# Patient Record
Sex: Female | Born: 1940
Health system: Southern US, Community
[De-identification: ages and names within clinical notes are randomized; demographics above are authoritative.]

## PROBLEM LIST (undated history)

## (undated) DIAGNOSIS — T8859XA Other complications of anesthesia, initial encounter: Secondary | ICD-10-CM

## (undated) DIAGNOSIS — M797 Fibromyalgia: Secondary | ICD-10-CM

## (undated) DIAGNOSIS — M1990 Unspecified osteoarthritis, unspecified site: Secondary | ICD-10-CM

## (undated) DIAGNOSIS — E119 Type 2 diabetes mellitus without complications: Secondary | ICD-10-CM

## (undated) DIAGNOSIS — E039 Hypothyroidism, unspecified: Secondary | ICD-10-CM

## (undated) DIAGNOSIS — R112 Nausea with vomiting, unspecified: Secondary | ICD-10-CM

## (undated) DIAGNOSIS — Z9889 Other specified postprocedural states: Secondary | ICD-10-CM

## (undated) DIAGNOSIS — C801 Malignant (primary) neoplasm, unspecified: Secondary | ICD-10-CM

## (undated) DIAGNOSIS — E7800 Pure hypercholesterolemia, unspecified: Secondary | ICD-10-CM

## (undated) HISTORY — PX: BACK SURGERY: SHX140

## (undated) HISTORY — PX: CHOLECYSTECTOMY: SHX55

## (undated) HISTORY — PX: TUBAL LIGATION: SHX77

## (undated) HISTORY — PX: LAPAROSCOPIC UNILATERAL SALPINGO OOPHERECTOMY: SHX5935

---

## 1997-09-24 ENCOUNTER — Ambulatory Visit (HOSPITAL_COMMUNITY): Admission: RE | Admit: 1997-09-24 | Discharge: 1997-09-24 | Payer: Self-pay | Admitting: Obstetrics and Gynecology

## 1998-02-19 ENCOUNTER — Other Ambulatory Visit: Admission: RE | Admit: 1998-02-19 | Discharge: 1998-02-19 | Payer: Self-pay | Admitting: Obstetrics and Gynecology

## 1998-08-26 ENCOUNTER — Ambulatory Visit (HOSPITAL_COMMUNITY): Admission: RE | Admit: 1998-08-26 | Discharge: 1998-08-26 | Payer: Self-pay | Admitting: Gastroenterology

## 1999-04-14 ENCOUNTER — Other Ambulatory Visit: Admission: RE | Admit: 1999-04-14 | Discharge: 1999-04-14 | Payer: Self-pay | Admitting: Obstetrics and Gynecology

## 1999-11-10 ENCOUNTER — Encounter: Admission: RE | Admit: 1999-11-10 | Discharge: 1999-11-10 | Payer: Self-pay | Admitting: Family Medicine

## 1999-11-10 ENCOUNTER — Encounter: Payer: Self-pay | Admitting: Family Medicine

## 1999-11-25 ENCOUNTER — Encounter: Admission: RE | Admit: 1999-11-25 | Discharge: 1999-11-25 | Payer: Self-pay | Admitting: Family Medicine

## 1999-11-25 ENCOUNTER — Encounter: Payer: Self-pay | Admitting: Family Medicine

## 1999-12-04 ENCOUNTER — Encounter: Admission: RE | Admit: 1999-12-04 | Discharge: 1999-12-04 | Payer: Self-pay | Admitting: Family Medicine

## 1999-12-04 ENCOUNTER — Encounter: Payer: Self-pay | Admitting: Family Medicine

## 2000-04-13 ENCOUNTER — Other Ambulatory Visit: Admission: RE | Admit: 2000-04-13 | Discharge: 2000-04-13 | Payer: Self-pay | Admitting: Obstetrics and Gynecology

## 2000-04-28 ENCOUNTER — Encounter: Admission: RE | Admit: 2000-04-28 | Discharge: 2000-04-28 | Payer: Self-pay | Admitting: Family Medicine

## 2000-04-28 ENCOUNTER — Encounter: Payer: Self-pay | Admitting: Family Medicine

## 2000-06-08 ENCOUNTER — Encounter: Payer: Self-pay | Admitting: Orthopedic Surgery

## 2000-06-08 ENCOUNTER — Encounter: Admission: RE | Admit: 2000-06-08 | Discharge: 2000-06-08 | Payer: Self-pay | Admitting: Orthopedic Surgery

## 2001-07-22 ENCOUNTER — Encounter: Admission: RE | Admit: 2001-07-22 | Discharge: 2001-07-22 | Payer: Self-pay | Admitting: Otolaryngology

## 2001-07-22 ENCOUNTER — Encounter: Payer: Self-pay | Admitting: Otolaryngology

## 2002-02-23 ENCOUNTER — Encounter: Payer: Self-pay | Admitting: Obstetrics and Gynecology

## 2002-02-23 ENCOUNTER — Encounter: Admission: RE | Admit: 2002-02-23 | Discharge: 2002-02-23 | Payer: Self-pay | Admitting: Obstetrics and Gynecology

## 2002-03-21 ENCOUNTER — Other Ambulatory Visit: Admission: RE | Admit: 2002-03-21 | Discharge: 2002-03-21 | Payer: Self-pay | Admitting: Obstetrics and Gynecology

## 2002-04-03 ENCOUNTER — Encounter: Admission: RE | Admit: 2002-04-03 | Discharge: 2002-04-03 | Payer: Self-pay | Admitting: Obstetrics and Gynecology

## 2002-04-03 ENCOUNTER — Encounter: Payer: Self-pay | Admitting: Obstetrics and Gynecology

## 2003-07-11 ENCOUNTER — Other Ambulatory Visit: Admission: RE | Admit: 2003-07-11 | Discharge: 2003-07-11 | Payer: Self-pay | Admitting: Obstetrics and Gynecology

## 2003-07-19 ENCOUNTER — Encounter: Admission: RE | Admit: 2003-07-19 | Discharge: 2003-07-19 | Payer: Self-pay | Admitting: Obstetrics and Gynecology

## 2004-09-19 ENCOUNTER — Encounter (INDEPENDENT_AMBULATORY_CARE_PROVIDER_SITE_OTHER): Payer: Self-pay | Admitting: Specialist

## 2004-09-19 ENCOUNTER — Ambulatory Visit (HOSPITAL_COMMUNITY): Admission: RE | Admit: 2004-09-19 | Discharge: 2004-09-19 | Payer: Self-pay | Admitting: *Deleted

## 2005-03-30 ENCOUNTER — Encounter: Admission: RE | Admit: 2005-03-30 | Discharge: 2005-03-30 | Payer: Self-pay | Admitting: Family Medicine

## 2006-04-15 ENCOUNTER — Encounter: Admission: RE | Admit: 2006-04-15 | Discharge: 2006-04-15 | Payer: Self-pay | Admitting: Family Medicine

## 2006-08-06 ENCOUNTER — Encounter: Admission: RE | Admit: 2006-08-06 | Discharge: 2006-08-06 | Payer: Self-pay | Admitting: Family Medicine

## 2007-11-16 ENCOUNTER — Encounter: Admission: RE | Admit: 2007-11-16 | Discharge: 2007-11-16 | Payer: Self-pay | Admitting: Obstetrics and Gynecology

## 2008-08-08 ENCOUNTER — Encounter: Admission: RE | Admit: 2008-08-08 | Discharge: 2008-09-24 | Payer: Self-pay | Admitting: Orthopedic Surgery

## 2009-02-20 ENCOUNTER — Ambulatory Visit (HOSPITAL_BASED_OUTPATIENT_CLINIC_OR_DEPARTMENT_OTHER): Admission: RE | Admit: 2009-02-20 | Discharge: 2009-02-20 | Payer: Self-pay | Admitting: Orthopaedic Surgery

## 2009-02-20 ENCOUNTER — Ambulatory Visit: Payer: Self-pay | Admitting: Diagnostic Radiology

## 2009-06-08 ENCOUNTER — Encounter: Admission: RE | Admit: 2009-06-08 | Discharge: 2009-06-08 | Payer: Self-pay | Admitting: Orthopaedic Surgery

## 2009-08-20 ENCOUNTER — Encounter: Admission: RE | Admit: 2009-08-20 | Discharge: 2009-11-18 | Payer: Self-pay | Admitting: Orthopaedic Surgery

## 2010-01-22 ENCOUNTER — Encounter: Admission: RE | Admit: 2010-01-22 | Discharge: 2010-01-22 | Payer: Self-pay | Admitting: Obstetrics and Gynecology

## 2010-04-06 ENCOUNTER — Encounter: Payer: Self-pay | Admitting: Family Medicine

## 2010-08-01 NOTE — Op Note (Signed)
NAMEGENICE, Ashley NO.:  [id number]   MEDICAL RECORD NO.:  [id number]          PATIENT TYPE:  AMB   LOCATION:  DAY                          FACILITY:  [facility]   PHYSICIAN:  Vikki Ports, MDDATE OF BIRTH:  1940-11-18   DATE OF PROCEDURE:  09/19/2004  DATE OF DISCHARGE:                                 OPERATIVE REPORT   PREOPERATIVE DIAGNOSIS:  Symptomatic cholelithiasis with normal liver  function tests.   POSTOPERATIVE DIAGNOSIS:  Symptomatic cholelithiasis with normal liver  function tests.   OPERATION/PROCEDURE:  Laparoscopic cholecystectomy.   SURGEON:  Vikki Ports, M.D.   ASSISTANT:  Clovis Pu. Cornett, M.D.   ANESTHESIA:  General.   DESCRIPTION OF PROCEDURE:  The patient was taken to the operating room and  placed in the supine position. After adequate general anesthesia was induced  using the endotracheal tube, the abdomen was prepped and draped in the  normal sterile fashion.  Using a transverse infraumbilical incision, I  dissected down to the fascia.  The fascia was opened vertically.  An 0  Vicryl pursestring suture was placed around the fascial defect.  The Hasson  trocar was placed in the abdomen and the abdomen was insufflated with  continuous flow carbon dioxide.  Under direct visualization an 11 mm port  was placed in the subxiphoid region.  Two 5 mm ports were placed in the  right abdomen.  Gallbladder was identified and retracted cephalad.  Dissection at the neck of the gallbladder easily visualized the cystic duct  and its junction with the gallbladder and common duct.  It was triply  clipped and divided.  Cystic artery was dissected in a similar fashion  creating a good window posterior to it.  It was triply clipped and divided.  Gallbladder was taken off the gallbladder bed using Bovie electrocautery and  removed through the umbilical port. Adequate hemostasis was assured.  The  fascia was closed  with an 0 Vicryl  pursestring suture.  Skin incisions were  closed with subcuticular 4-0 Monocryl. Steri-Strips and sterile dressings  were applied.  The patient tolerated the procedure well and went to PACU in  good condition.       KRH/MEDQ  D:  09/19/2004  T:  09/19/2004  Job:  [id number]

## 2010-12-13 ENCOUNTER — Observation Stay (HOSPITAL_COMMUNITY)
Admission: EM | Admit: 2010-12-13 | Discharge: 2010-12-13 | Disposition: A | Discharge status: Home / Self Care | Payer: Medicare Other | Attending: Internal Medicine | Admitting: Internal Medicine

## 2010-12-13 ENCOUNTER — Emergency Department (HOSPITAL_COMMUNITY): Payer: Medicare Other

## 2010-12-13 DIAGNOSIS — Z79899 Other long term (current) drug therapy: Secondary | ICD-10-CM | POA: Insufficient documentation

## 2010-12-13 DIAGNOSIS — F411 Generalized anxiety disorder: Secondary | ICD-10-CM | POA: Insufficient documentation

## 2010-12-13 DIAGNOSIS — R079 Chest pain, unspecified: Secondary | ICD-10-CM | POA: Insufficient documentation

## 2010-12-13 DIAGNOSIS — R112 Nausea with vomiting, unspecified: Principal | ICD-10-CM | POA: Insufficient documentation

## 2010-12-13 DIAGNOSIS — E039 Hypothyroidism, unspecified: Secondary | ICD-10-CM | POA: Insufficient documentation

## 2010-12-13 DIAGNOSIS — E119 Type 2 diabetes mellitus without complications: Secondary | ICD-10-CM | POA: Insufficient documentation

## 2010-12-13 DIAGNOSIS — A088 Other specified intestinal infections: Secondary | ICD-10-CM | POA: Insufficient documentation

## 2010-12-13 DIAGNOSIS — R002 Palpitations: Secondary | ICD-10-CM | POA: Insufficient documentation

## 2010-12-13 DIAGNOSIS — E785 Hyperlipidemia, unspecified: Secondary | ICD-10-CM | POA: Insufficient documentation

## 2010-12-13 DIAGNOSIS — K219 Gastro-esophageal reflux disease without esophagitis: Secondary | ICD-10-CM | POA: Insufficient documentation

## 2010-12-13 LAB — GLUCOSE, CAPILLARY
Glucose-Capillary: 165 mg/dL — ABNORMAL HIGH (ref 70–99)
Glucose-Capillary: 127 mg/dL — ABNORMAL HIGH (ref 70–99)
Glucose-Capillary: 126 mg/dL — ABNORMAL HIGH (ref 70–99)

## 2010-12-13 LAB — HEPATIC FUNCTION PANEL
AST: 31 U/L (ref 0–37)
Albumin: 4 g/dL (ref 3.5–5.2)
Total Protein: 7.1 g/dL (ref 6.0–8.3)

## 2010-12-13 LAB — POCT I-STAT, CHEM 8
BUN: 16 mg/dL (ref 6–23)
HCT: 39 % (ref 36.0–46.0)
Sodium: 136 mEq/L (ref 135–145)
TCO2: 22 mmol/L (ref 0–100)

## 2010-12-13 LAB — DIFFERENTIAL
Basophils Absolute: 0 10*3/uL (ref 0.0–0.1)
Basophils Relative: 0 % (ref 0–1)
Eosinophils Absolute: 0 10*3/uL (ref 0.0–0.7)
Eosinophils Relative: 0 % (ref 0–5)
Lymphocytes Relative: 16 % (ref 12–46)

## 2010-12-13 LAB — URINE MICROSCOPIC-ADD ON

## 2010-12-13 LAB — URINALYSIS, ROUTINE W REFLEX MICROSCOPIC
Ketones, ur: >80 mg/dL — AB
Protein, ur: NEGATIVE mg/dL
Urobilinogen, UA: 0.2 mg/dL (ref 0.0–1.0)

## 2010-12-13 LAB — CK TOTAL AND CKMB (NOT AT ARMC)
CK, MB: 9.3 ng/mL (ref 0.3–4.0)
CK, MB: 8.1 ng/mL (ref 0.3–4.0)
Relative Index: 3 — ABNORMAL HIGH (ref 0.0–2.5)
Relative Index: 2.9 — ABNORMAL HIGH (ref 0.0–2.5)
Total CK: 336 U/L — ABNORMAL HIGH (ref 7–177)
Total CK: 263 U/L — ABNORMAL HIGH (ref 7–177)

## 2010-12-13 LAB — LIPASE, BLOOD: Lipase: 18 U/L (ref 11–59)

## 2010-12-13 LAB — CBC
HCT: 38.6 % (ref 36.0–46.0)
MCHC: 35 g/dL (ref 30.0–36.0)
Platelets: 212 10*3/uL (ref 150–400)
RDW: 12.5 % (ref 11.5–15.5)
WBC: 7.6 10*3/uL (ref 4.0–10.5)

## 2010-12-13 LAB — POCT I-STAT TROPONIN I: Troponin i, poc: 0 ng/mL (ref 0.00–0.08)

## 2010-12-13 LAB — TROPONIN I: Troponin I: <0.3 ng/mL (ref <0.30)

## 2010-12-14 LAB — URINE CULTURE: Culture  Setup Time: 201209291128

## 2010-12-21 NOTE — H&P (Signed)
NAMEMarland Kitchen  Ashley Perkins, [name]:  [id number]  MEDICAL RECORD NO.:  [id number]  LOCATION:  WLED                         FACILITY:  [facility]  PHYSICIAN:  Hollice Espy, M.D.DATE OF BIRTH:  01-Aug-1940  DATE OF ADMISSION:  12/13/2010 DATE OF DISCHARGE:                             HISTORY & PHYSICAL   ATTENDING PHYSICIAN:  Hollice Espy, M.D.  PCP:  Duncan Dull, M.D.  CHIEF COMPLAINT:  Nausea, vomiting.  HISTORY OF PRESENT ILLNESS:  The patient is a 70 year old white female with past medical history of hypothyroidism and diabetes mellitus type 2 who was in her usual state of health when in the evening of December 12, 2010, when she sat down for dinner and she had a couple of bites, immediately started not feeling well.  She said she felt sense of nausea, diaphoresis, abdominal discomfort and she had brief episode of chest tightness.  She felt like her heart was racing.  Her symptoms persisted.  She could not get the nausea sensation to cease.  She even tried to make herself threw up and even when after she did so, her symptoms persisted.  She came to the emergency room to be evaluated. Chest x-ray, EKG, lab work was all unremarkable, but because of her symptoms and other risk factors, it was felt best that she be come in for observation to rule out any type of cardiac issue.  When I saw the patient, she was feeling better.  She said her nausea had passed, she felt more tired than anything else.  She denied any headaches, vision changes, dysphagia.  No current chest pain, palpitations, shortness of breath, wheeze, cough, abdominal pain, hematuria, dysuria, constipation, diarrhea, focal sign, numbness, weakness, or pain on review of systems currently, otherwise negative.  PAST MEDICAL HISTORY:  Includes 1. Hypothyroidism. 2. Hypertension. 3. Hyperlipidemia.  MEDICATIONS:  She is on: 1. Synthroid 50. 2. Metformin 1000 in the morning, 500 in the  evening. 3. Glipizide 5 mg p.o. daily. 4. She is on Crestor. 5. She may be on one other medication she cannot remember.  ALLERGIES:  She has no known drug allergies.  SOCIAL HISTORY:  She denies any tobacco, rarely drinks, no drug use.  FAMILY HISTORY:  Noted for dementia.  PHYSICAL EXAM:  VITAL SIGNS:  The patient's vitals when she came in to the emergency room; temperature 97.7, blood pressure initially 163/92, then down to 127/72, respirations 16, pulse 74, O2 sat 100% on room air. GENERAL:  She is alert and oriented x3, in no apparent distress. HEENT:  Normocephalic, atraumatic.  Mucous membranes are slightly dry. She has no carotid bruits. HEART:  Regular rate and rhythm.  S1 and S2. LUNGS:  Clear to auscultation bilaterally. ABDOMEN:  Soft, slightly distended, nontender.  Hypoactive bowel sounds. EXTREMITIES:  Show no clubbing or cyanosis, trace pitting edema.  LAB WORK:  CPK 336 and MB is 9.3, troponin-I 0.0.  Sodium 136, potassium 3.2, chloride 101, bicarb 22, BUN 16, creatinine 0.9, glucose 177. White count 7.6, 79% neutrophils, H and H 13.5 and 39, MCV 91, platelet count 212.  Transaminases are normal.  Lipase is normal. Urinalysis notes greater than 80 of ketones,  small leukocytes.  There are only 3-6 white cells and rare bacteria.  EKG shows essentially normal sinus rhythm with borderline prolonged QT.  ASSESSMENT AND PLAN: 1. Nausea, vomiting, some chest tightness.  It is possible this could     have been a passing stomach gastroenteritis.  However given her     age, diabetes, and the fact that she is woman, concerning to rule     out any type of acute coronary syndrome.  We will plan to bringing     to the hospital for observation, check 3 sets total of cardiac     markers for a set has already been negative, keep her on Telemetry.     If  she is feeling better and she has no abnormal lab work, we will     plan to discharge this evening after her third set of  enzymes. 2. Diabetes mellitus.  Continue metformin.  We will hold on glipizide     at this time and add a sliding scale. 3. Hypothyroidism.  Continue Synthroid.  All diagnoses are present on admission.     Hollice Espy, M.D.     SKK/MEDQ  D:  12/13/2010  T:  12/13/2010  Job:  [id number]  cc:   Duncan Dull, M.D. Fax: [phone]  Electronically Signed by Virginia Rochester M.D. on 12/21/2010 05:33:26 PM

## 2010-12-21 NOTE — Discharge Summary (Signed)
  Ashley Perkins, [name]:  [id number]  MEDICAL RECORD NO.:  [id number]  LOCATION:  1407                         FACILITY:  [facility]  PHYSICIAN:  Hollice Espy, M.D.DATE OF BIRTH:  12/05/1940  DATE OF ADMISSION:  12/13/2010 DATE OF DISCHARGE:  12/13/2010                              DISCHARGE SUMMARY   ATTENDING PHYSICIAN:  Hollice Espy, M.D.  PRIMARY CARE PHYSICIAN:  Duncan Dull, M.D.  DISCHARGE DIAGNOSES: 1. Viral gastroenteritis. 2. Gastroesophageal reflux disease. 3. History diabetes. 4. History of hyperlipidemia. 5. History of hypothyroidism. 6. History of anxiety.  DISCHARGE MEDICATIONS:  The patient will continue on all of her previous medications. 1. Amitriptyline 50 p.o. q.h.s. 2. Cranberry tab, 1 tab p.o. daily. 3. Flexeril 10 p.o. q.h.s. 4. Glipizide 10 mg 6 hours daily. 5. Synthroid 50 mcg p.o. daily. 6. Lipitor 20 mg p.o. daily. 7. Metformin 1000 in the morning, 500 in the evening. 8. Multivitamin p.o. daily. 9. Xanax 0.5 p.o. q.h.s.  NEW MEDICINES: 1. Prilosec over-the-counter 1 p.o. daily. 2. Maalox 30 cc every 6 hours as needed.  HOSPITAL COURSE:  The patient is a 70 year old white female with history of hypothyroidism and diabetes, who was in her usual state of health when she was found on the evening of September 28 of having problems with severe nausea, vomiting, and forceful retching.  She may have some chest tightness, but now she feels like it was more likely burning from all the stomach acid.  She came into the emergency room to be evaluated. Chest x-ray was unremarkable.  Due to her history of diabetes, her age, and her concerns for atypical chest pain presentation in the woman, triad hospitalist were called for admission.  The patient was evaluated. An initial set of enzymes and EKG was unremarkable.  Rest of her labs were normal.  She was observed during the daytime of September 29.  A second set of cardiac  markers was unremarkable.  Plans for third set, which is due tonight at 6 p.m., September 29, this set is unremarkable and the patient will be discharged to home.  She will be discharged with an over-the-counter PPI recommendation as well as p.r.n. Maalox.  She will continue the rest of the medications.  She will follow up with her PCP, Dr. Shaune Pollack in the next 2-3 weeks as needed.  She was discharged in a diabetic diet. She was slowly increased on activity and her disposition is improved. She is being discharged to home.     Hollice Espy, M.D.     SKK/MEDQ  D:  12/13/2010  T:  12/13/2010  Job:  [id number]  cc:   Duncan Dull, M.D. Fax: [phone]  Electronically Signed by Virginia Rochester M.D. on 12/21/2010 05:33:24 PM

## 2011-01-01 ENCOUNTER — Other Ambulatory Visit: Payer: Self-pay | Admitting: Gastroenterology

## 2011-01-01 DIAGNOSIS — R112 Nausea with vomiting, unspecified: Secondary | ICD-10-CM

## 2011-01-05 ENCOUNTER — Ambulatory Visit
Admission: RE | Admit: 2011-01-05 | Discharge: 2011-01-05 | Disposition: A | Discharge status: Home / Self Care | Payer: Medicare Other | Source: Ambulatory Visit | Attending: Gastroenterology | Admitting: Gastroenterology

## 2011-01-05 DIAGNOSIS — R112 Nausea with vomiting, unspecified: Secondary | ICD-10-CM

## 2011-10-06 ENCOUNTER — Other Ambulatory Visit: Payer: Self-pay | Admitting: Gastroenterology

## 2012-07-06 ENCOUNTER — Ambulatory Visit: Payer: Medicare Other | Attending: Orthopaedic Surgery | Admitting: Rehabilitation

## 2012-07-06 DIAGNOSIS — IMO0001 Reserved for inherently not codable concepts without codable children: Secondary | ICD-10-CM | POA: Insufficient documentation

## 2012-07-06 DIAGNOSIS — M25579 Pain in unspecified ankle and joints of unspecified foot: Secondary | ICD-10-CM | POA: Insufficient documentation

## 2012-07-08 ENCOUNTER — Encounter (HOSPITAL_COMMUNITY): Payer: Self-pay | Admitting: *Deleted

## 2012-07-08 ENCOUNTER — Emergency Department (HOSPITAL_COMMUNITY)
Admission: EM | Admit: 2012-07-08 | Discharge: 2012-07-08 | Disposition: A | Discharge status: Home / Self Care | Payer: Medicare Other | Attending: Emergency Medicine | Admitting: Emergency Medicine

## 2012-07-08 ENCOUNTER — Emergency Department (HOSPITAL_COMMUNITY): Payer: Medicare Other

## 2012-07-08 DIAGNOSIS — E119 Type 2 diabetes mellitus without complications: Secondary | ICD-10-CM | POA: Insufficient documentation

## 2012-07-08 DIAGNOSIS — Z8639 Personal history of other endocrine, nutritional and metabolic disease: Secondary | ICD-10-CM | POA: Insufficient documentation

## 2012-07-08 DIAGNOSIS — Z8739 Personal history of other diseases of the musculoskeletal system and connective tissue: Secondary | ICD-10-CM | POA: Insufficient documentation

## 2012-07-08 DIAGNOSIS — E860 Dehydration: Secondary | ICD-10-CM

## 2012-07-08 DIAGNOSIS — Z862 Personal history of diseases of the blood and blood-forming organs and certain disorders involving the immune mechanism: Secondary | ICD-10-CM | POA: Insufficient documentation

## 2012-07-08 DIAGNOSIS — R112 Nausea with vomiting, unspecified: Secondary | ICD-10-CM | POA: Insufficient documentation

## 2012-07-08 HISTORY — DX: Type 2 diabetes mellitus without complications: E11.9

## 2012-07-08 HISTORY — DX: Fibromyalgia: M79.7

## 2012-07-08 HISTORY — DX: Pure hypercholesterolemia, unspecified: E78.00

## 2012-07-08 LAB — CK TOTAL AND CKMB (NOT AT ARMC): Total CK: 137 U/L (ref 7–177)

## 2012-07-08 LAB — CBC WITH DIFFERENTIAL/PLATELET
Basophils Absolute: 0 10*3/uL (ref 0.0–0.1)
Eosinophils Absolute: 0.1 10*3/uL (ref 0.0–0.7)
Eosinophils Relative: 1 % (ref 0–5)
HCT: 38.6 % (ref 36.0–46.0)
Lymphocytes Relative: 20 % (ref 12–46)
Lymphs Abs: 1.9 10*3/uL (ref 0.7–4.0)
MCH: 31.3 pg (ref 26.0–34.0)
MCV: 90.2 fL (ref 78.0–100.0)
Monocytes Absolute: 0.4 10*3/uL (ref 0.1–1.0)
RDW: 12.9 % (ref 11.5–15.5)
WBC: 9.5 10*3/uL (ref 4.0–10.5)

## 2012-07-08 LAB — POCT I-STAT TROPONIN I
Troponin i, poc: 0.01 ng/mL (ref 0.00–0.08)
Troponin i, poc: 0 ng/mL (ref 0.00–0.08)

## 2012-07-08 LAB — COMPREHENSIVE METABOLIC PANEL
ALT: 19 U/L (ref 0–35)
Alkaline Phosphatase: 92 U/L (ref 39–117)
CO2: 28 mEq/L (ref 19–32)
Calcium: 9.5 mg/dL (ref 8.4–10.5)
Creatinine, Ser: 0.87 mg/dL (ref 0.50–1.10)
Glucose, Bld: 231 mg/dL — ABNORMAL HIGH (ref 70–99)
Total Bilirubin: 0.5 mg/dL (ref 0.3–1.2)

## 2012-07-08 MED ORDER — ONDANSETRON HCL 4 MG PO TABS: 4.0000 mg | ORAL_TABLET | Freq: Four times a day (QID) | ORAL | Status: DC

## 2012-07-08 MED ORDER — ONDANSETRON HCL 4 MG/2ML IJ SOLN
4.0000 mg | Freq: Once | INTRAMUSCULAR | Status: AC
  Administered 2012-07-08: 4 mg via INTRAVENOUS
  Filled 2012-07-08: qty 2

## 2012-07-08 MED ORDER — ONDANSETRON 8 MG PO TBDP
8.0000 mg | ORAL_TABLET | Freq: Once | ORAL | Status: AC
  Administered 2012-07-08: 8 mg via ORAL
  Filled 2012-07-08: qty 1

## 2012-07-08 MED ORDER — SODIUM CHLORIDE 0.9 % IV BOLUS (SEPSIS)
1000.0000 mL | Freq: Once | INTRAVENOUS | Status: AC
  Administered 2012-07-08: 1000 mL via INTRAVENOUS

## 2012-07-08 NOTE — ED Notes (Signed)
Pt given PO fluids and tolerated well. MD informed.

## 2012-07-08 NOTE — ED Provider Notes (Signed)
History     CSN: [id number]  Arrival date & time 07/08/12  0120   First MD Initiated Contact with Patient 07/08/12 [phone]      Chief Complaint  Patient presents with  . Emesis    (Consider location/radiation/quality/duration/timing/severity/associated sxs/prior treatment) HPI Hx per PT - N/V onset tonight, had been working in the yard for 8 hours today and had not eaten anything, when she went to have dinner developed upset stomach with N/V multiple epidsodes. No ABD pain, no CP or SOB. She had similar symptoms a few years ago under similar circumstances and ended up being discharge from the ED. No known h/o CAD. Symptoms MOD in severity  Past Medical History  Diagnosis Date  . Diabetes mellitus without complication   . Fibromyalgia   . Hypercholesteremia     Past Surgical History  Procedure Laterality Date  . Cholecystectomy    . Back surgery    . Knee arthroscopy    . Laparoscopic unilateral salpingo oopherectomy    . Tubal ligation      No family history on file.  History  Substance Use Topics  . Smoking status: Not on file  . Smokeless tobacco: Not on file  . Alcohol Use: No    OB History   Grav Para Term Preterm Abortions TAB SAB Ect Mult Living                  Review of Systems  Constitutional: Negative for fever and chills.  HENT: Negative for neck pain and neck stiffness.   Eyes: Negative for pain.  Respiratory: Negative for shortness of breath.   Cardiovascular: Negative for chest pain.  Gastrointestinal: Positive for nausea and vomiting. Negative for abdominal pain.  Genitourinary: Negative for dysuria.  Musculoskeletal: Negative for back pain.  Skin: Negative for rash.  Neurological: Negative for headaches.  All other systems reviewed and are negative.    Allergies  Review of patient's allergies indicates no known allergies.  Home Medications  No current outpatient prescriptions on file.  BP 147/74  Pulse 81  Temp(Src) 97.5 F (36.4  C)  Resp 20  SpO2 100%  Physical Exam  Constitutional: She is oriented to person, place, and time. She appears well-developed and well-nourished.  HENT:  Head: Normocephalic and atraumatic.  Mouth/Throat: No oropharyngeal exudate.  Dry mm  Eyes: EOM are normal. Pupils are equal, round, and reactive to light. No scleral icterus.  Neck: Neck supple.  Cardiovascular: Normal rate, regular rhythm and intact distal pulses.   Pulmonary/Chest: Effort normal and breath sounds normal. No respiratory distress.  Abdominal: Soft. Bowel sounds are normal. She exhibits no distension. There is no tenderness. There is no rebound and no guarding.  Musculoskeletal: Normal range of motion. She exhibits no edema.  Neurological: She is alert and oriented to person, place, and time.  Skin: Skin is warm and dry.    ED Course  Procedures (including critical care time)  Results for orders placed during the hospital encounter of 07/08/12  CBC WITH DIFFERENTIAL      Result Value Range   WBC 9.5  4.0 - 10.5 K/uL   RBC 4.28  3.87 - 5.11 MIL/uL   Hemoglobin 13.4  12.0 - 15.0 g/dL   HCT 62.1  30.8 - 65.7 %   MCV 90.2  78.0 - 100.0 fL   MCH 31.3  26.0 - 34.0 pg   MCHC 34.7  30.0 - 36.0 g/dL   RDW 84.6  96.2 - 95.2 %  Platelets 212  150 - 400 K/uL   Neutrophils Relative 75  43 - 77 %   Neutro Abs 7.2  1.7 - 7.7 K/uL   Lymphocytes Relative 20  12 - 46 %   Lymphs Abs 1.9  0.7 - 4.0 K/uL   Monocytes Relative 4  3 - 12 %   Monocytes Absolute 0.4  0.1 - 1.0 K/uL   Eosinophils Relative 1  0 - 5 %   Eosinophils Absolute 0.1  0.0 - 0.7 K/uL   Basophils Relative 0  0 - 1 %   Basophils Absolute 0.0  0.0 - 0.1 K/uL  COMPREHENSIVE METABOLIC PANEL      Result Value Range   Sodium 135  135 - 145 mEq/L   Potassium 3.8  3.5 - 5.1 mEq/L   Chloride 96  96 - 112 mEq/L   CO2 28  19 - 32 mEq/L   Glucose, Bld 231 (*) 70 - 99 mg/dL   BUN 17  6 - 23 mg/dL   Creatinine, Ser 1.61  0.50 - 1.10 mg/dL   Calcium 9.5  8.4 -  09.6 mg/dL   Total Protein 6.9  6.0 - 8.3 g/dL   Albumin 3.9  3.5 - 5.2 g/dL   AST 26  0 - 37 U/L   ALT 19  0 - 35 U/L   Alkaline Phosphatase 92  39 - 117 U/L   Total Bilirubin 0.5  0.3 - 1.2 mg/dL   GFR calc non Af Amer 65 (*) >90 mL/min   GFR calc Af Amer 75 (*) >90 mL/min  CK TOTAL AND CKMB      Result Value Range   Total CK 137  7 - 177 U/L   CK, MB 4.6 (*) 0.3 - 4.0 ng/mL   Relative Index 3.4 (*) 0.0 - 2.5  POCT I-STAT TROPONIN I      Result Value Range   Troponin i, poc 0.01  0.00 - 0.08 ng/mL   Comment 3           POCT I-STAT TROPONIN I      Result Value Range   Troponin i, poc 0.00  0.00 - 0.08 ng/mL   Comment 3            Dg Chest 2 View  07/08/2012  *RADIOLOGY REPORT*  Clinical Data: Follow up on portable radiograph  CHEST - 2 VIEW  Comparison: 07/08/2012  Findings: Upper lobe nodularity not seen in the apical lordotic projection, therefore corresponded to superimposed artifact.  No confluent airspace opacity.  No pleural effusion or pneumothorax. Heart size within upper normal limits.  Mild aortic tortuosity.  No acute osseous finding.  IMPRESSION: No radiographic evidence of acute cardiopulmonary process.  Nodule questioned on portable radiograph corresponds to artifact.   Original Report Authenticated By: Jearld Lesch, M.D.    Dg Chest Portable 1 View  07/08/2012  *RADIOLOGY REPORT*  Clinical Data: Emesis  PORTABLE CHEST - 1 VIEW  Comparison: 12/13/2010  Findings: Lungs predominately clear. 7 mm nodular density projecting over the right lung apex.  Aortic tortuosity.  Heart size upper normal.  No pleural effusion or pneumothorax.  No acute osseous finding.  IMPRESSION: No radiographic evidence of acute cardiopulmonary process.  7 mm nodular density projecting over the right lung apex may be artifact from superimposed structures or a lung nodule.  Consider follow-up PA and lateral radiographs to include an apical lordotic view.   Original Report Authenticated By: Jearld Lesch, M.D.  Date: 07/08/2012  Rate: 74  Rhythm: normal sinus rhythm  QRS Axis: normal  Intervals: normal  ST/T Wave abnormalities: nonspecific ST changes  Conduction Disutrbances:none  Narrative Interpretation: NSR QTc 488  Old EKG Reviewed: unchanged  IVFs, zofran  6:25 AM tolerating PO fluids, no emesis in ED, feels much better and wants to go home. Plan RX Zofran, PCP follow up, strict return precautions provided and verbalized as understood   MDM  N/V with dehydration - improved with IVfs and zofran  Evaluated with ECG, CXR, serial troponins  Old records, VS and nursing notes reviewed        Sunnie Nielsen, MD 07/08/12 [phone]

## 2012-07-08 NOTE — ED Notes (Signed)
Pt states worked all day in yard; felt like over did herself; c/o nausea/emesis; previous history of same two years ago and states cardiac enzymes were elevated at that time; requesting enzymes to be checked due to similar symptoms; took old rx of phenergan four hours pta with no relief from nausea

## 2012-07-11 ENCOUNTER — Ambulatory Visit: Payer: Medicare Other | Admitting: Rehabilitation

## 2012-07-13 ENCOUNTER — Ambulatory Visit: Payer: Medicare Other | Admitting: Rehabilitation

## 2012-07-18 ENCOUNTER — Ambulatory Visit: Payer: Medicare Other | Attending: Orthopaedic Surgery | Admitting: Rehabilitation

## 2012-07-18 DIAGNOSIS — IMO0001 Reserved for inherently not codable concepts without codable children: Secondary | ICD-10-CM | POA: Insufficient documentation

## 2012-07-18 DIAGNOSIS — M25579 Pain in unspecified ankle and joints of unspecified foot: Secondary | ICD-10-CM | POA: Insufficient documentation

## 2012-07-20 ENCOUNTER — Ambulatory Visit: Payer: Medicare Other | Admitting: Rehabilitation

## 2012-07-25 ENCOUNTER — Ambulatory Visit: Payer: Medicare Other | Admitting: Rehabilitation

## 2012-07-27 ENCOUNTER — Ambulatory Visit: Payer: Medicare Other | Admitting: Rehabilitation

## 2012-08-01 ENCOUNTER — Ambulatory Visit: Payer: Medicare Other | Admitting: Rehabilitation

## 2012-08-03 ENCOUNTER — Ambulatory Visit: Payer: Medicare Other | Admitting: Rehabilitation

## 2012-12-07 ENCOUNTER — Encounter: Payer: Self-pay | Admitting: *Deleted

## 2012-12-07 ENCOUNTER — Encounter: Payer: Medicare Other | Attending: Family Medicine | Admitting: *Deleted

## 2012-12-07 VITALS — Ht 68.0 in | Wt 190.1 lb

## 2012-12-07 DIAGNOSIS — Z713 Dietary counseling and surveillance: Secondary | ICD-10-CM | POA: Insufficient documentation

## 2012-12-07 DIAGNOSIS — E119 Type 2 diabetes mellitus without complications: Secondary | ICD-10-CM
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
## 2012-12-21 ENCOUNTER — Other Ambulatory Visit: Payer: Self-pay | Admitting: Family Medicine

## 2012-12-21 DIAGNOSIS — E2839 Other primary ovarian failure: Secondary | ICD-10-CM

## 2012-12-21 DIAGNOSIS — Z1231 Encounter for screening mammogram for malignant neoplasm of breast: Secondary | ICD-10-CM

## 2013-01-18 ENCOUNTER — Encounter: Payer: Self-pay | Admitting: *Deleted

## 2013-01-18 ENCOUNTER — Encounter: Payer: Medicare Other | Attending: Family Medicine | Admitting: *Deleted

## 2013-01-18 VITALS — Ht 68.0 in | Wt 191.6 lb

## 2013-01-18 DIAGNOSIS — Z713 Dietary counseling and surveillance: Secondary | ICD-10-CM | POA: Insufficient documentation

## 2013-01-18 DIAGNOSIS — E119 Type 2 diabetes mellitus without complications: Secondary | ICD-10-CM

## 2013-01-18 NOTE — Progress Notes (Signed)
DIABETES follow up: Patient presents for follow up related to her diabetes. Upon walking into my office she started talking non stop about psycho-social issues related to herself and her family. I attempt to redirect to diabetes management which lasts for only a short time. Conversation reverts back to psycho-social issues. " I know what's wrong with me"  "I just don't know what to do about it" Glucose log: Patient realizes that she has been using two different glucometers and readings on log are inconsistent. I have provided her with a personal specific log.  PLAN 1. Test and log glucose 2. Meet with Kennith Center NP @ [facility] and discuss Depression and modification of medications 3. Seek out psychologist to address depression and other psychosocial concerns 4.Go to bed at 11:00 and attempt to go to sleep 5. Get up in the morning and enjoy your private time. 6. Do what is good for you.. not necessarily what someone else wants you to do 7. Use only one glucometer for consistency.  8. Return for follow up in January

## 2013-01-23 ENCOUNTER — Ambulatory Visit
Admission: RE | Admit: 2013-01-23 | Discharge: 2013-01-23 | Disposition: A | Discharge status: Home / Self Care | Payer: Medicare Other | Source: Ambulatory Visit | Attending: Family Medicine | Admitting: Family Medicine

## 2013-01-23 DIAGNOSIS — E2839 Other primary ovarian failure: Secondary | ICD-10-CM

## 2013-01-23 DIAGNOSIS — Z1231 Encounter for screening mammogram for malignant neoplasm of breast: Secondary | ICD-10-CM

## 2013-01-24 ENCOUNTER — Other Ambulatory Visit: Payer: Self-pay | Admitting: Family Medicine

## 2013-01-24 DIAGNOSIS — R928 Other abnormal and inconclusive findings on diagnostic imaging of breast: Secondary | ICD-10-CM

## 2013-02-13 ENCOUNTER — Ambulatory Visit
Admission: RE | Admit: 2013-02-13 | Discharge: 2013-02-13 | Disposition: A | Discharge status: Home / Self Care | Payer: Medicare Other | Source: Ambulatory Visit | Attending: Family Medicine | Admitting: Family Medicine

## 2013-02-13 DIAGNOSIS — R928 Other abnormal and inconclusive findings on diagnostic imaging of breast: Secondary | ICD-10-CM

## 2013-03-29 ENCOUNTER — Ambulatory Visit: Payer: Medicare Other | Admitting: *Deleted

## 2014-05-06 ENCOUNTER — Inpatient Hospital Stay (HOSPITAL_BASED_OUTPATIENT_CLINIC_OR_DEPARTMENT_OTHER)
Admission: EM | Admit: 2014-05-06 | Discharge: 2014-05-09 | DRG: 440 | Disposition: A | Discharge status: Home / Self Care | Payer: Commercial Managed Care - HMO | Attending: Internal Medicine | Admitting: Internal Medicine

## 2014-05-06 ENCOUNTER — Encounter (HOSPITAL_BASED_OUTPATIENT_CLINIC_OR_DEPARTMENT_OTHER): Payer: Self-pay | Admitting: *Deleted

## 2014-05-06 DIAGNOSIS — E039 Hypothyroidism, unspecified: Secondary | ICD-10-CM | POA: Diagnosis present

## 2014-05-06 DIAGNOSIS — Z8711 Personal history of peptic ulcer disease: Secondary | ICD-10-CM

## 2014-05-06 DIAGNOSIS — R1013 Epigastric pain: Secondary | ICD-10-CM | POA: Diagnosis not present

## 2014-05-06 DIAGNOSIS — Z791 Long term (current) use of non-steroidal anti-inflammatories (NSAID): Secondary | ICD-10-CM

## 2014-05-06 DIAGNOSIS — F32A Depression, unspecified: Secondary | ICD-10-CM | POA: Insufficient documentation

## 2014-05-06 DIAGNOSIS — R7401 Elevation of levels of liver transaminase levels: Secondary | ICD-10-CM | POA: Diagnosis present

## 2014-05-06 DIAGNOSIS — K859 Acute pancreatitis without necrosis or infection, unspecified: Secondary | ICD-10-CM | POA: Diagnosis present

## 2014-05-06 DIAGNOSIS — R1084 Generalized abdominal pain: Secondary | ICD-10-CM | POA: Insufficient documentation

## 2014-05-06 DIAGNOSIS — G8929 Other chronic pain: Secondary | ICD-10-CM | POA: Diagnosis present

## 2014-05-06 DIAGNOSIS — F329 Major depressive disorder, single episode, unspecified: Secondary | ICD-10-CM | POA: Insufficient documentation

## 2014-05-06 DIAGNOSIS — R7989 Other specified abnormal findings of blood chemistry: Secondary | ICD-10-CM | POA: Insufficient documentation

## 2014-05-06 DIAGNOSIS — R740 Nonspecific elevation of levels of transaminase and lactic acid dehydrogenase [LDH]: Secondary | ICD-10-CM

## 2014-05-06 DIAGNOSIS — E119 Type 2 diabetes mellitus without complications: Secondary | ICD-10-CM | POA: Diagnosis present

## 2014-05-06 DIAGNOSIS — R945 Abnormal results of liver function studies: Secondary | ICD-10-CM

## 2014-05-06 DIAGNOSIS — M797 Fibromyalgia: Secondary | ICD-10-CM | POA: Diagnosis present

## 2014-05-06 DIAGNOSIS — E785 Hyperlipidemia, unspecified: Secondary | ICD-10-CM | POA: Diagnosis present

## 2014-05-06 DIAGNOSIS — E876 Hypokalemia: Secondary | ICD-10-CM | POA: Diagnosis not present

## 2014-05-06 LAB — CBC WITH DIFFERENTIAL/PLATELET
BASOS ABS: 0 10*3/uL (ref 0.0–0.1)
Basophils Relative: 0 % (ref 0–1)
Eosinophils Absolute: 0.2 10*3/uL (ref 0.0–0.7)
Eosinophils Relative: 2 % (ref 0–5)
HCT: 41 % (ref 36.0–46.0)
Hemoglobin: 13.8 g/dL (ref 12.0–15.0)
LYMPHS ABS: 2.5 10*3/uL (ref 0.7–4.0)
LYMPHS PCT: 24 % (ref 12–46)
MCH: 31.3 pg (ref 26.0–34.0)
MCHC: 33.7 g/dL (ref 30.0–36.0)
MCV: 93 fL (ref 78.0–100.0)
MONO ABS: 0.7 10*3/uL (ref 0.1–1.0)
MONOS PCT: 7 % (ref 3–12)
NEUTROS ABS: 6.9 10*3/uL (ref 1.7–7.7)
Neutrophils Relative %: 67 % (ref 43–77)
PLATELETS: 244 10*3/uL (ref 150–400)
RBC: 4.41 MIL/uL (ref 3.87–5.11)
RDW: 12.9 % (ref 11.5–15.5)
WBC: 10.4 10*3/uL (ref 4.0–10.5)

## 2014-05-06 LAB — TROPONIN I: Troponin I: <0.03 ng/mL (ref <0.031)

## 2014-05-06 MED ORDER — FENTANYL CITRATE 0.05 MG/ML IJ SOLN
50.0000 ug | Freq: Once | INTRAMUSCULAR | Status: AC
  Administered 2014-05-06: 50 ug via INTRAVENOUS
  Filled 2014-05-06: qty 2

## 2014-05-06 MED ORDER — PANTOPRAZOLE SODIUM 40 MG PO TBEC
40.0000 mg | DELAYED_RELEASE_TABLET | Freq: Once | ORAL | Status: AC
  Administered 2014-05-07: 40 mg via ORAL
  Filled 2014-05-06: qty 1

## 2014-05-06 MED ORDER — GI COCKTAIL ~~LOC~~
30.0000 mL | Freq: Once | ORAL | Status: AC
  Administered 2014-05-07: 30 mL via ORAL
  Filled 2014-05-06: qty 30

## 2014-05-06 MED ORDER — ONDANSETRON HCL 4 MG/2ML IJ SOLN
4.0000 mg | Freq: Once | INTRAMUSCULAR | Status: AC
  Administered 2014-05-06: 4 mg via INTRAVENOUS
  Filled 2014-05-06: qty 2

## 2014-05-06 NOTE — ED Notes (Signed)
Per pt and spouse - pt has been experiencing severe upper sharp abd pain that began approx 19min pta - pt denies any n/v/d. Pt in acute distress d/t pain - diaphoretic.

## 2014-05-06 NOTE — ED Provider Notes (Signed)
TIME SEEN: 11:40 PM  CHIEF COMPLAINT: Epigastric abdominal pain, nausea  HPI: Ashley Perkins is a 74 y.o. female with history of diabetes, hyperlipidemia, fibromyalgia who presents emergency department with complaints of epigastric abdominal pain that started this evening while at home at rest.  Pain radiates into her lower chest. She has had nausea but no vomiting or diarrhea. No fevers or chills. No shortness of breath. Has never had similar pain in the past. Is status post cholecystectomy and right sided salpingo-oophorectomy, tubal ligation. States she does take NSAIDs regularly for chronic pain and has had a peptic ulcer in the past but has been many years ago and she is unsure if this feels similar. Denies bloody stool or melena. Denies history of pancreatitis. Does not drink alcohol.  ROS: See HPI Constitutional: no fever  Eyes: no drainage  ENT: no runny nose   Cardiovascular:   chest pain  Resp: no SOB  GI: no vomiting GU: no dysuria Integumentary: no rash  Allergy: no hives  Musculoskeletal: no leg swelling  Neurological: no slurred speech ROS otherwise negative  PAST MEDICAL HISTORY/PAST SURGICAL HISTORY:  Past Medical History  Diagnosis Date  . Diabetes mellitus without complication   . Fibromyalgia   . Hypercholesteremia     MEDICATIONS:  Prior to Admission medications   Medication Sig Start Date End Date Taking? Authorizing Provider  amitriptyline (ELAVIL) 50 MG tablet Take 50 mg by mouth at bedtime.    Historical Provider, MD  B Complex Vitamins (VITAMIN B COMPLEX PO) Take 1 tablet by mouth daily.    Historical Provider, MD  Cyanocobalamin (VITAMIN B 12 PO) Take 1 tablet by mouth daily.    Historical Provider, MD  glipiZIDE (GLUCOTROL XL) 10 MG 24 hr tablet Take 10 mg by mouth daily.    Historical Provider, MD  levothyroxine (SYNTHROID, LEVOTHROID) 75 MCG tablet Take 75 mcg by mouth daily before breakfast.    Historical Provider, MD  metFORMIN (GLUCOPHAGE) 500 MG tablet Take  1,000 mg by mouth 2 (two) times daily with a meal.    Historical Provider, MD  Multiple Vitamin (MULTIVITAMIN WITH MINERALS) TABS Take 1 tablet by mouth daily.    Historical Provider, MD  ondansetron (ZOFRAN) 4 MG tablet Take 1 tablet (4 mg total) by mouth every 6 (six) hours. 07/08/12   Teressa Lower, MD  traMADol (ULTRAM) 50 MG tablet Take 50 mg by mouth every 6 (six) hours as needed for pain.    Historical Provider, MD    ALLERGIES:  No Known Allergies  SOCIAL HISTORY:  History  Substance Use Topics  . Smoking status: Never Smoker   . Smokeless tobacco: Not on file  . Alcohol Use: No    FAMILY HISTORY: History reviewed. No pertinent family history.  EXAM: BP 183/88 mmHg  Pulse 84  Temp(Src) 97.8 F (36.6 C) (Oral)  Resp 22  Ht 5\' 8"  (1.727 m)  Wt 189 lb (85.73 kg)  BMI 28.74 kg/m2  SpO2 100% CONSTITUTIONAL: Alert and oriented and responds appropriately to questions. Well-appearing; well-nourished, nontoxic but does appear uncomfortable HEAD: Normocephalic EYES: Conjunctivae clear, PERRL ENT: normal nose; no rhinorrhea; moist mucous membranes; pharynx without lesions noted NECK: Supple, no meningismus, no LAD  CARD: RRR; S1 and S2 appreciated; no murmurs, no clicks, no rubs, no gallops RESP: Normal chest excursion without splinting or tachypnea; breath sounds clear and equal bilaterally; no wheezes, no rhonchi, no rales ABD/GI: Normal bowel sounds; non-distended; soft, tender palpation throughout the epigastric region without guarding or rebound,  no peritoneal signs BACK:  The back appears normal and is non-tender to palpation, there is no CVA tenderness EXT: Normal ROM in all joints; non-tender to palpation; no edema; normal capillary refill; no cyanosis    SKIN: Normal color for age and race; warm NEURO: Moves all extremities equally PSYCH: The patient's mood and manner are appropriate. Grooming and personal hygiene are appropriate.  MEDICAL DECISION MAKING: Patient here  with epigastric abdominal pain. EKG shows no ischemic changes and is unchanged compared to prior EKGs. Will obtain abdominal labs, troponin, acute abdominal series. Patient feeling better after IV fentanyl and Zofran. We'll keep nothing by mouth and give IV fluids.  ED PROGRESS: Patient's labs show lipase of 1055. She does have mild elevation of her AST and ALT. Unable to perform ultrasound at this time at night. We'll obtain CT scan to evaluate for complications of pancreatitis, gallstone pancreatitis. No leukocytosis. No fever. Troponin negative. Abdominal x-ray shows normal gas pattern and no free air under the diaphragm.    Patient's CT scan is unremarkable other than moderate colonic stool. She is post cholecystectomy without biliary dilatation. There are no acute findings of acute pancreatitis but this may be because symptoms only started several hours ago. Patient has received morphine which has improved her pain but her pain quickly returns. We'll give third dose of narcotic medicine but this time I feel she will need admission for IV hydration, bowel rest and pain control. Patient agrees with this plan. She would like to be admitted to [facility] PCP is with [facility] physicians.  2:50 AM  D/w Dr. Alcario Drought hospitalists service who agrees on admission to a medical bed, inpatient at [facility].   EKG Interpretation  Date/Time:  Sunday May 06 2014 23:18:14 EST Ventricular Rate:  72 PR Interval:  174 QRS Duration: 86 QT Interval:  412 QTC Calculation: 451 R Axis:   5 Text Interpretation:  Normal sinus rhythm Possible Left atrial enlargement Septal infarct , age undetermined Abnormal ECG No significant change since last tracing Confirmed by Setsuko Robins,  DO, Severus Brodzinski [phone]) on 05/06/2014 11:37:25 PM        [place], DO 05/07/14 9622

## 2014-05-07 ENCOUNTER — Inpatient Hospital Stay (HOSPITAL_COMMUNITY): Payer: Commercial Managed Care - HMO

## 2014-05-07 ENCOUNTER — Emergency Department (HOSPITAL_BASED_OUTPATIENT_CLINIC_OR_DEPARTMENT_OTHER): Payer: Commercial Managed Care - HMO

## 2014-05-07 ENCOUNTER — Encounter (HOSPITAL_COMMUNITY): Payer: Self-pay | Admitting: *Deleted

## 2014-05-07 DIAGNOSIS — E785 Hyperlipidemia, unspecified: Secondary | ICD-10-CM | POA: Diagnosis present

## 2014-05-07 DIAGNOSIS — K859 Acute pancreatitis without necrosis or infection, unspecified: Secondary | ICD-10-CM | POA: Diagnosis present

## 2014-05-07 DIAGNOSIS — R1013 Epigastric pain: Secondary | ICD-10-CM | POA: Diagnosis present

## 2014-05-07 DIAGNOSIS — R740 Nonspecific elevation of levels of transaminase and lactic acid dehydrogenase [LDH]: Secondary | ICD-10-CM

## 2014-05-07 DIAGNOSIS — G8929 Other chronic pain: Secondary | ICD-10-CM | POA: Diagnosis present

## 2014-05-07 DIAGNOSIS — Z791 Long term (current) use of non-steroidal anti-inflammatories (NSAID): Secondary | ICD-10-CM | POA: Diagnosis not present

## 2014-05-07 DIAGNOSIS — R7401 Elevation of levels of liver transaminase levels: Secondary | ICD-10-CM | POA: Diagnosis present

## 2014-05-07 DIAGNOSIS — K850 Idiopathic acute pancreatitis: Secondary | ICD-10-CM

## 2014-05-07 DIAGNOSIS — E119 Type 2 diabetes mellitus without complications: Secondary | ICD-10-CM | POA: Diagnosis present

## 2014-05-07 DIAGNOSIS — E876 Hypokalemia: Secondary | ICD-10-CM | POA: Diagnosis not present

## 2014-05-07 DIAGNOSIS — Z8711 Personal history of peptic ulcer disease: Secondary | ICD-10-CM | POA: Diagnosis not present

## 2014-05-07 DIAGNOSIS — E039 Hypothyroidism, unspecified: Secondary | ICD-10-CM | POA: Diagnosis present

## 2014-05-07 DIAGNOSIS — M797 Fibromyalgia: Secondary | ICD-10-CM | POA: Diagnosis present

## 2014-05-07 LAB — COMPREHENSIVE METABOLIC PANEL
ALT: 46 U/L — ABNORMAL HIGH (ref 0–35)
AST: 129 U/L — ABNORMAL HIGH (ref 0–37)
Albumin: 4.1 g/dL (ref 3.5–5.2)
Alkaline Phosphatase: 113 U/L (ref 39–117)
Anion gap: 6 (ref 5–15)
BUN: 15 mg/dL (ref 6–23)
CO2: 28 mmol/L (ref 19–32)
Calcium: 8.7 mg/dL (ref 8.4–10.5)
Chloride: 100 mmol/L (ref 96–112)
Creatinine, Ser: 0.85 mg/dL (ref 0.50–1.10)
GFR calc Af Amer: 76 mL/min — ABNORMAL LOW (ref >90)
GFR calc non Af Amer: 66 mL/min — ABNORMAL LOW (ref >90)
Glucose, Bld: 157 mg/dL — ABNORMAL HIGH (ref 70–99)
Potassium: 3.3 mmol/L — ABNORMAL LOW (ref 3.5–5.1)
Sodium: 134 mmol/L — ABNORMAL LOW (ref 135–145)
Total Bilirubin: 0.5 mg/dL (ref 0.3–1.2)
Total Protein: 7.3 g/dL (ref 6.0–8.3)

## 2014-05-07 LAB — GLUCOSE, CAPILLARY
GLUCOSE-CAPILLARY: 179 mg/dL — AB (ref 70–99)
GLUCOSE-CAPILLARY: 163 mg/dL — AB (ref 70–99)
Glucose-Capillary: 118 mg/dL — ABNORMAL HIGH (ref 70–99)
Glucose-Capillary: 117 mg/dL — ABNORMAL HIGH (ref 70–99)
Glucose-Capillary: 119 mg/dL — ABNORMAL HIGH (ref 70–99)

## 2014-05-07 LAB — LIPASE, BLOOD
Lipase: 1055 U/L — ABNORMAL HIGH (ref 11–59)
Lipase: 792 U/L — ABNORMAL HIGH (ref 11–59)

## 2014-05-07 MED ORDER — AMITRIPTYLINE HCL 50 MG PO TABS
50.0000 mg | ORAL_TABLET | Freq: Every day | ORAL | Status: DC
  Administered 2014-05-07: 50 mg via ORAL
  Filled 2014-05-07: qty 1

## 2014-05-07 MED ORDER — INSULIN ASPART 100 UNIT/ML ~~LOC~~ SOLN
0.0000 [IU] | SUBCUTANEOUS | Status: DC
  Administered 2014-05-07: 2 [IU] via SUBCUTANEOUS

## 2014-05-07 MED ORDER — POTASSIUM CHLORIDE 10 MEQ/100ML IV SOLN
10.0000 meq | INTRAVENOUS | Status: AC
  Administered 2014-05-07 (×3): 10 meq via INTRAVENOUS
  Filled 2014-05-07 (×3): qty 100

## 2014-05-07 MED ORDER — MORPHINE SULFATE 4 MG/ML IJ SOLN
4.0000 mg | Freq: Once | INTRAMUSCULAR | Status: AC
  Administered 2014-05-07: 4 mg via INTRAVENOUS
  Filled 2014-05-07: qty 1

## 2014-05-07 MED ORDER — HEPARIN SODIUM (PORCINE) 5000 UNIT/ML IJ SOLN
5000.0000 [IU] | Freq: Three times a day (TID) | INTRAMUSCULAR | Status: DC
  Administered 2014-05-07 – 2014-05-09 (×7): 5000 [IU] via SUBCUTANEOUS
  Filled 2014-05-07 (×7): qty 1

## 2014-05-07 MED ORDER — SODIUM CHLORIDE 0.9 % IV BOLUS (SEPSIS)
1000.0000 mL | Freq: Once | INTRAVENOUS | Status: AC
  Administered 2014-05-07: 1000 mL via INTRAVENOUS

## 2014-05-07 MED ORDER — LEVOTHYROXINE SODIUM 25 MCG PO TABS
75.0000 ug | ORAL_TABLET | Freq: Every day | ORAL | Status: DC
  Administered 2014-05-07 – 2014-05-09 (×3): 75 ug via ORAL
  Filled 2014-05-07 (×5): qty 1

## 2014-05-07 MED ORDER — IOHEXOL 300 MG/ML  SOLN
100.0000 mL | Freq: Once | INTRAMUSCULAR | Status: AC | PRN
  Administered 2014-05-07: 100 mL via INTRAVENOUS

## 2014-05-07 MED ORDER — ACETAMINOPHEN 325 MG PO TABS
650.0000 mg | ORAL_TABLET | Freq: Four times a day (QID) | ORAL | Status: DC | PRN
  Administered 2014-05-07: 650 mg via ORAL
  Filled 2014-05-07: qty 2

## 2014-05-07 MED ORDER — MORPHINE SULFATE 2 MG/ML IJ SOLN: 2.0000 mg | INTRAMUSCULAR | Status: DC | PRN

## 2014-05-07 MED ORDER — POLYETHYLENE GLYCOL 3350 17 G PO PACK
17.0000 g | PACK | Freq: Every day | ORAL | Status: DC
Start: 2014-05-07 — End: 2014-05-09
  Administered 2014-05-07 – 2014-05-09 (×3): 17 g via ORAL
  Filled 2014-05-07 (×4): qty 1

## 2014-05-07 MED ORDER — ONDANSETRON HCL 4 MG/2ML IJ SOLN
4.0000 mg | Freq: Four times a day (QID) | INTRAMUSCULAR | Status: DC | PRN
  Administered 2014-05-07: 4 mg via INTRAVENOUS
  Filled 2014-05-07: qty 2

## 2014-05-07 MED ORDER — SODIUM CHLORIDE 0.9 % IV SOLN
INTRAVENOUS | Status: AC
  Administered 2014-05-07 (×2): via INTRAVENOUS

## 2014-05-07 MED ORDER — IOHEXOL 300 MG/ML  SOLN
25.0000 mL | Freq: Once | INTRAMUSCULAR | Status: AC | PRN
  Administered 2014-05-07: 25 mL via ORAL

## 2014-05-07 MED ORDER — INSULIN ASPART 100 UNIT/ML ~~LOC~~ SOLN: 0.0000 [IU] | Freq: Every day | SUBCUTANEOUS | Status: DC

## 2014-05-07 MED ORDER — ONDANSETRON HCL 4 MG PO TABS: 4.0000 mg | ORAL_TABLET | Freq: Four times a day (QID) | ORAL | Status: DC

## 2014-05-07 MED ORDER — WHITE PETROLATUM GEL
Status: AC
  Administered 2014-05-07: 0.2
  Filled 2014-05-07: qty 1

## 2014-05-07 MED ORDER — INSULIN ASPART 100 UNIT/ML ~~LOC~~ SOLN
0.0000 [IU] | Freq: Three times a day (TID) | SUBCUTANEOUS | Status: DC
  Administered 2014-05-07: 2 [IU] via SUBCUTANEOUS
  Administered 2014-05-08: 1 [IU] via SUBCUTANEOUS
  Administered 2014-05-08 (×2): 2 [IU] via SUBCUTANEOUS
  Administered 2014-05-09: 3 [IU] via SUBCUTANEOUS
  Administered 2014-05-09: 1 [IU] via SUBCUTANEOUS
  Administered 2014-05-09: 2 [IU] via SUBCUTANEOUS

## 2014-05-07 NOTE — Progress Notes (Signed)
Patient admitted after midnight, please see H&p.  Pain better, no nausea  1. Acute pancreatitis - 1. Bowel rest, npo except meds- until after U/S 2. IVF 3. Pain control with morphine 4. Abdominal US 2. Transaminitis - mild at this point 1. Repeat CMP tomorrow 3. DM2 1. Hold home meds 2. Low dose SSI while NPO, CBG checks Q4H  4.  Moderate volume constipation -had BM yest before ER -miralax when taking clears   Ashley Perkins

## 2014-05-07 NOTE — ED Notes (Signed)
Pt to CT

## 2014-05-07 NOTE — ED Notes (Addendum)
Dr. Leonides Schanz at [facility], plan for admission and transfer discussed, pt alert, NAD, calm, interactive, reports pain has returned. IVF bolus continues. Husband at [facility]. VSS.

## 2014-05-07 NOTE — ED Notes (Signed)
Pt up to b/r with steady gait, NAD, calm, "feel better".

## 2014-05-07 NOTE — ED Notes (Signed)
Out with [facility]

## 2014-05-07 NOTE — H&P (Signed)
[facility] History and Physical  Ashley Perkins JJK:[id number] DOB: Jan 18, 1941 DOA: 05/06/2014  Referring physician: EDP PCP: Pcp Not In System   Chief Complaint: Abdominal pain   HPI: Ashley Perkins is a 74 y.o. female who presents to the ED at [facility] with sudden onset acute abdominal pain.  She was watching TV, her cat jumped on her stomach, she sat forwards and had severe abdominal pain.  Pain is located in her epigastric area, was severe, radiating to lower chest.  Nausea, but no vomiting nor diarrhea.  No fever nor chills.  No SOB.  Never had a similar event in past.  Presents to ED for symptoms.  Review of Systems: Systems reviewed.  As above, otherwise negative  Past Medical History  Diagnosis Date  . Diabetes mellitus without complication   . Fibromyalgia   . Hypercholesteremia    Past Surgical History  Procedure Laterality Date  . Cholecystectomy    . Back surgery    . Knee arthroscopy    . Laparoscopic unilateral salpingo oopherectomy    . Tubal ligation     Social History:  reports that she has never smoked. She does not have any smokeless tobacco history on file. She reports that she does not drink alcohol. Her drug history is not on file.  No Known Allergies  History reviewed. No pertinent family history.   Prior to Admission medications   Medication Sig Start Date End Date Taking? Authorizing Provider  amitriptyline (ELAVIL) 50 MG tablet Take 50 mg by mouth at bedtime.    Historical Provider, MD  B Complex Vitamins (VITAMIN B COMPLEX PO) Take 1 tablet by mouth daily.    Historical Provider, MD  Cyanocobalamin (VITAMIN B 12 PO) Take 1 tablet by mouth daily.    Historical Provider, MD  glipiZIDE (GLUCOTROL XL) 10 MG 24 hr tablet Take 10 mg by mouth daily.    Historical Provider, MD  levothyroxine (SYNTHROID, LEVOTHROID) 75 MCG tablet Take 75 mcg by mouth daily before breakfast.    Historical Provider, MD  metFORMIN (GLUCOPHAGE) 500 MG tablet Take 1,000 mg  by mouth 2 (two) times daily with a meal.    Historical Provider, MD  Multiple Vitamin (MULTIVITAMIN WITH MINERALS) TABS Take 1 tablet by mouth daily.    Historical Provider, MD  ondansetron (ZOFRAN) 4 MG tablet Take 1 tablet (4 mg total) by mouth every 6 (six) hours. 07/08/12   Teressa Lower, MD  traMADol (ULTRAM) 50 MG tablet Take 50 mg by mouth every 6 (six) hours as needed for pain.    Historical Provider, MD   Physical Exam: Filed Vitals:   05/07/14 0410  BP: 140/77  Pulse: 83  Temp: 98.2 F (36.8 C)  Resp: 12    BP 140/77 mmHg  Pulse 83  Temp(Src) 98.2 F (36.8 C) (Oral)  Resp 12  Ht 5' 7.99" (1.727 m)  Wt 86.546 kg (190 lb 12.8 oz)  BMI 29.02 kg/m2  SpO2 94%  General Appearance:    Alert, oriented, no distress, appears stated age  Head:    Normocephalic, atraumatic  Eyes:    PERRL, EOMI, sclera non-icteric        Nose:   Nares without drainage or epistaxis. Mucosa, turbinates normal  Throat:   Moist mucous membranes. Oropharynx without erythema or exudate.  Neck:   Supple. No carotid bruits.  No thyromegaly.  No lymphadenopathy.   Back:     No CVA tenderness, no spinal tenderness  Lungs:  Clear to auscultation bilaterally, without wheezes, rhonchi or rales  Chest wall:    No tenderness to palpitation  Heart:    Regular rate and rhythm without murmurs, gallops, rubs  Abdomen:     Soft, TTP in epigastric area, nondistended, normal bowel sounds, no organomegaly  Genitalia:    deferred  Rectal:    deferred  Extremities:   No clubbing, cyanosis or edema.  Pulses:   2+ and symmetric all extremities  Skin:   Skin color, texture, turgor normal, no rashes or lesions  Lymph nodes:   Cervical, supraclavicular, and axillary nodes normal  Neurologic:   CNII-XII intact. Normal strength, sensation and reflexes      throughout    Labs on Admission:  Basic Metabolic Panel:  Recent Labs Lab 05/06/14 2325  NA 134*  K 3.3*  CL 100  CO2 28  GLUCOSE 157*  BUN 15   CREATININE 0.85  CALCIUM 8.7   Liver Function Tests:  Recent Labs Lab 05/06/14 2325  AST 129*  ALT 46*  ALKPHOS 113  BILITOT 0.5  PROT 7.3  ALBUMIN 4.1    Recent Labs Lab 05/06/14 2325  LIPASE 1055*   No results for input(s): AMMONIA in the last 168 hours. CBC:  Recent Labs Lab 05/06/14 2325  WBC 10.4  NEUTROABS 6.9  HGB 13.8  HCT 41.0  MCV 93.0  PLT 244   Cardiac Enzymes:  Recent Labs Lab 05/06/14 2325  TROPONINI <0.03    BNP (last 3 results) No results for input(s): PROBNP in the last 8760 hours. CBG: No results for input(s): GLUCAP in the last 168 hours.  Radiological Exams on Admission: Ct Abdomen Pelvis W Contrast  05/07/2014   CLINICAL DATA:  Severe upper abdominal pain. Elevated lipase, without prior history of pancreatitis.  EXAM: CT ABDOMEN AND PELVIS WITH CONTRAST  TECHNIQUE: Multidetector CT imaging of the abdomen and pelvis was performed using the standard protocol following bolus administration of intravenous contrast.  CONTRAST:  24mL OMNIPAQUE IOHEXOL 300 MG/ML SOLN, 117mL OMNIPAQUE IOHEXOL 300 MG/ML SOLN  COMPARISON:  Radiographs earlier this day.  FINDINGS: The included lung bases are clear.  The pancreas is normal without peripancreatic inflammatory change or pancreatic ductal dilatation. No focal pancreatic lesion.  Clips in the gallbladder fossa from cholecystectomy. No CT findings of biliary dilatation. No focal hepatic lesion. The spleen and adrenal glands are normal. Kidneys demonstrate symmetric enhancement and excretion. No hydronephrosis or localizing abnormality.  Stomach is distended with ingested contrast. There is no small bowel dilatation. Moderate volume of stool is seen throughout the entire colon. The cecum is high-riding. The appendix is normal. No free air, free fluid, or intra-abdominal fluid collection.  The abdominal aorta is normal in caliber, tortuous in its course. There is no retroperitoneal adenopathy.  Within the pelvis  the bladder is distended. The uterus and adnexa are normal for age. There is no pelvic free fluid.  Mild degenerative change in the lumbar spine. Geographic 11 mm sclerotic density in the right iliac bone, no aggressive characteristics.  IMPRESSION: 1. No CT findings of acute pancreatitis or focal pancreatic abnormality. Clinical findings of pancreatitis can precede CT abnormalities. 2. Moderate volume of colonic stool which can be seen in the setting of constipation. 3. Post cholecystectomy without biliary dilatation.   Electronically Signed   By: Jeb Levering M.D.   On: 05/07/2014 02:14   Dg Abd Acute W/chest  05/07/2014   CLINICAL DATA:  Severe sharp upper abdominal pain beginning 45 min  prior to admission. Diaphoretic.  EXAM: ACUTE ABDOMEN SERIES (ABDOMEN 2 VIEW & CHEST 1 VIEW)  COMPARISON:  Chest 07/08/2012.  Abdomen 12/13/2010  FINDINGS: Normal heart size and pulmonary vascularity. No focal airspace disease or consolidation in the lungs. No blunting of costophrenic angles. No pneumothorax. Mediastinal contours appear intact.  Stool-filled colon without distention. There is a single gas-filled bowel loop in the right abdomen which appears represent colon. This does not appear abnormally distended. No small bowel distention. No free intra-abdominal air. No abnormal air-fluid levels. Surgical clips in the right upper quadrant. No radiopaque stones. Visualized bones appear intact.  IMPRESSION: No evidence of active pulmonary disease. Nonobstructive bowel gas pattern.   Electronically Signed   By: Lucienne Capers M.D.   On: 05/07/2014 00:41    EKG: Independently reviewed.  Assessment/Plan Principal Problem:   Acute pancreatitis Active Problems:   Transaminitis   DM2 (diabetes mellitus, type 2)   1. Acute pancreatitis - 1. Bowel rest, npo except meds 2. IVF 3. Pain control with morphine 4. Abdominal US 2. Transaminitis - mild at this point 1. Repeat CMP tomorrow 3. DM2 1. Hold home  meds 2. Low dose SSI while NPO, CBG checks Q4H    Code Status: Full  Family Communication: No family in room Disposition Plan: Admit to inpatient   Time spent: 36 min  GARDNER, JARED M. [facility] Pager [phone]  If 7AM-7PM, please contact the day team taking care of the patient [email or web address] Password [facility] 05/07/2014, 5:51 AM

## 2014-05-08 LAB — LIPID PANEL
Cholesterol: 202 mg/dL — ABNORMAL HIGH (ref 0–200)
HDL: 51 mg/dL (ref >39)
LDL CALC: 120 mg/dL — AB (ref 0–99)
Total CHOL/HDL Ratio: 4 RATIO
Triglycerides: 156 mg/dL — ABNORMAL HIGH (ref <150)
VLDL: 31 mg/dL (ref 0–40)

## 2014-05-08 LAB — COMPREHENSIVE METABOLIC PANEL
ALBUMIN: 3.7 g/dL (ref 3.5–5.2)
ALT: 156 U/L — ABNORMAL HIGH (ref 0–35)
ANION GAP: 3 — AB (ref 5–15)
AST: 139 U/L — ABNORMAL HIGH (ref 0–37)
Alkaline Phosphatase: 173 U/L — ABNORMAL HIGH (ref 39–117)
BUN: <5 mg/dL — ABNORMAL LOW (ref 6–23)
CALCIUM: 9.3 mg/dL (ref 8.4–10.5)
CO2: 31 mmol/L (ref 19–32)
Chloride: 105 mmol/L (ref 96–112)
Creatinine, Ser: 0.8 mg/dL (ref 0.50–1.10)
GFR calc Af Amer: 82 mL/min — ABNORMAL LOW (ref >90)
GFR calc non Af Amer: 71 mL/min — ABNORMAL LOW (ref >90)
GLUCOSE: 157 mg/dL — AB (ref 70–99)
Potassium: 4.3 mmol/L (ref 3.5–5.1)
SODIUM: 139 mmol/L (ref 135–145)
TOTAL PROTEIN: 6.2 g/dL (ref 6.0–8.3)
Total Bilirubin: 0.6 mg/dL (ref 0.3–1.2)

## 2014-05-08 LAB — GLUCOSE, CAPILLARY
GLUCOSE-CAPILLARY: 196 mg/dL — AB (ref 70–99)
Glucose-Capillary: 160 mg/dL — ABNORMAL HIGH (ref 70–99)
Glucose-Capillary: 132 mg/dL — ABNORMAL HIGH (ref 70–99)
Glucose-Capillary: 176 mg/dL — ABNORMAL HIGH (ref 70–99)

## 2014-05-08 LAB — CBC
HEMATOCRIT: 39.7 % (ref 36.0–46.0)
Hemoglobin: 13.2 g/dL (ref 12.0–15.0)
MCH: 30.7 pg (ref 26.0–34.0)
MCHC: 33.2 g/dL (ref 30.0–36.0)
MCV: 92.3 fL (ref 78.0–100.0)
Platelets: 228 10*3/uL (ref 150–400)
RBC: 4.3 MIL/uL (ref 3.87–5.11)
RDW: 13.6 % (ref 11.5–15.5)
WBC: 8.7 10*3/uL (ref 4.0–10.5)

## 2014-05-08 LAB — LIPASE, BLOOD: Lipase: 46 U/L (ref 11–59)

## 2014-05-08 MED ORDER — LEVOTHYROXINE SODIUM 88 MCG PO TABS: 88.0000 ug | ORAL_TABLET | Freq: Every day | ORAL | Status: DC

## 2014-05-08 MED ORDER — TRAMADOL HCL 50 MG PO TABS
50.0000 mg | ORAL_TABLET | Freq: Every day | ORAL | Status: DC
  Administered 2014-05-08: 50 mg via ORAL
  Filled 2014-05-08: qty 1

## 2014-05-08 MED ORDER — AMITRIPTYLINE HCL 50 MG PO TABS: 50.0000 mg | ORAL_TABLET | Freq: Every day | ORAL | Status: DC

## 2014-05-08 NOTE — Care Management (Signed)
05-08-14 Important message from Medicare given to patient . Niyanna Asch RN BSN         

## 2014-05-08 NOTE — Progress Notes (Signed)
[facility] PROGRESS NOTE  Ashley Perkins VZD:[id number] DOB: 04/24/1940 DOA: 05/06/2014 PCP: Pcp Not In System  Brief Narrative 74 year old female with history of diabetes mellitus, hyperlipidemia and fibromyalgia admitted with acute abdominal pain with markedly elevated lipase greater than thousand and transaminitis.  Assessment/Plan: Acute pancreatitis with transaminitis No clear etiology. Ultrasound CT abdomen unremarkable. ? Possible choledocolithiasis. Lipase has normalized since admission. Supportive care with IV hydration, pain medications and antiemetics. No further abdominal pain nausea or vomiting. -Check hepatitis panel -Noted for mildly worsened LFTs this a.m. Recheck in the morning. If LFTs persistently elevated will need GI evaluation. -Advance diet to regular today.  Type 2 diabetes mellitus Monitor on sliding scale insulin. Home oral hypoglycemics.  Hypothyroidism Resume home dose Synthroid  fibromyalgia Resume bedtime tramadol. Will hold amitriptyline given transaminitis.  Hypokalemia Replenished  DVT prophylaxis Diet: Diabetic  Code Status: Full code Family Communication: Daughter at bedside Disposition Plan: Home once LFTs improve   Consultants:  none  Procedures:  none  Antibiotics:  none  HPI/Subjective: denies further abdominal pain, nausea or vomiting.  Objective: Filed Vitals:   05/08/14 1430  BP: 135/76  Pulse: 92  Temp: 98.4 F (36.9 C)  Resp: 16    Intake/Output Summary (Last 24 hours) at 05/08/14 1905 Last data filed at 05/08/14 1330  Gross per 24 hour  Intake   1020 ml  Output      0 ml  Net   1020 ml   Filed Weights   05/06/14 2304 05/07/14 0410  Weight: 85.73 kg (189 lb) 86.546 kg (190 lb 12.8 oz)    Exam:   General:  Elderly female in no acute distress  HEENT: No pallor, moist oral mucosa, no icterus  Chest: Clear to auscultation bilaterally  CVS: Normal S1 and S2 no murmurs  GI: Soft,  nondistended, nontender, no organomegaly, sounds present  Musculoskeletal:, No edema    Data Reviewed: Basic Metabolic Panel:  Recent Labs Lab 05/06/14 2325 05/08/14 0519  NA 134* 139  K 3.3* 4.3  CL 100 105  CO2 28 31  GLUCOSE 157* 157*  BUN 15 <5*  CREATININE 0.85 0.80  CALCIUM 8.7 9.3   Liver Function Tests:  Recent Labs Lab 05/06/14 2325 05/08/14 0519  AST 129* 139*  ALT 46* 156*  ALKPHOS 113 173*  BILITOT 0.5 0.6  PROT 7.3 6.2  ALBUMIN 4.1 3.7    Recent Labs Lab 05/06/14 2325 05/07/14 0930 05/08/14 0519  LIPASE 1055* 792* 46   No results for input(s): AMMONIA in the last 168 hours. CBC:  Recent Labs Lab 05/06/14 2325 05/08/14 0519  WBC 10.4 8.7  NEUTROABS 6.9  --   HGB 13.8 13.2  HCT 41.0 39.7  MCV 93.0 92.3  PLT 244 228   Cardiac Enzymes:  Recent Labs Lab 05/06/14 2325  TROPONINI <0.03   BNP (last 3 results) No results for input(s): BNP in the last 8760 hours.  ProBNP (last 3 results) No results for input(s): PROBNP in the last 8760 hours.  CBG:  Recent Labs Lab 05/07/14 1558 05/07/14 2142 05/08/14 0747 05/08/14 1123 05/08/14 1659  GLUCAP 163* 119* 160* 132* 196*    No results found for this or any previous visit (from the past 240 hour(s)).   Studies: US Abdomen Complete  05/07/2014   CLINICAL DATA:  Acute pancreatitis.  EXAM: ULTRASOUND ABDOMEN COMPLETE  COMPARISON:  05/07/2014  FINDINGS: Gallbladder: Prior cholecystectomy.  Common bile duct: Diameter: Normal caliber for post cholecystectomy state, 6-7 mm.  Liver:  Focal area of increased echotexture adjacent to the falciform ligament, most compatible with focal fatty infiltration. No biliary ductal dilatation or other focal abnormality.  IVC: No abnormality visualized.  Pancreas: Visualized portion unremarkable.  Spleen: Size and appearance within normal limits.  Right Kidney: Length: 10.2 cm. Echogenicity within normal limits. No mass or hydronephrosis visualized.  Left  Kidney: Length: 10.8 cm. Echogenicity within normal limits. No mass or hydronephrosis visualized.  Abdominal aorta: No aneurysm visualized.  Other findings: None.  IMPRESSION: No acute findings. Area of focal fatty infiltration suspected in the liver adjacent to the falciform ligament. Prior cholecystectomy.   Electronically Signed   By: Rolm Baptise M.D.   On: 05/07/2014 11:30   Ct Abdomen Pelvis W Contrast  05/07/2014   CLINICAL DATA:  Severe upper abdominal pain. Elevated lipase, without prior history of pancreatitis.  EXAM: CT ABDOMEN AND PELVIS WITH CONTRAST  TECHNIQUE: Multidetector CT imaging of the abdomen and pelvis was performed using the standard protocol following bolus administration of intravenous contrast.  CONTRAST:  70mL OMNIPAQUE IOHEXOL 300 MG/ML SOLN, 172mL OMNIPAQUE IOHEXOL 300 MG/ML SOLN  COMPARISON:  Radiographs earlier this day.  FINDINGS: The included lung bases are clear.  The pancreas is normal without peripancreatic inflammatory change or pancreatic ductal dilatation. No focal pancreatic lesion.  Clips in the gallbladder fossa from cholecystectomy. No CT findings of biliary dilatation. No focal hepatic lesion. The spleen and adrenal glands are normal. Kidneys demonstrate symmetric enhancement and excretion. No hydronephrosis or localizing abnormality.  Stomach is distended with ingested contrast. There is no small bowel dilatation. Moderate volume of stool is seen throughout the entire colon. The cecum is high-riding. The appendix is normal. No free air, free fluid, or intra-abdominal fluid collection.  The abdominal aorta is normal in caliber, tortuous in its course. There is no retroperitoneal adenopathy.  Within the pelvis the bladder is distended. The uterus and adnexa are normal for age. There is no pelvic free fluid.  Mild degenerative change in the lumbar spine. Geographic 11 mm sclerotic density in the right iliac bone, no aggressive characteristics.  IMPRESSION: 1. No CT  findings of acute pancreatitis or focal pancreatic abnormality. Clinical findings of pancreatitis can precede CT abnormalities. 2. Moderate volume of colonic stool which can be seen in the setting of constipation. 3. Post cholecystectomy without biliary dilatation.   Electronically Signed   By: Jeb Levering M.D.   On: 05/07/2014 02:14   Dg Abd Acute W/chest  05/07/2014   CLINICAL DATA:  Severe sharp upper abdominal pain beginning 45 min prior to admission. Diaphoretic.  EXAM: ACUTE ABDOMEN SERIES (ABDOMEN 2 VIEW & CHEST 1 VIEW)  COMPARISON:  Chest 07/08/2012.  Abdomen 12/13/2010  FINDINGS: Normal heart size and pulmonary vascularity. No focal airspace disease or consolidation in the lungs. No blunting of costophrenic angles. No pneumothorax. Mediastinal contours appear intact.  Stool-filled colon without distention. There is a single gas-filled bowel loop in the right abdomen which appears represent colon. This does not appear abnormally distended. No small bowel distention. No free intra-abdominal air. No abnormal air-fluid levels. Surgical clips in the right upper quadrant. No radiopaque stones. Visualized bones appear intact.  IMPRESSION: No evidence of active pulmonary disease. Nonobstructive bowel gas pattern.   Electronically Signed   By: Lucienne Capers M.D.   On: 05/07/2014 00:41    Scheduled Meds: . heparin  5,000 Units Subcutaneous 3 times per day  . insulin aspart  0-5 Units Subcutaneous QHS  . insulin aspart  0-9  Units Subcutaneous TID WC  . levothyroxine  75 mcg Oral QAC breakfast  . polyethylene glycol  17 g Oral Daily  . traMADol  50 mg Oral QHS   Continuous Infusions:      Time spent: [place], [place]  [facility] Pager [phone]. If 7PM-7AM, please contact night-coverage at [email or web address], password [facility] 05/08/2014, 7:05 PM  LOS: 1 day

## 2014-05-09 DIAGNOSIS — F329 Major depressive disorder, single episode, unspecified: Secondary | ICD-10-CM

## 2014-05-09 DIAGNOSIS — K858 Other acute pancreatitis: Secondary | ICD-10-CM

## 2014-05-09 DIAGNOSIS — K859 Acute pancreatitis, unspecified: Principal | ICD-10-CM

## 2014-05-09 DIAGNOSIS — R945 Abnormal results of liver function studies: Secondary | ICD-10-CM

## 2014-05-09 DIAGNOSIS — F32A Depression, unspecified: Secondary | ICD-10-CM | POA: Insufficient documentation

## 2014-05-09 DIAGNOSIS — E876 Hypokalemia: Secondary | ICD-10-CM

## 2014-05-09 DIAGNOSIS — E038 Other specified hypothyroidism: Secondary | ICD-10-CM

## 2014-05-09 DIAGNOSIS — R7989 Other specified abnormal findings of blood chemistry: Secondary | ICD-10-CM

## 2014-05-09 DIAGNOSIS — R1084 Generalized abdominal pain: Secondary | ICD-10-CM | POA: Insufficient documentation

## 2014-05-09 LAB — HEPATITIS PANEL, ACUTE
HCV AB: NEGATIVE
HEP A IGM: NONREACTIVE
HEP B C IGM: NONREACTIVE
Hepatitis B Surface Ag: NEGATIVE

## 2014-05-09 LAB — COMPREHENSIVE METABOLIC PANEL
ALT: 88 U/L — AB (ref 0–35)
AST: 42 U/L — ABNORMAL HIGH (ref 0–37)
Albumin: 3.3 g/dL — ABNORMAL LOW (ref 3.5–5.2)
Alkaline Phosphatase: 150 U/L — ABNORMAL HIGH (ref 39–117)
Anion gap: 5 (ref 5–15)
BUN: 9 mg/dL (ref 6–23)
CALCIUM: 9.1 mg/dL (ref 8.4–10.5)
CHLORIDE: 102 mmol/L (ref 96–112)
CO2: 31 mmol/L (ref 19–32)
CREATININE: 0.97 mg/dL (ref 0.50–1.10)
GFR, EST AFRICAN AMERICAN: 65 mL/min — AB (ref >90)
GFR, EST NON AFRICAN AMERICAN: 56 mL/min — AB (ref >90)
GLUCOSE: 179 mg/dL — AB (ref 70–99)
Potassium: 3.9 mmol/L (ref 3.5–5.1)
Sodium: 138 mmol/L (ref 135–145)
Total Bilirubin: 0.9 mg/dL (ref 0.3–1.2)
Total Protein: 6 g/dL (ref 6.0–8.3)

## 2014-05-09 LAB — LIPASE, BLOOD: Lipase: 34 U/L (ref 11–59)

## 2014-05-09 LAB — GLUCOSE, CAPILLARY
GLUCOSE-CAPILLARY: 141 mg/dL — AB (ref 70–99)
Glucose-Capillary: 179 mg/dL — ABNORMAL HIGH (ref 70–99)
Glucose-Capillary: 201 mg/dL — ABNORMAL HIGH (ref 70–99)

## 2014-05-09 MED ORDER — LORAZEPAM 1 MG PO TABS: 1.0000 mg | ORAL_TABLET | Freq: Every day | ORAL | Status: DC

## 2014-05-09 MED ORDER — GLIPIZIDE ER 10 MG PO TB24
10.0000 mg | ORAL_TABLET | Freq: Every day | ORAL | Status: DC
  Filled 2014-05-09 (×2): qty 1

## 2014-05-09 MED ORDER — AMITRIPTYLINE HCL 25 MG PO TABS
25.0000 mg | ORAL_TABLET | Freq: Every day | ORAL | Status: DC
Start: 2014-05-09 — End: 2017-10-18

## 2014-05-09 MED ORDER — ADULT MULTIVITAMIN W/MINERALS CH
1.0000 | ORAL_TABLET | Freq: Every day | ORAL | Status: DC
  Administered 2014-05-09: 1 via ORAL
  Filled 2014-05-09: qty 1

## 2014-05-09 MED ORDER — METFORMIN HCL 500 MG PO TABS: 1000.0000 mg | ORAL_TABLET | Freq: Two times a day (BID) | ORAL | Status: DC

## 2014-05-09 NOTE — Progress Notes (Addendum)
Patient ID: Ashley Perkins, female   DOB: 1940/06/10, 74 y.o.   MRN: [id number] [facility] PROGRESS NOTE  MALAN WERK WUJ:[id number] DOB: [birth year] DOA: 05/06/2014 PCP: Pcp Not In System  Brief narrative:    74 year old female with history of diabetes mellitus, hyperlipidemia and fibromyalgia admitted with acute abdominal pain with markedly elevated lipase in 1000 range and elevated transaminases.  Assessment/Plan:    Principal problem: Abdominal pain / Acute pancreatitis with transaminitis - No acute findings on abdominal ultrasound. No evidence of acute pancreatitis on CT abdomen. - Lipase was in 1000 range on the admission. Patient was treated conservatively with IV fluids, analgesia and antiemetics as needed. - Lipase level subsequently normalized. Liver function enzymes remain abnormal. - Trend LFTs. May need a GI consultation if LFTs are still elevated. Hepatic Function 05/08/2014 05/06/2014  Total Protein 6.2 7.3  Albumin 3.7 4.1  AST 139(H) 129(H)  ALT 156(H) 46(H)  Alk Phosphatase 173(H) 113  Total Bilirubin 0.6 0.5    Type 2 diabetes mellitus without complications - Continue sliding scale for now. - resume glipizide and metformin. - CBGs 176 - 196  Hypothyroidism - Continue Synthroid 75 g daily  Fibromyalgia - Pain controlled. Continue tramadol at bedtime. Amitriptyline on hold because of elevated transaminases.  Hypokalemia - Secondary to GI losses. Supplemented.  DVT Prophylaxis  - Heparin subcutaneous ordered   Code Status: Full.  Family Communication:  plan of care discussed with the patient Disposition Plan: Awaiting blood work, CMP and lipase. If elevated will need a GI consultation and if normal can likely be discharged today.  IV access:  Peripheral IV  Procedures and diagnostic studies:    US Abdomen Complete 05/07/2014    No acute findings. Area of focal fatty infiltration suspected in the liver adjacent to the falciform ligament. Prior  cholecystectomy.     Ct Abdomen Pelvis W Contrast 05/07/2014  1. No CT findings of acute pancreatitis or focal pancreatic abnormality. Clinical findings of pancreatitis can precede CT abnormalities. 2. Moderate volume of colonic stool which can be seen in the setting of constipation. 3. Post cholecystectomy without biliary dilatation.    Dg Abd Acute W/chest 05/07/2014   No evidence of active pulmonary disease. Nonobstructive bowel gas pattern.      Medical Consultants:  None   Other Consultants:  None   IAnti-Infectives:   None    Leisa Lenz, MD  [facility] Pager [phone]  If 7PM-7AM, please contact night-coverage [email or web address] Password [facility] 05/09/2014, 10:54 AM   LOS: 2 days    HPI/Subjective: No acute overnight events.  Objective: Filed Vitals:   05/08/14 0525 05/08/14 1430 05/08/14 2200 05/09/14 0519  BP: 142/88 135/76 138/72 141/76  Pulse: 86 92 88 91  Temp: 97.9 F (36.6 C) 98.4 F (36.9 C) 98.3 F (36.8 C) 97.9 F (36.6 C)  TempSrc: Oral Oral Oral Oral  Resp: '14 16 16 16  ' Height:      Weight:      SpO2: 97% 99% 98% 98%    Intake/Output Summary (Last 24 hours) at 05/09/14 1054 Last data filed at 05/09/14 0853  Gross per 24 hour  Intake    700 ml  Output      0 ml  Net    700 ml    Exam:   General:  Pt is alert, follows commands appropriately, not in acute distress  Cardiovascular: Regular rate and rhythm, S1/S2, no murmurs  Respiratory: Clear to auscultation bilaterally, no wheezing, no crackles, no rhonchi  Abdomen: Soft, non tender, non distended, bowel sounds present  Extremities: No edema, pulses DP and PT palpable bilaterally  Neuro: Grossly nonfocal  Data Reviewed: Basic Metabolic Panel:  Recent Labs Lab 05/06/14 2325 05/08/14 0519  NA 134* 139  K 3.3* 4.3  CL 100 105  CO2 28 31  GLUCOSE 157* 157*  BUN 15 <5*  CREATININE 0.85 0.80  CALCIUM 8.7 9.3   Liver Function Tests:  Recent Labs Lab 05/06/14 2325  05/08/14 0519  AST 129* 139*  ALT 46* 156*  ALKPHOS 113 173*  BILITOT 0.5 0.6  PROT 7.3 6.2  ALBUMIN 4.1 3.7    Recent Labs Lab 05/06/14 2325 05/07/14 0930 05/08/14 0519  LIPASE 1055* 792* 46   No results for input(s): AMMONIA in the last 168 hours. CBC:  Recent Labs Lab 05/06/14 2325 05/08/14 0519  WBC 10.4 8.7  NEUTROABS 6.9  --   HGB 13.8 13.2  HCT 41.0 39.7  MCV 93.0 92.3  PLT 244 228   Cardiac Enzymes:  Recent Labs Lab 05/06/14 2325  TROPONINI <0.03   BNP: Invalid input(s): POCBNP CBG:  Recent Labs Lab 05/08/14 0747 05/08/14 1123 05/08/14 1659 05/08/14 2150 05/09/14 0734  GLUCAP 160* 132* 196* 176* 179*    No results found for this or any previous visit (from the past 240 hour(s)).   Scheduled Meds: . heparin  5,000 Units Subcutaneous 3 times per day  . insulin aspart  0-5 Units Subcutaneous QHS  . insulin aspart  0-9 Units Subcutaneous TID WC  . levothyroxine  75 mcg Oral QAC breakfast  . polyethylene glycol  17 g Oral Daily  . traMADol  50 mg Oral QHS   Continuous Infusions:

## 2014-05-09 NOTE — Discharge Summary (Signed)
Physician Discharge Summary  Ashley Perkins CWC:[id number] DOB: Jan 11, 1941 DOA: 05/06/2014  PCP: Pcp Not In System  Admit date: 05/06/2014 Discharge date: 05/09/2014  Recommendations for Outpatient Follow-up:  1. Follow up with GI in next 1-2 weeks after discharge to recheck liver enzymes. 2. Lipase is WNL prior to discharge. 3. AST is 42, ALT 88, ALP 150 which is improved in past 24 hours. 4. Would recommend lower dose Elavil since Elavil can contribute to elevated liver enzymes at least until liver enzymes fully normalize.  Discharge Diagnoses:  Principal Problem:   Acute pancreatitis Active Problems:   Transaminitis   DM2 (diabetes mellitus, type 2)    Discharge Condition: stable   Diet recommendation: as tolerated   History of present illness:  74 year old female with history of diabetes mellitus, hyperlipidemia and fibromyalgia admitted with acute abdominal pain with markedly elevated lipase in 1000 range and elevated transaminases.  Assessment/Plan:    Principal problem: Abdominal pain / Acute pancreatitis with transaminitis - No acute findings on abdominal ultrasound. No evidence of acute pancreatitis on CT abdomen. - Lipase was in 1000 range on the admission. Patient was treated conservatively with IV fluids, analgesia and antiemetics as needed. - Lipase level subsequently normalized. Liver function enzymes remain abnormal. I communicated this with the pt and she should follow up with GI, pt said she will sch an appt. Please note that the LFT's are better slightly on discharge comapred with admission values. Hepatic Function Latest Ref Rng 05/09/2014 05/08/2014 05/06/2014  Total Protein 6.0 - 8.3 g/dL 6.0 6.2 7.3  Albumin 3.5 - 5.2 g/dL 3.3(L) 3.7 4.1  AST 0 - 37 U/L 42(H) 139(H) 129(H)  ALT 0 - 35 U/L 88(H) 156(H) 46(H)  Alk Phosphatase 39 - 117 U/L 150(H) 173(H) 113  Total Bilirubin 0.3 - 1.2 mg/dL 0.9 0.6 0.5  Bilirubin, Direct 0.0 - 0.3 mg/dL - - -     Active  Problems: Type 2 diabetes mellitus without complications - resume glipizide and metformin.   Hypothyroidism - Continue Synthroid 75 g daily  Fibromyalgia - Pain controlled. Continue tramadol at bedtime. Amitriptyline to be resumed at half the dose on discharge  Hypokalemia - Secondary to GI losses. Supplemented.  DVT Prophylaxis  - Heparin subcutaneous ordered while pt in hospital    Code Status: Full.  Family Communication: plan of care discussed with the patient and her husband at the bedside    IV access:  Peripheral IV  Procedures and diagnostic studies:   US Abdomen Complete 05/07/2014 No acute findings. Area of focal fatty infiltration suspected in the liver adjacent to the falciform ligament. Prior cholecystectomy.   Ct Abdomen Pelvis W Contrast 05/07/2014 1. No CT findings of acute pancreatitis or focal pancreatic abnormality. Clinical findings of pancreatitis can precede CT abnormalities. 2. Moderate volume of colonic stool which can be seen in the setting of constipation. 3. Post cholecystectomy without biliary dilatation.   Dg Abd Acute W/chest 05/07/2014 No evidence of active pulmonary disease. Nonobstructive bowel gas pattern.    Medical Consultants:  None   Other Consultants:  None   IAnti-Infectives:   None    Signed:  Leisa Lenz, MD  [facility] 05/09/2014, 4:02 PM  Pager #: [phone]   Discharge Exam: Filed Vitals:   05/09/14 1351  BP: 135/78  Pulse: 85  Temp: 98.3 F (36.8 C)  Resp: 16   Filed Vitals:   05/08/14 1430 05/08/14 2200 05/09/14 0519 05/09/14 1351  BP: 135/76 138/72 141/76 135/78  Pulse: 92  88 91 85  Temp: 98.4 F (36.9 C) 98.3 F (36.8 C) 97.9 F (36.6 C) 98.3 F (36.8 C)  TempSrc: Oral Oral Oral Oral  Resp: _0 Height:      Weight:      SpO2: 99% 98% 98% 93%    General: Pt is alert, follows commands appropriately, not in acute distress Cardiovascular: Regular  rate and rhythm, S1/S2 +, no murmurs Respiratory: Clear to auscultation bilaterally, no wheezing, no crackles, no rhonchi Abdominal: Soft, non tender, non distended, bowel sounds +, no guarding Extremities: no edema, no cyanosis, pulses palpable bilaterally DP and PT Neuro: Grossly nonfocal  Discharge Instructions  Discharge Instructions    Call MD for:  difficulty breathing, headache or visual disturbances    Complete by:  As directed      Call MD for:  persistant dizziness or light-headedness    Complete by:  As directed      Call MD for:  persistant nausea and vomiting    Complete by:  As directed      Call MD for:  severe uncontrolled pain    Complete by:  As directed      Diet - low sodium heart healthy    Complete by:  As directed      Discharge instructions    Complete by:  As directed   1. Follow up with GI in next 1-2 weeks after discharge to recheck liver enzymes. 2. Lipase is WNL prior to discharge. 3. AST is 42, ALT 88, ALP 150 which is improved in past 24 hours. 4. Would recommend lower dose Elavil since Elavil can contribute to elevated liver enzymes at least until liver enzymes fully normalize.     Increase activity slowly    Complete by:  As directed             Medication List    TAKE these medications        amitriptyline 25 MG tablet  Commonly known as:  ELAVIL  Take 1 tablet (25 mg total) by mouth at bedtime.     dicyclomine 20 MG tablet  Commonly known as:  BENTYL  Take 20 mg by mouth 3 (three) times daily as needed for spasms.     glipiZIDE 10 MG 24 hr tablet  Commonly known as:  GLUCOTROL XL  Take 10 mg by mouth daily.     ibuprofen 200 MG tablet  Commonly known as:  ADVIL,MOTRIN  Take 200 mg by mouth every 6 (six) hours as needed for headache, mild pain or moderate pain.     levothyroxine 88 MCG tablet  Commonly known as:  SYNTHROID, LEVOTHROID  Take 88 mcg by mouth daily before breakfast.     metFORMIN 500 MG tablet  Commonly known as:   GLUCOPHAGE  Take 1,000 mg by mouth 2 (two) times daily with a meal.     multivitamin with minerals Tabs tablet  Take 1 tablet by mouth daily.     ondansetron 4 MG tablet  Commonly known as:  ZOFRAN  Take 1 tablet (4 mg total) by mouth every 6 (six) hours.     traMADol 50 MG tablet  Commonly known as:  ULTRAM  Take 50 mg by mouth at bedtime.     VITAMIN B 12 PO  Take 1 tablet by mouth daily.     VITAMIN B COMPLEX PO  Take 1 tablet by mouth daily.          The results  of significant diagnostics from this hospitalization (including imaging, microbiology, ancillary and laboratory) are listed below for reference.    Significant Diagnostic Studies: US Abdomen Complete  05/07/2014   CLINICAL DATA:  Acute pancreatitis.  EXAM: ULTRASOUND ABDOMEN COMPLETE  COMPARISON:  05/07/2014  FINDINGS: Gallbladder: Prior cholecystectomy.  Common bile duct: Diameter: Normal caliber for post cholecystectomy state, 6-7 mm.  Liver: Focal area of increased echotexture adjacent to the falciform ligament, most compatible with focal fatty infiltration. No biliary ductal dilatation or other focal abnormality.  IVC: No abnormality visualized.  Pancreas: Visualized portion unremarkable.  Spleen: Size and appearance within normal limits.  Right Kidney: Length: 10.2 cm. Echogenicity within normal limits. No mass or hydronephrosis visualized.  Left Kidney: Length: 10.8 cm. Echogenicity within normal limits. No mass or hydronephrosis visualized.  Abdominal aorta: No aneurysm visualized.  Other findings: None.  IMPRESSION: No acute findings. Area of focal fatty infiltration suspected in the liver adjacent to the falciform ligament. Prior cholecystectomy.   Electronically Signed   By: Rolm Baptise M.D.   On: 05/07/2014 11:30   Ct Abdomen Pelvis W Contrast  05/07/2014   CLINICAL DATA:  Severe upper abdominal pain. Elevated lipase, without prior history of pancreatitis.  EXAM: CT ABDOMEN AND PELVIS WITH CONTRAST  TECHNIQUE:  Multidetector CT imaging of the abdomen and pelvis was performed using the standard protocol following bolus administration of intravenous contrast.  CONTRAST:  64m OMNIPAQUE IOHEXOL 300 MG/ML SOLN, 1041mOMNIPAQUE IOHEXOL 300 MG/ML SOLN  COMPARISON:  Radiographs earlier this day.  FINDINGS: The included lung bases are clear.  The pancreas is normal without peripancreatic inflammatory change or pancreatic ductal dilatation. No focal pancreatic lesion.  Clips in the gallbladder fossa from cholecystectomy. No CT findings of biliary dilatation. No focal hepatic lesion. The spleen and adrenal glands are normal. Kidneys demonstrate symmetric enhancement and excretion. No hydronephrosis or localizing abnormality.  Stomach is distended with ingested contrast. There is no small bowel dilatation. Moderate volume of stool is seen throughout the entire colon. The cecum is high-riding. The appendix is normal. No free air, free fluid, or intra-abdominal fluid collection.  The abdominal aorta is normal in caliber, tortuous in its course. There is no retroperitoneal adenopathy.  Within the pelvis the bladder is distended. The uterus and adnexa are normal for age. There is no pelvic free fluid.  Mild degenerative change in the lumbar spine. Geographic 11 mm sclerotic density in the right iliac bone, no aggressive characteristics.  IMPRESSION: 1. No CT findings of acute pancreatitis or focal pancreatic abnormality. Clinical findings of pancreatitis can precede CT abnormalities. 2. Moderate volume of colonic stool which can be seen in the setting of constipation. 3. Post cholecystectomy without biliary dilatation.   Electronically Signed   By: MeJeb Levering.D.   On: 05/07/2014 02:14   Dg Abd Acute W/chest  05/07/2014   CLINICAL DATA:  Severe sharp upper abdominal pain beginning 45 min prior to admission. Diaphoretic.  EXAM: ACUTE ABDOMEN SERIES (ABDOMEN 2 VIEW & CHEST 1 VIEW)  COMPARISON:  Chest 07/08/2012.  Abdomen  12/13/2010  FINDINGS: Normal heart size and pulmonary vascularity. No focal airspace disease or consolidation in the lungs. No blunting of costophrenic angles. No pneumothorax. Mediastinal contours appear intact.  Stool-filled colon without distention. There is a single gas-filled bowel loop in the right abdomen which appears represent colon. This does not appear abnormally distended. No small bowel distention. No free intra-abdominal air. No abnormal air-fluid levels. Surgical clips in the right upper quadrant. No  radiopaque stones. Visualized bones appear intact.  IMPRESSION: No evidence of active pulmonary disease. Nonobstructive bowel gas pattern.   Electronically Signed   By: Lucienne Capers M.D.   On: 05/07/2014 00:41    Microbiology: No results found for this or any previous visit (from the past 240 hour(s)).   Labs: Basic Metabolic Panel:  Recent Labs Lab 05/06/14 2325 05/08/14 0519 05/09/14 1045  NA 134* 139 138  K 3.3* 4.3 3.9  CL 100 105 102  CO2 _0 GLUCOSE 157* 157* 179*  BUN 15 <5* 9  CREATININE 0.85 0.80 0.97  CALCIUM 8.7 9.3 9.1   Liver Function Tests:  Recent Labs Lab 05/06/14 2325 05/08/14 0519 05/09/14 1045  AST 129* 139* 42*  ALT 46* 156* 88*  ALKPHOS 113 173* 150*  BILITOT 0.5 0.6 0.9  PROT 7.3 6.2 6.0  ALBUMIN 4.1 3.7 3.3*    Recent Labs Lab 05/06/14 2325 05/07/14 0930 05/08/14 0519 05/09/14 1045  LIPASE 1055* 792* 46 34   No results for input(s): AMMONIA in the last 168 hours. CBC:  Recent Labs Lab 05/06/14 2325 05/08/14 0519  WBC 10.4 8.7  NEUTROABS 6.9  --   HGB 13.8 13.2  HCT 41.0 39.7  MCV 93.0 92.3  PLT 244 228   Cardiac Enzymes:  Recent Labs Lab 05/06/14 2325  TROPONINI <0.03   BNP: BNP (last 3 results) No results for input(s): BNP in the last 8760 hours.  ProBNP (last 3 results) No results for input(s): PROBNP in the last 8760 hours.  CBG:  Recent Labs Lab 05/08/14 1123 05/08/14 1659 05/08/14 2150  05/09/14 0734 05/09/14 1208  GLUCAP 132* 196* 176* 179* 141*    Time coordinating discharge: Over 30 minutes

## 2014-05-09 NOTE — Discharge Instructions (Addendum)
Start metformin 05/10/2014.  Acute Pancreatitis Acute pancreatitis is a disease in which the pancreas becomes suddenly inflamed. The pancreas is a large gland located behind your stomach. The pancreas produces enzymes that help digest food. The pancreas also releases the hormones glucagon and insulin that help regulate blood sugar. Damage to the pancreas occurs when the digestive enzymes from the pancreas are activated and begin attacking the pancreas before being released into the intestine. Most acute attacks last a couple of days and can cause serious complications. Some people become dehydrated and develop low blood pressure. In severe cases, bleeding into the pancreas can lead to shock and can be life-threatening. The lungs, heart, and kidneys may fail. CAUSES  Pancreatitis can happen to anyone. In some cases, the cause is unknown. Most cases are caused by:  Alcohol abuse.  Gallstones. Other less common causes are:  Certain medicines.  Exposure to certain chemicals.  Infection.  Damage caused by an accident (trauma).  Abdominal surgery. SYMPTOMS   Pain in the upper abdomen that may radiate to the back.  Tenderness and swelling of the abdomen.  Nausea and vomiting. DIAGNOSIS  Your caregiver will perform a physical exam. Blood and stool tests may be done to confirm the diagnosis. Imaging tests may also be done, such as X-rays, CT scans, or an ultrasound of the abdomen. TREATMENT  Treatment usually requires a stay in the hospital. Treatment may include:  Pain medicine.  Fluid replacement through an intravenous line (IV).  Placing a tube in the stomach to remove stomach contents and control vomiting.  Not eating for 3 or 4 days. This gives your pancreas a rest, because enzymes are not being produced that can cause further damage.  Antibiotic medicines if your condition is caused by an infection.  Surgery of the pancreas or gallbladder. HOME CARE INSTRUCTIONS   Follow the  diet advised by your caregiver. This may involve avoiding alcohol and decreasing the amount of fat in your diet.  Eat smaller, more frequent meals. This reduces the amount of digestive juices the pancreas produces.  Drink enough fluids to keep your urine clear or pale yellow.  Only take over-the-counter or prescription medicines as directed by your caregiver.  Avoid drinking alcohol if it caused your condition.  Do not smoke.  Get plenty of rest.  Check your blood sugar at home as directed by your caregiver.  Keep all follow-up appointments as directed by your caregiver. SEEK MEDICAL CARE IF:   You do not recover as quickly as expected.  You develop new or worsening symptoms.  You have persistent pain, weakness, or nausea.  You recover and then have another episode of pain. SEEK IMMEDIATE MEDICAL CARE IF:   You are unable to eat or keep fluids down.  Your pain becomes severe.  You have a fever or persistent symptoms for more than 2 to 3 days.  You have a fever and your symptoms suddenly get worse.  Your skin or the white part of your eyes turn yellow (jaundice).  You develop vomiting.  You feel dizzy, or you faint.  Your blood sugar is high (over 300 mg/dL). MAKE SURE YOU:   Understand these instructions.  Will watch your condition.  Will get help right away if you are not doing well or get worse. Document Released: 03/02/2005 Document Revised: 09/01/2011 Document Reviewed: 06/11/2011 [facility] Patient Information 2015 [place], [place]. This information is not intended to replace advice given to you by your health care provider. Make sure you discuss  any questions you have with your health care provider. ° °

## 2014-05-09 NOTE — Progress Notes (Signed)
Discussed discharge summary with patient. Reviewed all medications with patient. Patient received Rx. Patient ready for discharge. 

## 2014-10-02 ENCOUNTER — Encounter: Payer: Commercial Managed Care - HMO | Attending: Family Medicine | Admitting: *Deleted

## 2014-10-02 ENCOUNTER — Encounter: Payer: Self-pay | Admitting: *Deleted

## 2014-10-02 VITALS — Ht 68.0 in | Wt 180.7 lb

## 2014-10-02 DIAGNOSIS — Z6827 Body mass index (BMI) 27.0-27.9, adult: Secondary | ICD-10-CM | POA: Diagnosis not present

## 2014-10-02 DIAGNOSIS — E119 Type 2 diabetes mellitus without complications: Secondary | ICD-10-CM | POA: Diagnosis present

## 2014-10-02 DIAGNOSIS — Z713 Dietary counseling and surveillance: Secondary | ICD-10-CM | POA: Diagnosis not present

## 2014-10-02 NOTE — Patient Instructions (Addendum)
Rather than staying up to 3AM ...Marland KitchenMarland KitchenGo to bed after news and go to sleep  Awaken in the morning,Get dressed for the day  get up and eat breakfast     Add almonds to cereal, milk & fruit  Do your devotions and facebook etc.  YOU MUST EAT LUNCH ( [place] on whole wheat bread, 2% cheese) milk  Always combine protein with your carbohydrates  Dinner: meat, vegetable and starch (milk)  Night Snack: Measure out your popcorn to equal 15grams of carbohydrates                      [employer] [place] [employer]

## 2014-10-03 NOTE — Progress Notes (Signed)
Diabetes Perkins-Management Education  Visit Type:  Follow-up  Appt. Start Time: 1530 Appt. End Time: 1700  10/03/2014  Ms. Ashley Perkins, identified by name and date of birth, is a 74 y.o. female with a diagnosis of Diabetes:  .  Other people present during visit:  Patient . I have met with Ashley Perkins on two other occassions in 2014. She returns today for a refresher on dietary recommendations. She presently goes to bed 11:30pm and reads untill about 3AM and gets up around noon. She eats breakfast, skips lunch and eats a late dinner with continual snacking until it is time for bed. She notes that she forgets to eat lunch and notes other memory challenges. She has a hospitalization 05/06/14 for acute pancreatitis.  ASSESSMENT  Height '5\' 8"'  (1.727 m), weight 180 lb 11.2 oz (81.965 kg). Body mass index is 27.48 kg/(m^2).  Subsequent Visit Information:  Since your last visit have you experienced any weight changes?: Loss Weight Loss (lbs): 10 Since your last visit, are you checking your blood glucose at least once a day?: Yes  Psychosocial:   Patient Belief/Attitude about Diabetes: Motivated to manage diabetes Perkins-care barriers: None Perkins-management support: Doctor's office, Family, CDE visits Other persons present: Patient Patient Concerns: Nutrition/Meal planning, Healthy Lifestyle, Glycemic Control Special Needs: None Preferred Learning Style: No preference indicated Learning Readiness: Change in progress  Complications:   Last HgB A1C per patient/outside source: 7.2 mg/dL How often do you check your blood sugar?: 1-2 times/day Fasting Blood glucose range (mg/dL): 130-179, 180-200 (136-182)  Diet Intake:  Breakfast: cheerios, blueberries, 2% milk (Protein) Lunch: none Dinner: pork chops, potatoe, vegetable, cold coffee fruit salad, cottage cheese Snack (evening): popcorn Beverage(s): cold coffee  Exercise:  Exercise: Moderate (swimming / aerobic walking) (water aerobics 2X  weeks @ 1mnutes) Moderate Exercise amount of time (min / week): 150  Individualized Plan for Diabetes Perkins-Management Training:   Learning Objective:  Patient will have a greater understanding of diabetes Perkins-management. Patient education plan per assessed needs and concerns is to attend individual sessions for    Education Topics Reviewed with Patient Today:   Role of diet in the treatment of diabetes and the relationship between the three main macronutrients and blood glucose level Purpose and frequency of SMBG. Taught treatment of hypoglycemia - the 15 rule. Worked with patient to identify barriers to care and solutions Lifestyle issues that need to be addressed for better diabetes care  PATIENTS GOALS/Plan (Developed by the patient):  Nutrition: General guidelines for healthy choices and portions discussed Physical Activity: Exercise 3-5 times per week, 30 minutes per day Monitoring : test my blood glucose as discussed (note x per day with comment)  Patient Perkins Evaluation of Goals - Patient rates Perkins as meeting previously set goals:   Nutrition: 50 - 75 % Physical Activity: >75% Medications: >75% Monitoring: >75% Reducing Risk: 50 - 75 % Health Coping: 50 - 75 %  Plan  Patient Instructions  Rather than staying up to 3AM ....Marland KitchenMarland Kitcheno to bed after news and go to sleep  Awaken in the morning,Get dressed for the day  get up and eat breakfast     Add almonds to cereal, milk & fruit  Do your devotions and facebook etc.  YOU MUST EAT LUNCH ( t[place]on whole wheat bread, 2% cheese) milk  Always combine protein with your carbohydrates  Dinner: meat, vegetable and starch (milk)  Night Snack: Measure out your popcorn to equal 15grams of carbohydrates  [name]                        Expected Outcomes:  Demonstrated interest in learning. Expect positive outcomes  Education material provided: Food label handouts, Meal plan card  and My Plate  If problems or questions, patient to contact team via:  Phone  Future DSME appointment: - PRN

## 2014-10-03 NOTE — Progress Notes (Signed)
Diabetes Self-Management Education  Visit Type:  Follow-up  Appt. Start Time: 1530 Appt. End Time: 1700  10/03/2014  Ms. Ashley Perkins, identified by name and date of birth, is a 74 y.o. female with a diagnosis of Diabetes:  .  Other people present during visit:  Patient . I have met with Ashley Perkins on two occassions in 2014. She returns today for a refresher on dietary choices. Her usual routine is to go to bed around 11:00pm and read until 3AM. She awakens around 11AM and eats breakfast. She does not eat lunch, has dinner 7:30pm and snacks until it is time to go to bed. She was hospitalized 05/06/14 with acute pancreatitis.  ASSESSMENT  Height '5\' 8"'  (1.727 m), weight 180 lb 11.2 oz (81.965 kg). Body mass index is 27.48 kg/(m^2).   Subsequent Visit Information:  Since your last visit have you experienced any weight changes?: Loss Weight Loss (lbs): 10 Since your last visit, are you checking your blood glucose at least once a day?: Yes  Psychosocial:   Patient Belief/Attitude about Diabetes: Motivated to manage diabetes Self-care barriers: None Self-management support: Doctor's office, Family, CDE visits Other persons present: Patient Patient Concerns: Nutrition/Meal planning, Healthy Lifestyle, Glycemic Control Special Needs: None Preferred Learning Style: No preference indicated Learning Readiness: Change in progress  Complications:   Last HgB A1C per patient/outside source: 7.2 mg/dL How often do you check your blood sugar?: 1-2 times/day Fasting Blood glucose range (mg/dL): 130-179, 180-200 (136-182)  Diet Intake:  Breakfast: cheerios, blueberries, 2% milk (Protein) Lunch: none Dinner: pork chops, potatoe, vegetable, cold coffee fruit salad, cottage cheese Snack (evening): popcorn Beverage(s): cold coffee  Exercise:  Exercise: Moderate (swimming / aerobic walking) (water aerobics 2X weeks @ 19mnutes) Moderate Exercise amount of time (min / week):  150  Individualized Plan for Diabetes Self-Management Training:   Learning Objective:  Patient will have a greater understanding of diabetes self-management. Patient education plan per assessed needs and concerns is to attend individual sessions     Education Topics Reviewed with Patient Today:   Role of diet in the treatment of diabetes and the relationship between the three main macronutrients and blood glucose level Purpose and frequency of SMBG. Taught treatment of hypoglycemia - the 15 rule. Worked with patient to identify barriers to care and solutions Lifestyle issues that need to be addressed for better diabetes care  PATIENTS GOALS/Plan (Developed by the patient):  Nutrition: General guidelines for healthy choices and portions discussed Physical Activity: Exercise 3-5 times per week, 30 minutes per day Monitoring : test my blood glucose as discussed (note x per day with comment)  Patient Self Evaluation of Goals - Patient rates self as meeting previously set goals:   Nutrition: 50 - 75 % Physical Activity: >75% Medications: >75% Monitoring: >75% Reducing Risk: 50 - 75 % Health Coping: 50 - 75 %  Plan:   Patient Instructions  Rather than staying up to 3AM ....Marland KitchenMarland Kitcheno to bed after news and go to sleep  Awaken in the morning,Get dressed for the day  get up and eat breakfast     Add almonds to cereal, milk & fruit  Do your devotions and facebook etc.  YOU MUST EAT LUNCH ( t[place] whole wheat bread, 2% cheese) milk  Always combine protein with your carbohydrates  Dinner: meat, vegetable and starch (milk)  Night Snack: Measure out your popcorn to equal 15grams of carbohydrates  [name]                        Expected Outcomes:  Demonstrated interest in learning. Expect positive outcomes  Education material provided: Food label handouts, Meal plan card and My Plate  If problems or questions, patient to contact team  via:  Phone  Future DSME appointment: - PRN

## 2014-11-02 DIAGNOSIS — H18832 Recurrent erosion of cornea, left eye: Secondary | ICD-10-CM | POA: Insufficient documentation

## 2014-11-02 DIAGNOSIS — H18519 Endothelial corneal dystrophy, unspecified eye: Secondary | ICD-10-CM | POA: Insufficient documentation

## 2015-02-27 ENCOUNTER — Other Ambulatory Visit: Payer: Self-pay | Admitting: Orthopaedic Surgery

## 2015-02-27 DIAGNOSIS — M25562 Pain in left knee: Secondary | ICD-10-CM

## 2015-02-27 DIAGNOSIS — M25462 Effusion, left knee: Secondary | ICD-10-CM

## 2015-03-05 ENCOUNTER — Ambulatory Visit
Admission: RE | Admit: 2015-03-05 | Discharge: 2015-03-05 | Disposition: A | Discharge status: Home / Self Care | Payer: Commercial Managed Care - HMO | Source: Ambulatory Visit | Attending: Orthopaedic Surgery | Admitting: Orthopaedic Surgery

## 2015-03-05 DIAGNOSIS — M25462 Effusion, left knee: Secondary | ICD-10-CM

## 2015-03-05 DIAGNOSIS — M25562 Pain in left knee: Secondary | ICD-10-CM

## 2015-07-04 ENCOUNTER — Other Ambulatory Visit: Payer: Self-pay | Admitting: Family Medicine

## 2015-07-12 ENCOUNTER — Other Ambulatory Visit: Payer: Self-pay | Admitting: Orthopaedic Surgery

## 2015-07-12 DIAGNOSIS — M533 Sacrococcygeal disorders, not elsewhere classified: Principal | ICD-10-CM

## 2015-07-12 DIAGNOSIS — G8929 Other chronic pain: Secondary | ICD-10-CM

## 2015-07-22 ENCOUNTER — Other Ambulatory Visit: Payer: Self-pay | Admitting: Orthopaedic Surgery

## 2015-07-22 DIAGNOSIS — M533 Sacrococcygeal disorders, not elsewhere classified: Principal | ICD-10-CM

## 2015-07-22 DIAGNOSIS — G8929 Other chronic pain: Secondary | ICD-10-CM

## 2015-07-23 ENCOUNTER — Inpatient Hospital Stay: Admission: RE | Admit: 2015-07-23 | Payer: Self-pay | Source: Ambulatory Visit

## 2015-07-25 ENCOUNTER — Ambulatory Visit
Admission: RE | Admit: 2015-07-25 | Discharge: 2015-07-25 | Disposition: A | Discharge status: Home / Self Care | Payer: Commercial Managed Care - HMO | Source: Ambulatory Visit | Attending: Orthopaedic Surgery | Admitting: Orthopaedic Surgery

## 2015-07-25 DIAGNOSIS — G8929 Other chronic pain: Secondary | ICD-10-CM

## 2015-07-25 DIAGNOSIS — M533 Sacrococcygeal disorders, not elsewhere classified: Principal | ICD-10-CM

## 2015-08-08 DIAGNOSIS — H524 Presbyopia: Secondary | ICD-10-CM | POA: Insufficient documentation

## 2015-08-08 DIAGNOSIS — H5213 Myopia, bilateral: Secondary | ICD-10-CM | POA: Insufficient documentation

## 2015-08-08 DIAGNOSIS — H26491 Other secondary cataract, right eye: Secondary | ICD-10-CM | POA: Insufficient documentation

## 2015-12-03 ENCOUNTER — Other Ambulatory Visit: Payer: Self-pay | Admitting: Family Medicine

## 2015-12-03 DIAGNOSIS — Z1231 Encounter for screening mammogram for malignant neoplasm of breast: Secondary | ICD-10-CM

## 2015-12-10 ENCOUNTER — Ambulatory Visit
Admission: RE | Admit: 2015-12-10 | Discharge: 2015-12-10 | Disposition: A | Discharge status: Home / Self Care | Payer: Commercial Managed Care - HMO | Source: Ambulatory Visit | Attending: Family Medicine | Admitting: Family Medicine

## 2015-12-10 ENCOUNTER — Other Ambulatory Visit: Payer: Self-pay | Admitting: Family Medicine

## 2015-12-10 DIAGNOSIS — N644 Mastodynia: Secondary | ICD-10-CM

## 2015-12-10 DIAGNOSIS — Z1231 Encounter for screening mammogram for malignant neoplasm of breast: Secondary | ICD-10-CM

## 2015-12-13 ENCOUNTER — Ambulatory Visit
Admission: RE | Admit: 2015-12-13 | Discharge: 2015-12-13 | Disposition: A | Discharge status: Home / Self Care | Payer: Commercial Managed Care - HMO | Source: Ambulatory Visit | Attending: Family Medicine | Admitting: Family Medicine

## 2015-12-13 DIAGNOSIS — N644 Mastodynia: Secondary | ICD-10-CM

## 2016-02-27 ENCOUNTER — Ambulatory Visit (INDEPENDENT_AMBULATORY_CARE_PROVIDER_SITE_OTHER): Payer: Self-pay | Admitting: Orthopaedic Surgery

## 2016-02-28 ENCOUNTER — Encounter (INDEPENDENT_AMBULATORY_CARE_PROVIDER_SITE_OTHER): Payer: Self-pay | Admitting: Orthopaedic Surgery

## 2016-02-28 ENCOUNTER — Ambulatory Visit (INDEPENDENT_AMBULATORY_CARE_PROVIDER_SITE_OTHER): Payer: Commercial Managed Care - HMO | Admitting: Orthopaedic Surgery

## 2016-02-28 VITALS — Resp 14 | Ht 68.0 in | Wt 180.0 lb

## 2016-02-28 DIAGNOSIS — M25562 Pain in left knee: Secondary | ICD-10-CM

## 2016-02-28 DIAGNOSIS — G8929 Other chronic pain: Secondary | ICD-10-CM

## 2016-02-28 MED ORDER — BUPIVACAINE HCL 0.5 % IJ SOLN
3.0000 mL | INTRAMUSCULAR | Status: AC | PRN
  Administered 2016-02-28: 3 mL via INTRA_ARTICULAR

## 2016-02-28 MED ORDER — METHYLPREDNISOLONE ACETATE 40 MG/ML IJ SUSP
80.0000 mg | INTRAMUSCULAR | Status: AC | PRN
  Administered 2016-02-28: 80 mg

## 2016-02-28 MED ORDER — LIDOCAINE HCL 1 % IJ SOLN
5.0000 mL | INTRAMUSCULAR | Status: AC | PRN
  Administered 2016-02-28: 5 mL

## 2016-02-28 NOTE — Progress Notes (Signed)
   Office Visit Note   Patient: Ashley Perkins           Date of Birth: 06-03-1940           MRN: [id number] Visit Date: 02/28/2016              Requested by: No referring provider defined for this encounter. PCP: Pcp Not In System   Assessment & Plan: Visit Diagnoses: Osteoarthritis left knee with increased valgus. Plan: Inject left knee and follow-up on a when necessary basis. Discussion regarding total knee replacement  Follow-Up Instructions: No Follow-up on file.   Orders:  No orders of the defined types were placed in this encounter.  No orders of the defined types were placed in this encounter.     Procedures: Large Joint Inj Date/Time: 02/28/2016 2:51 PM Performed by: Garald Balding Authorized by: Garald Balding   Consent Given by:  Patient Timeout: prior to procedure the correct patient, procedure, and site was verified   Indications:  Pain and joint swelling Location:  Knee Site:  L knee Prep: patient was prepped and draped in usual sterile fashion   Needle Size:  25 G Needle Length:  1.5 inches Approach:  Anteromedial Ultrasound Guidance: No   Fluoroscopic Guidance: No   Arthrogram: No   Medications:  5 mL lidocaine 1 %; 80 mg methylPREDNISolone acetate 40 MG/ML; 3 mL bupivacaine 0.5 % Aspiration Attempted: No   Patient tolerance:  Patient tolerated the procedure well with no immediate complications     Clinical Data: No additional findings.   Subjective: Chief Complaint  Patient presents with  . Left Knee - Pain    Pt complaining of Left knee pain, localized on outside of Left knee.  Intermittent pain, "catching"  Mrs. Mcconaughy has been evaluated multiple occasions in the past for the left knee osteoarthritis. She is fully where the diagnosis and treatment options. She wanted to have another cortisone injection today  Review of Systems   Objective: Vital Signs: There were no vitals taken for this visit.  Physical Exam  Ortho  Exam left knee exam reveals moderate effusion. Increased valgus with tenderness predominantly along the lateral compartment. There was some patella crepitation. Full extension and flexion over 105 without instability. No swelling distally. Neurovascular exam intact.  Specialty Comments:  No specialty comments available.  Imaging: No results found.   PMFS History: Patient Active Problem List   Diagnosis Date Noted  . Elevated liver function tests   . Generalized abdominal pain   . Depression   . Acute pancreatitis 05/07/2014  . Transaminitis 05/07/2014  . DM2 (diabetes mellitus, type 2) ([place]) 05/07/2014   Past Medical History:  Diagnosis Date  . Diabetes mellitus without complication   . Fibromyalgia   . Hypercholesteremia     No family history on file.  Past Surgical History:  Procedure Laterality Date  . BACK SURGERY    . CHOLECYSTECTOMY    . KNEE ARTHROSCOPY    . LAPAROSCOPIC UNILATERAL SALPINGO OOPHERECTOMY    . TUBAL LIGATION     Social History   Occupational History  . Not on file.   Social History Main Topics  . Smoking status: Never Smoker  . Smokeless tobacco: Not on file  . Alcohol use No  . Drug use: Unknown  . Sexual activity: Yes

## 2016-04-22 DIAGNOSIS — R05 Cough: Secondary | ICD-10-CM | POA: Diagnosis not present

## 2016-04-29 DIAGNOSIS — R69 Illness, unspecified: Secondary | ICD-10-CM | POA: Diagnosis not present

## 2016-04-29 DIAGNOSIS — R062 Wheezing: Secondary | ICD-10-CM | POA: Diagnosis not present

## 2016-04-29 DIAGNOSIS — R05 Cough: Secondary | ICD-10-CM | POA: Diagnosis not present

## 2016-04-29 DIAGNOSIS — J22 Unspecified acute lower respiratory infection: Secondary | ICD-10-CM | POA: Diagnosis not present

## 2016-04-29 DIAGNOSIS — R0989 Other specified symptoms and signs involving the circulatory and respiratory systems: Secondary | ICD-10-CM | POA: Diagnosis not present

## 2016-05-25 DIAGNOSIS — R69 Illness, unspecified: Secondary | ICD-10-CM | POA: Diagnosis not present

## 2016-05-25 DIAGNOSIS — Z7984 Long term (current) use of oral hypoglycemic drugs: Secondary | ICD-10-CM | POA: Diagnosis not present

## 2016-05-25 DIAGNOSIS — E039 Hypothyroidism, unspecified: Secondary | ICD-10-CM | POA: Diagnosis not present

## 2016-05-25 DIAGNOSIS — E210 Primary hyperparathyroidism: Secondary | ICD-10-CM | POA: Diagnosis not present

## 2016-05-25 DIAGNOSIS — E119 Type 2 diabetes mellitus without complications: Secondary | ICD-10-CM | POA: Diagnosis not present

## 2016-05-25 DIAGNOSIS — E215 Disorder of parathyroid gland, unspecified: Secondary | ICD-10-CM | POA: Diagnosis not present

## 2016-05-25 DIAGNOSIS — E7800 Pure hypercholesterolemia, unspecified: Secondary | ICD-10-CM | POA: Diagnosis not present

## 2016-06-26 DIAGNOSIS — Z79891 Long term (current) use of opiate analgesic: Secondary | ICD-10-CM | POA: Diagnosis not present

## 2016-06-26 DIAGNOSIS — Z0000 Encounter for general adult medical examination without abnormal findings: Secondary | ICD-10-CM | POA: Diagnosis not present

## 2016-06-26 DIAGNOSIS — R002 Palpitations: Secondary | ICD-10-CM | POA: Diagnosis not present

## 2016-06-26 DIAGNOSIS — E039 Hypothyroidism, unspecified: Secondary | ICD-10-CM | POA: Diagnosis not present

## 2016-06-26 DIAGNOSIS — Z973 Presence of spectacles and contact lenses: Secondary | ICD-10-CM | POA: Diagnosis not present

## 2016-06-26 DIAGNOSIS — Z87828 Personal history of other (healed) physical injury and trauma: Secondary | ICD-10-CM | POA: Diagnosis not present

## 2016-06-26 DIAGNOSIS — K649 Unspecified hemorrhoids: Secondary | ICD-10-CM | POA: Diagnosis not present

## 2016-06-26 DIAGNOSIS — M797 Fibromyalgia: Secondary | ICD-10-CM | POA: Diagnosis not present

## 2016-06-26 DIAGNOSIS — E119 Type 2 diabetes mellitus without complications: Secondary | ICD-10-CM | POA: Diagnosis not present

## 2016-06-26 DIAGNOSIS — Z79899 Other long term (current) drug therapy: Secondary | ICD-10-CM | POA: Diagnosis not present

## 2016-06-26 DIAGNOSIS — R69 Illness, unspecified: Secondary | ICD-10-CM | POA: Diagnosis not present

## 2016-06-26 DIAGNOSIS — H9319 Tinnitus, unspecified ear: Secondary | ICD-10-CM | POA: Diagnosis not present

## 2016-06-26 DIAGNOSIS — H9110 Presbycusis, unspecified ear: Secondary | ICD-10-CM | POA: Diagnosis not present

## 2016-06-26 DIAGNOSIS — G4700 Insomnia, unspecified: Secondary | ICD-10-CM | POA: Diagnosis not present

## 2016-07-01 DIAGNOSIS — H26493 Other secondary cataract, bilateral: Secondary | ICD-10-CM | POA: Diagnosis not present

## 2016-07-01 DIAGNOSIS — H1851 Endothelial corneal dystrophy: Secondary | ICD-10-CM | POA: Diagnosis not present

## 2016-07-01 DIAGNOSIS — E119 Type 2 diabetes mellitus without complications: Secondary | ICD-10-CM | POA: Diagnosis not present

## 2016-07-20 DIAGNOSIS — R69 Illness, unspecified: Secondary | ICD-10-CM | POA: Diagnosis not present

## 2016-08-18 ENCOUNTER — Ambulatory Visit (INDEPENDENT_AMBULATORY_CARE_PROVIDER_SITE_OTHER): Payer: Medicare HMO

## 2016-08-18 ENCOUNTER — Encounter (INDEPENDENT_AMBULATORY_CARE_PROVIDER_SITE_OTHER): Payer: Self-pay

## 2016-08-18 ENCOUNTER — Encounter (INDEPENDENT_AMBULATORY_CARE_PROVIDER_SITE_OTHER): Payer: Self-pay | Admitting: Orthopaedic Surgery

## 2016-08-18 ENCOUNTER — Ambulatory Visit (INDEPENDENT_AMBULATORY_CARE_PROVIDER_SITE_OTHER): Payer: Medicare HMO | Admitting: Orthopaedic Surgery

## 2016-08-18 VITALS — Resp 14 | Ht 68.0 in | Wt 180.0 lb

## 2016-08-18 DIAGNOSIS — M542 Cervicalgia: Secondary | ICD-10-CM

## 2016-08-18 MED ORDER — METHYLPREDNISOLONE 4 MG PO TBPK: ORAL_TABLET | ORAL | 0 refills | Status: DC

## 2016-08-18 NOTE — Progress Notes (Signed)
Office Visit Note   Patient: Ashley Perkins           Date of Birth: January 04, 1941           MRN: [id number] Visit Date: 08/18/2016              Requested by: No referring provider defined for this encounter. PCP: Ashley Ruff, MD   Assessment & Plan: Visit Diagnoses:  1. Neck pain without injury   2. Cervicalgia   Cervical spine osteoarthritis with acute onset of neck stiffness. No radiculopathy  Plan:. Medrol Dosepak. Cervical spine exercises. Return as needed  Follow-Up Instructions: Return if symptoms worsen or fail to improve.   Orders:  Orders Placed This Encounter  Procedures  . XR Cervical Spine 2 or 3 views   Meds ordered this encounter  Medications  . methylPREDNISolone (MEDROL DOSEPAK) 4 MG TBPK tablet    Sig: Follow package directions.    Dispense:  21 tablet    Refill:  0      Procedures: No procedures performed   Clinical Data: No additional findings.   Subjective: Chief Complaint  Patient presents with  . Neck - Pain    Ashley Perkins woke up Sunday morning with Right sided neck pain behind her ear. No headaches, or injury  Ashley Perkins, a by her husband relates that she is involved in water aerobic exercises twice a week. These include neck exercises she's done very well up until recently I this past weekend when she simply woke up with acute onset of neck pain and stiffness. she denies any shoulder pain or referred discomfort to  either upper extremity.  HPI  Review of Systems   Objective: Vital Signs: Resp 14   Ht 5\' 8"  (1.727 m)   Wt 180 lb (81.6 kg)   BMI 27.37 kg/m   Physical Exam  Ortho Exam limitation of motion of cervical spine. Only about 30 of motion in all planes with predominantly right-sided neck pain. No masses. Diffuse tenderness along the right side of her neck. Painless range of motion of both upper extremities. Neurovascular exam intact.  Specialty Comments:  No specialty comments available.  Imaging: Xr Cervical  Spine 2 Or 3 Views  Result Date: 08/18/2016 Films of the cervical spine were obtained in 2 projections i.e. lateral and AP. Her are significant degenerative changes at C6-7 and to a lesser extent C5-6. Loss of normal cervical lordosis. No listhesis    PMFS History: Patient Active Problem List   Diagnosis Date Noted  . Elevated liver function tests   . Generalized abdominal pain   . Depression   . Acute pancreatitis 05/07/2014  . Transaminitis 05/07/2014  . DM2 (diabetes mellitus, type 2) ([place]) 05/07/2014   Past Medical History:  Diagnosis Date  . Diabetes mellitus without complication ([place])   . Fibromyalgia   . Hypercholesteremia     No family history on file.  Past Surgical History:  Procedure Laterality Date  . BACK SURGERY    . CHOLECYSTECTOMY    . KNEE ARTHROSCOPY    . LAPAROSCOPIC UNILATERAL SALPINGO OOPHERECTOMY    . TUBAL LIGATION     Social History   Occupational History  . Not on file.   Social History Main Topics  . Smoking status: Never Smoker  . Smokeless tobacco: Never Used  . Alcohol use No  . Drug use: No  . Sexual activity: Yes     Garald Balding, MD   Note - This record has  been created using [employer].  Chart creation errors have been sought, but may not always  have been located. Such creation errors do not reflect on  the standard of medical care.

## 2016-08-19 DIAGNOSIS — R69 Illness, unspecified: Secondary | ICD-10-CM | POA: Diagnosis not present

## 2016-09-09 DIAGNOSIS — R69 Illness, unspecified: Secondary | ICD-10-CM | POA: Diagnosis not present

## 2016-09-09 DIAGNOSIS — Z7984 Long term (current) use of oral hypoglycemic drugs: Secondary | ICD-10-CM | POA: Diagnosis not present

## 2016-09-09 DIAGNOSIS — E119 Type 2 diabetes mellitus without complications: Secondary | ICD-10-CM | POA: Diagnosis not present

## 2016-10-02 DIAGNOSIS — H26491 Other secondary cataract, right eye: Secondary | ICD-10-CM | POA: Diagnosis not present

## 2016-10-16 DIAGNOSIS — E119 Type 2 diabetes mellitus without complications: Secondary | ICD-10-CM | POA: Diagnosis not present

## 2016-10-16 DIAGNOSIS — E7800 Pure hypercholesterolemia, unspecified: Secondary | ICD-10-CM | POA: Diagnosis not present

## 2016-10-16 DIAGNOSIS — M797 Fibromyalgia: Secondary | ICD-10-CM | POA: Diagnosis not present

## 2016-10-16 DIAGNOSIS — G479 Sleep disorder, unspecified: Secondary | ICD-10-CM | POA: Diagnosis not present

## 2016-10-16 DIAGNOSIS — E039 Hypothyroidism, unspecified: Secondary | ICD-10-CM | POA: Diagnosis not present

## 2016-10-16 DIAGNOSIS — Z7984 Long term (current) use of oral hypoglycemic drugs: Secondary | ICD-10-CM | POA: Diagnosis not present

## 2016-11-13 DIAGNOSIS — R002 Palpitations: Secondary | ICD-10-CM | POA: Diagnosis not present

## 2016-11-13 DIAGNOSIS — R69 Illness, unspecified: Secondary | ICD-10-CM | POA: Diagnosis not present

## 2016-11-14 DIAGNOSIS — R69 Illness, unspecified: Secondary | ICD-10-CM | POA: Diagnosis not present

## 2016-11-17 DIAGNOSIS — L298 Other pruritus: Secondary | ICD-10-CM | POA: Diagnosis not present

## 2016-11-17 DIAGNOSIS — R208 Other disturbances of skin sensation: Secondary | ICD-10-CM | POA: Diagnosis not present

## 2016-11-26 ENCOUNTER — Encounter (INDEPENDENT_AMBULATORY_CARE_PROVIDER_SITE_OTHER): Payer: Self-pay | Admitting: Orthopedic Surgery

## 2016-11-26 ENCOUNTER — Ambulatory Visit (INDEPENDENT_AMBULATORY_CARE_PROVIDER_SITE_OTHER): Payer: Medicare HMO | Admitting: Orthopedic Surgery

## 2016-11-26 ENCOUNTER — Ambulatory Visit (INDEPENDENT_AMBULATORY_CARE_PROVIDER_SITE_OTHER): Payer: Medicare HMO

## 2016-11-26 VITALS — BP 140/70 | HR 91 | Resp 14 | Ht 67.0 in | Wt 180.0 lb

## 2016-11-26 DIAGNOSIS — M25561 Pain in right knee: Secondary | ICD-10-CM

## 2016-11-26 DIAGNOSIS — M7061 Trochanteric bursitis, right hip: Secondary | ICD-10-CM | POA: Diagnosis not present

## 2016-11-26 DIAGNOSIS — M1711 Unilateral primary osteoarthritis, right knee: Secondary | ICD-10-CM

## 2016-11-26 MED ORDER — DICLOFENAC SODIUM 1 % TD GEL: 2.0000 g | Freq: Four times a day (QID) | TRANSDERMAL | 2 refills | Status: DC

## 2016-11-26 NOTE — Progress Notes (Signed)
Office Visit Note   Patient: Ashley Perkins           Date of Birth: Apr 06, 1940           MRN: [id number] Visit Date: 11/26/2016              Requested by: Ashley Ruff, MD [place], [place] [id number] PCP: Ashley Ruff, MD   Assessment & Plan: Visit Diagnoses:  1. Unilateral primary osteoarthritis, right knee   2. Right knee pain, unspecified chronicity   3. Trochanteric bursitis, right hip     Plan:  #1: Since she is less symptomatic at this time plan would be to proceed with conservative measures that of Voltaren gel. Prescription has been written #2: She may return if her symptoms do not continue to improve. May need to consider corticosteroid injection at that time. At this time she is not that symptomatic.  Face-to-face time spent with patient was greater than 30 minutes.  Greater than 50% of the time was spent in counseling and coordination of care.  Follow-Up Instructions: Return if symptoms worsen or fail to improve.   Orders:  Orders Placed This Encounter  Procedures  . XR KNEE 3 VIEW RIGHT   No orders of the defined types were placed in this encounter.     Procedures: No procedures performed   Clinical Data: No additional findings.   Subjective: Chief Complaint  Patient presents with  . Right Ankle - Pain    HPI  Ashley Perkins is a pleasant 76 year old white female who is seen today for evaluation of her right knee. She states approximately 2 weeks ago she was in bed and she was turning over and she had twisted her right knee at that time. She had pain and discomfort and neck she had to manipulate it to get it to straighten out. Since that time she had a progressive pain discomfort but now over the last several days since had marked resolution of her pain discomfort. She denies any neurovascular compromise. Denies any other recent history of injury or trauma.  She's also complaining of some pain that started somewhere at the mid  hemipelvis down past the trochanteric region on the right. This is bothersome but not debilitating. She denies any discomfort down to the knee or past the knee.  Review of Systems  Constitutional: Negative for chills, fatigue and fever.  HENT: Positive for tinnitus.   Eyes: Negative for itching.  Respiratory: Positive for shortness of breath. Negative for chest tightness.   Cardiovascular: Positive for palpitations. Negative for chest pain and leg swelling.  Gastrointestinal: Negative for blood in stool, constipation and diarrhea.  Endocrine: Negative for polyuria.  Genitourinary: Negative for dysuria.  Musculoskeletal: Negative for back pain, joint swelling, neck pain and neck stiffness.  Allergic/Immunologic: Negative for immunocompromised state.  Neurological: Negative for dizziness and numbness.  Hematological: Does not bruise/bleed easily.  Psychiatric/Behavioral: Positive for sleep disturbance. The patient is not nervous/anxious.      Objective: Vital Signs: BP 140/70   Pulse 91   Resp 14   Ht 5\' 7"  (1.702 m)   Wt 180 lb (81.6 kg)   BMI 28.19 kg/m   Physical Exam  Constitutional: She is oriented to person, place, and time. She appears well-developed and well-nourished.  HENT:  Head: Normocephalic and atraumatic.  Eyes: Pupils are equal, round, and reactive to light. EOM are normal.  Pulmonary/Chest: Effort normal.  Neurological: She is alert and oriented to person, place, and time.  Skin: Skin is warm and dry.  Psychiatric: She has a normal mood and affect. Her behavior is normal. Judgment and thought content normal.    Ortho Exam Today she has range of motion full extension to about 105. Little bit of tenderness medially. She does have a little bit posterior tenderness in the medial aspect of the knee. Small Baker cyst is palpable. There is not particular painful today. Ligamentously stable. Some patellofemoral crepitus with range of motion. Good hip motion. She does  have little bit of tenderness over the right trochanteric region. She also has some more proximal in the midportion of the hemipelvis. No pain over the crest.  Specialty Comments:  No specialty comments available.  Imaging: Xr Knee 3 View Right  Result Date: 11/26/2016 Three-view x-ray of the right knee reveals joint space narrowing by fluff 50% on the medial compartment. There is some irregularity and some periarticular spurring of the medial compartment. Also has some distal femoral spurring laterally. Also has some mild patellofemoral spurring and degenerative changes noted.    PMFS History: Patient Active Problem List   Diagnosis Date Noted  . Elevated liver function tests   . Generalized abdominal pain   . Depression   . Acute pancreatitis 05/07/2014  . Transaminitis 05/07/2014  . DM2 (diabetes mellitus, type 2) ([place]) 05/07/2014   Past Medical History:  Diagnosis Date  . Diabetes mellitus without complication ([place])   . Fibromyalgia   . Hypercholesteremia     No family history on file.  Past Surgical History:  Procedure Laterality Date  . BACK SURGERY    . CHOLECYSTECTOMY    . KNEE ARTHROSCOPY    . LAPAROSCOPIC UNILATERAL SALPINGO OOPHERECTOMY    . TUBAL LIGATION     Social History   Occupational History  . Not on file.   Social History Main Topics  . Smoking status: Never Smoker  . Smokeless tobacco: Never Used  . Alcohol use No  . Drug use: No  . Sexual activity: Yes

## 2016-11-27 DIAGNOSIS — I83819 Varicose veins of unspecified lower extremities with pain: Secondary | ICD-10-CM | POA: Diagnosis not present

## 2016-11-27 DIAGNOSIS — R002 Palpitations: Secondary | ICD-10-CM | POA: Diagnosis not present

## 2016-11-27 DIAGNOSIS — I10 Essential (primary) hypertension: Secondary | ICD-10-CM | POA: Diagnosis not present

## 2016-12-01 ENCOUNTER — Other Ambulatory Visit (HOSPITAL_COMMUNITY): Payer: Self-pay | Admitting: Cardiology

## 2016-12-01 DIAGNOSIS — I83813 Varicose veins of bilateral lower extremities with pain: Secondary | ICD-10-CM

## 2016-12-01 DIAGNOSIS — I872 Venous insufficiency (chronic) (peripheral): Secondary | ICD-10-CM

## 2016-12-02 ENCOUNTER — Ambulatory Visit
Admission: RE | Admit: 2016-12-02 | Discharge: 2016-12-02 | Disposition: A | Discharge status: Home / Self Care | Payer: Medicare HMO | Source: Ambulatory Visit | Attending: Cardiology | Admitting: Cardiology

## 2016-12-02 DIAGNOSIS — I872 Venous insufficiency (chronic) (peripheral): Secondary | ICD-10-CM

## 2016-12-02 DIAGNOSIS — I83813 Varicose veins of bilateral lower extremities with pain: Secondary | ICD-10-CM

## 2016-12-02 DIAGNOSIS — R600 Localized edema: Secondary | ICD-10-CM | POA: Diagnosis not present

## 2016-12-02 NOTE — Consult Note (Signed)
Chief Complaint: Patient was seen in consultation today for No chief complaint on file.  at the request of Patwardhan,Manish J  Referring Physician(s): Patwardhan,Manish J  History of Present Illness: Ashley Perkins is a 76 y.o. female with a long history of lower sure me pain and swelling. She denies any ulceration. The pain is achy and occurs towards the end of the day. She has a long history of sitting in a chair for long periods of time. She is gravida 4 para 4. She did take hormone replacement therapy but currently is not on any hormone medication. She has tried graded compression stockings without significant relief. She has not had any other form of treatment. She also describes visible varicosities in her legs.  Past Medical History:  Diagnosis Date  . Diabetes mellitus without complication ([place])   . Fibromyalgia   . Hypercholesteremia     Past Surgical History:  Procedure Laterality Date  . BACK SURGERY    . CHOLECYSTECTOMY    . KNEE ARTHROSCOPY    . LAPAROSCOPIC UNILATERAL SALPINGO OOPHERECTOMY    . TUBAL LIGATION      Allergies: Patient has no known allergies.  Medications: Prior to Admission medications   Medication Sig Start Date End Date Taking? Authorizing Provider  amitriptyline (ELAVIL) 25 MG tablet Take 1 tablet (25 mg total) by mouth at bedtime. Patient taking differently: Take 50 mg by mouth at bedtime.  05/09/14   Robbie Lis, MD  B Complex Vitamins (VITAMIN B COMPLEX PO) Take 1 tablet by mouth daily.    [provider]  Cyanocobalamin (VITAMIN B 12 PO) Take 1 tablet by mouth daily.    [provider]  cyclobenzaprine (FLEXERIL) 5 MG tablet  06/09/16   [provider]  diclofenac sodium (VOLTAREN) 1 % GEL Apply 2-4 g topically 4 (four) times daily. 11/26/16   Cherylann Ratel, PA-C  dicyclomine (BENTYL) 20 MG tablet Take 20 mg by mouth 3 (three) times daily as needed for spasms.  04/10/14   [provider]    glimepiride (AMARYL) 2 MG tablet  11/05/16   [provider]  glipiZIDE (GLUCOTROL XL) 10 MG 24 hr tablet Take 10 mg by mouth daily.    [provider]  HYDROcodone-acetaminophen (NORCO/VICODIN) 5-325 MG per tablet Take 0.5 tablets by mouth at bedtime.    [provider]  ibuprofen (ADVIL,MOTRIN) 200 MG tablet Take 200 mg by mouth every 6 (six) hours as needed for headache, mild pain or moderate pain.     [provider]  levothyroxine (SYNTHROID, LEVOTHROID) 88 MCG tablet Take 88 mcg by mouth daily before breakfast. 02/13/14   [provider]  metFORMIN (GLUCOPHAGE) 500 MG tablet Take 1,000 mg by mouth 2 (two) times daily with a meal.    [provider]  methylPREDNISolone (MEDROL DOSEPAK) 4 MG TBPK tablet Follow package directions. Patient not taking: Reported on 11/26/2016 08/18/16   Garald Balding, MD  Multiple Vitamin (MULTIVITAMIN WITH MINERALS) TABS Take 1 tablet by mouth daily.    [provider]  ondansetron (ZOFRAN) 4 MG tablet Take 1 tablet (4 mg total) by mouth every 6 (six) hours. 07/08/12   Teressa Lower, MD  pioglitazone (ACTOS) 15 MG tablet Take 15 mg by mouth 2 (two) times daily with a meal.  11/05/16   [provider]  traMADol (ULTRAM) 50 MG tablet Take 50 mg by mouth at bedtime.     [provider]  No family history on file.  Social History   Social History  . Marital status: Married    Spouse name: N/A  . Number of children: N/A  . Years of education: N/A   Social History Main Topics  . Smoking status: Never Smoker  . Smokeless tobacco: Never Used  . Alcohol use No  . Drug use: No  . Sexual activity: Yes   Other Topics Concern  . Not on file   Social History Narrative  . No narrative on file     Review of Systems: A 12 point ROS discussed and pertinent positives are indicated in the HPI above.  All other systems are negative.  Review of Systems  Vital Signs: There were  no vitals taken for this visit.  Physical Exam  Constitutional: She is oriented to person, place, and time. She appears well-developed and well-nourished.  Musculoskeletal:  Examination of the right lower extremity demonstrates scattered spider veins and faint varicosities along the medial calf. There is no skin breakdown and no edema. Extremity is warm and pink. Pulses are intact distally.  Examination of the left lower chin demonstrates extensive varicosities along the medial thigh, medial calf, and posterior calf. There is no skin breakdown. Pulses are intact distally. 1+ edema at the ankle.  Neurological: She is alert and oriented to person, place, and time.     Imaging: US Venous Img Lower Bilateral  Result Date: 12/02/2016 CLINICAL DATA:  Lower extremity edema pain EXAM: BILATERAL LOWER EXTREMITY VENOUS DUPLEX ULTRASOUND TECHNIQUE: Gray-scale sonography with graded compression, as well as color Doppler and duplex ultrasound, were performed to evaluate the deep and superficial veins of both lower extremities. Spectral Doppler was utilized to evaluate flow at rest and with distal augmentation maneuvers. A complete superficial venous insufficiency exam was performed in the upright standing/reverse Trendelenburg position. I personally performed/supervised the technical portion of the exam. COMPARISON:  None. FINDINGS: RIGHT Deep Venous System: Evaluation of the deep venous system including the common femoral, femoral, profunda femoral, popliteal and calf veins (where visible) demonstrate no evidence of deep venous thrombosis. The vessels are compressible and demonstrate normal respiratory phasicity and response to augmentation. No evidence of deep venous reflux. RIGHT Superficial Venous System: SFJ: No reflux GSV Prox Thigh: No reflux GSV Mid Thigh: No re- flow GSV Lower Thigh: No reflux GSV Prox Calf: Positive reflux lasting 3.2 seconds. The vein is 3 mm in caliber. GSV Mid Calf: Positive for  reflux lasting 3.2 seconds. 3 mm in caliber. GSV Distal Calf: No reflux SPJ: No reflux SSV Prox: No reflux SSV Mid: No reflux SSV Distal: No reflux Other: There varicosities in the medial mid calf LEFT Deep Venous System: Evaluation of the deep venous system including the common femoral, femoral, profunda femoral, popliteal and calf veins (where visible) demonstrate no evidence of deep venous thrombosis. The vessels are compressible and demonstrate normal respiratory phasicity and response to augmentation. No evidence of deep venous reflux. LEFT Superficial Venous System: SFJ: The greater saphenous vein is duplicated. The more medial greater saphenous vein has a straight course. The lateral branch is extremely tortuous. GSV Prox Thigh: No reflux in the medial branch. There is reflux in the lateral branch lasting 4.3 seconds. 6 mm in caliber. GSV Mid Thigh: No reflux in the medial branch. There is reflux in the lateral branch GSV Lower Thigh: No reflux in the medial branch. There is reflux in the lateral branch. GSV Prox Calf: The 2 branches re- unite to a single branch.  There is reflux lasting 3.8 seconds. The vein is 4 mm in caliber. GSV Mid Calf: There is reflux lasting 2.4 seconds. GSV Distal Calf: Not visualized SPJ: No reflux SSV Prox: There is reflux lasting 3.1 seconds. The vein is 3 mm in caliber. SSV Mid: There is reflux lasting 2 seconds. SSV Distal: Not visualized. Other: The greater saphenous vein is duplicated in the thigh. The medial branch has a straight course without reflux. The more lateral branch is duplicated with a tortuous course. Branches unite at the knee and there is reflux in the greater saphenous vein within the calf. There are varicosities along the medial thigh, medial calf, and posterior calf. There is also reflux in the lesser saphenous vein as described above. IMPRESSION: No evidence of DVT. There is localized reflux within the right greater saphenous vein in the calf only associated  with varicosities. There is extensive reflux within both the greater and lesser saphenous veins of the left lower extremity. The greater saphenous vein is duplicated in the thigh as described. There are associated varicosities in the thigh and calf. Electronically Signed   By: Marybelle Killings M.D.   On: 12/02/2016 12:34   [place] [facility] Eval And Mgmt  Result Date: 12/02/2016 Please refer to "Notes" to see consult details.  Xr Knee 3 View Right  Result Date: 11/26/2016 Three-view x-ray of the right knee reveals joint space narrowing by fluff 50% on the medial compartment. There is some irregularity and some periarticular spurring of the medial compartment. Also has some distal femoral spurring laterally. Also has some mild patellofemoral spurring and degenerative changes noted.   Labs:  CBC: No results for input(s): WBC, HGB, HCT, PLT in the last 8760 hours.  COAGS: No results for input(s): INR, APTT in the last 8760 hours.  BMP: No results for input(s): NA, K, CL, CO2, GLUCOSE, BUN, CALCIUM, CREATININE, GFRNONAA, GFRAA in the last 8760 hours.  Invalid input(s): CMP  LIVER FUNCTION TESTS: No results for input(s): BILITOT, AST, ALT, ALKPHOS, PROT, ALBUMIN in the last 8760 hours.  TUMOR MARKERS: No results for input(s): AFPTM, CEA, CA199, CHROMGRNA in the last 8760 hours.  Assessment and Plan:  And left lower sure me, there is extensive reflux in the greater and lesser saphenous veins. These are associated with varicosities. In the right lower extremity, there is mild reflux in the mid calf, within the greater saphenous vein and associated varicosities. Laser ablation of the left greater and lesser saphenous veins is recommended. Sclerotherapy only in the right calf is recommended.  Thank you for this interesting consult.  I greatly enjoyed meeting Ashley Perkins and look forward to participating in their care.  A copy of this report was sent to the requesting provider on this  date.  Electronically Signed: Davide Risdon, ART A 12/02/2016, 12:37 PM   I spent a total of  40 Minutes   in face to face in clinical consultation, greater than 50% of which was counseling/coordinating care for lower extremity venous insufficiency.

## 2016-12-07 ENCOUNTER — Telehealth (INDEPENDENT_AMBULATORY_CARE_PROVIDER_SITE_OTHER): Payer: Self-pay | Admitting: Orthopaedic Surgery

## 2016-12-07 NOTE — Telephone Encounter (Signed)
Patient left a message requesting a rx for Lidocaine Patches 5% sent to [facility] on [address]. Patient states she signed a Narcotic agreement with her GP, but GP advised her to call here for patches for her side pain. Please call to advise patient.

## 2016-12-07 NOTE — Telephone Encounter (Signed)
ok 

## 2016-12-07 NOTE — Telephone Encounter (Signed)
Please advise 

## 2016-12-08 ENCOUNTER — Other Ambulatory Visit (INDEPENDENT_AMBULATORY_CARE_PROVIDER_SITE_OTHER): Payer: Self-pay

## 2016-12-08 MED ORDER — LIDOCAINE 5 % EX PTCH: 1.0000 | MEDICATED_PATCH | CUTANEOUS | 0 refills | Status: DC

## 2016-12-08 NOTE — Telephone Encounter (Signed)
done

## 2016-12-18 DIAGNOSIS — E039 Hypothyroidism, unspecified: Secondary | ICD-10-CM | POA: Diagnosis not present

## 2016-12-18 DIAGNOSIS — G479 Sleep disorder, unspecified: Secondary | ICD-10-CM | POA: Diagnosis not present

## 2016-12-18 DIAGNOSIS — Z7984 Long term (current) use of oral hypoglycemic drugs: Secondary | ICD-10-CM | POA: Diagnosis not present

## 2016-12-18 DIAGNOSIS — E7800 Pure hypercholesterolemia, unspecified: Secondary | ICD-10-CM | POA: Diagnosis not present

## 2016-12-18 DIAGNOSIS — M25561 Pain in right knee: Secondary | ICD-10-CM | POA: Diagnosis not present

## 2016-12-18 DIAGNOSIS — Z23 Encounter for immunization: Secondary | ICD-10-CM | POA: Diagnosis not present

## 2016-12-18 DIAGNOSIS — E119 Type 2 diabetes mellitus without complications: Secondary | ICD-10-CM | POA: Diagnosis not present

## 2016-12-24 ENCOUNTER — Telehealth (INDEPENDENT_AMBULATORY_CARE_PROVIDER_SITE_OTHER): Payer: Self-pay | Admitting: Orthopaedic Surgery

## 2016-12-24 NOTE — Telephone Encounter (Signed)
I called pt 2 x and called [employer] and I could not get an authorization

## 2016-12-24 NOTE — Telephone Encounter (Signed)
Patient states  a prescription for lidocaine pain patches was sent to [facility] and her insurance denied them because a prior authorization was not obtained. Patient provided this number to obtain a prior auth: [id number]

## 2016-12-26 DIAGNOSIS — R002 Palpitations: Secondary | ICD-10-CM | POA: Diagnosis not present

## 2017-01-21 DIAGNOSIS — I10 Essential (primary) hypertension: Secondary | ICD-10-CM | POA: Diagnosis not present

## 2017-01-21 DIAGNOSIS — R002 Palpitations: Secondary | ICD-10-CM | POA: Diagnosis not present

## 2017-01-21 DIAGNOSIS — I83819 Varicose veins of unspecified lower extremities with pain: Secondary | ICD-10-CM | POA: Diagnosis not present

## 2017-02-08 DIAGNOSIS — H26491 Other secondary cataract, right eye: Secondary | ICD-10-CM | POA: Diagnosis not present

## 2017-02-08 DIAGNOSIS — R69 Illness, unspecified: Secondary | ICD-10-CM | POA: Diagnosis not present

## 2017-02-26 DIAGNOSIS — M1711 Unilateral primary osteoarthritis, right knee: Secondary | ICD-10-CM | POA: Diagnosis not present

## 2017-03-05 DIAGNOSIS — M1711 Unilateral primary osteoarthritis, right knee: Secondary | ICD-10-CM | POA: Diagnosis not present

## 2017-03-11 DIAGNOSIS — M797 Fibromyalgia: Secondary | ICD-10-CM | POA: Diagnosis not present

## 2017-03-11 DIAGNOSIS — J029 Acute pharyngitis, unspecified: Secondary | ICD-10-CM | POA: Diagnosis not present

## 2017-03-11 DIAGNOSIS — J069 Acute upper respiratory infection, unspecified: Secondary | ICD-10-CM | POA: Diagnosis not present

## 2017-03-11 DIAGNOSIS — M1990 Unspecified osteoarthritis, unspecified site: Secondary | ICD-10-CM | POA: Diagnosis not present

## 2017-03-17 DIAGNOSIS — R69 Illness, unspecified: Secondary | ICD-10-CM | POA: Diagnosis not present

## 2017-03-26 DIAGNOSIS — M25561 Pain in right knee: Secondary | ICD-10-CM | POA: Diagnosis not present

## 2017-03-26 DIAGNOSIS — S83281D Other tear of lateral meniscus, current injury, right knee, subsequent encounter: Secondary | ICD-10-CM | POA: Diagnosis not present

## 2017-03-26 DIAGNOSIS — S83289A Other tear of lateral meniscus, current injury, unspecified knee, initial encounter: Secondary | ICD-10-CM | POA: Insufficient documentation

## 2017-03-29 NOTE — Progress Notes (Signed)
Office Visit Note  Patient: Ashley Perkins             Date of Birth: Dec 11, 1940           MRN: [id number]             PCP: Leighton Ruff, MD Referring: Leighton Ruff, MD Visit Date: 03/30/2017 Occupation: @GUAROCC @    Subjective:  Insomnia    History of Present Illness: Ashley Perkins is a 77 y.o. female with history of fibromyalgia, DDD, and osteoarthritis.  She states that she has been having increased insomnia.  She takes Amitriptyline at night and has tried Melatonin.  The melatonin caused confusion.  She states she used to take Flexeril which helped.  She states she is having increased pain in her bilateral CMC joints.  She has not tried CMC braces.  She uses voltaren gel occasionally, which provides benefit.  She needs a refill.  She states that her bilateral trochanteric bursitis has been flaring.  She states she got a new mattress topper and uses voltaren gel.  She attends water aerobics twice weekly, which has been helping with her joint pain.  She continues to have muscle spasms in her lower extremities.  She uses OTC hyland products which help temporarily.  She has tried magnesium malate in the past.  She states that she has occasional discomfort in her neck but performs neck exercises regularly.    Activities of Daily Living:  Patient reports morning stiffness for 30 minutes.   Patient Reports nocturnal pain.  Difficulty dressing/grooming: Denies Difficulty climbing stairs: Denies Difficulty getting out of chair: Denies Difficulty using hands for taps, buttons, cutlery, and/or writing: Reports   Review of Systems  Constitutional: Positive for fatigue. Negative for weakness.  HENT: Negative for mouth sores, mouth dryness and nose dryness.   Eyes: Negative for dryness.  Respiratory: Negative for cough and shortness of breath.   Cardiovascular: Negative for chest pain, palpitations, hypertension and swelling in legs/feet.  Gastrointestinal: Negative for blood in  stool, constipation and diarrhea.  Genitourinary: Negative for painful urination.  Musculoskeletal: Positive for arthralgias, joint pain, joint swelling, myalgias, muscle weakness, morning stiffness, muscle tenderness and myalgias.  Skin: Negative.  Negative for rash, hair loss and sensitivity to sunlight.  Neurological: Negative for dizziness, numbness and headaches.  Psychiatric/Behavioral: Positive for sleep disturbance. Negative for depressed mood.    PMFS History:  Patient Active Problem List   Diagnosis Date Noted  . Elevated liver function tests   . Generalized abdominal pain   . Depression   . Acute pancreatitis 05/07/2014  . Transaminitis 05/07/2014  . DM2 (diabetes mellitus, type 2) ([place]) 05/07/2014    Past Medical History:  Diagnosis Date  . Diabetes mellitus without complication ([place])   . Fibromyalgia   . Hypercholesteremia     History reviewed. No pertinent family history. Past Surgical History:  Procedure Laterality Date  . BACK SURGERY    . CHOLECYSTECTOMY    . KNEE ARTHROSCOPY    . LAPAROSCOPIC UNILATERAL SALPINGO OOPHERECTOMY    . TUBAL LIGATION     Social History   Social History Narrative  . Not on file     Objective: Vital Signs: BP 109/64 (BP Location: Left Arm, Patient Position: Sitting, Cuff Size: Normal)   Pulse 78   Ht 5\' 8"  (1.727 m)   Wt 188 lb (85.3 kg)   BMI 28.59 kg/m    Physical Exam  Constitutional: She is oriented to person, place, and time. She  appears well-developed and well-nourished.  HENT:  Head: Normocephalic and atraumatic.  Eyes: Conjunctivae and EOM are normal.  Neck: Normal range of motion.  Cardiovascular: Normal rate, regular rhythm, normal heart sounds and intact distal pulses.  Pulmonary/Chest: Effort normal and breath sounds normal.  Abdominal: Soft. Bowel sounds are normal.  Lymphadenopathy:    She has no cervical adenopathy.  Neurological: She is alert and oriented to person, place, and time.  Skin: Skin is  warm and dry. Capillary refill takes less than 2 seconds.  Psychiatric: She has a normal mood and affect. Her behavior is normal.  Nursing note and vitals reviewed.    Musculoskeletal Exam: C-spine, thoracic, and lumbar spine good ROM.  Shoulder joints, elbow joints, wrist joints, MCPs, PIPs, and DIPs good ROM with no synovitis.  Mild PIP and DIP synovial thickening.  Calvert joint prominence bilaterally consistent with osteoarthritis.  Hip joints, knee joints, ankle joints, MTPs, PIPs, and DIPs good ROM with no synovitis.  Bilateral tenderness of trochanteric bursa.  Mild SI joint tenderness.    CDAI Exam: No CDAI exam completed.    Investigation: No additional findings.   Imaging: No results found.  Speciality Comments: No specialty comments available.    Procedures:  No procedures performed Allergies: Other   Assessment / Plan:     Visit Diagnoses: Fibromyalgia: She has been having worsening insomnia.  She takes Amitriptyline at night.  She has tried melatonin, which caused confusion.  She was given a prescription for Zanaflex.  She has been having muscle spasms in her bilateral lower extremities.  She takes Hyland OTC products for leg cramps.  She can take the Zanaflex 4 mg at bedtime as needed.  She has tenderness of bilateral trochanteric bursa.  She was given a handout for trochanteric bursitis exercises.    Primary osteoarthritis of both hands: She has bilateral CMC joint pain.  She was given a prescription for [facility] braces.  She was also given a refill of Voltaren gel.  She was given a handout of hand exercises.  Joint protection and muscle strengthening were discussed.  Primary osteoarthritis of both knees: She has good ROM with no effusion or warmth of her bilateral knees.  She can used voltaren gel on her knee joints as needed.    DDD (degenerative disc disease), cervical: She has good ROM on exam.  She experiences occasional discomfort and trapezius muscle tension.  She  performs neck exercises on a regular basis.  DDD (degenerative disc disease), lumbar - s/p discectomy: Stable   Other medical conditions are listed as follows:   History of diabetes mellitus  History of hypercholesterolemia  History of hypothyroidism  History of pancreatitis  History of knee surgery - right, meniscal tear repair     Orders: No orders of the defined types were placed in this encounter.  Meds ordered this encounter  Medications  . diclofenac sodium (VOLTAREN) 1 % GEL    Sig: Apply 3 grams to three large joints up to 3 times daily    Dispense:  3 Tube    Refill:  3  . tiZANidine (ZANAFLEX) 4 MG tablet    Sig: Take 1 tablet (4 mg total) by mouth at bedtime as needed for muscle spasms.    Dispense:  30 tablet    Refill:  1    Face-to-face time spent with patient was 30 minutes. Greater than 50% of time was spent in counseling and coordination of care.  Follow-Up Instructions: Return in about 6 months (  around 09/27/2017) for Osteoarthritis, Fibromyalgia.   Bo Merino, MD  Note - This record has been created using Editor, commissioning.  Chart creation errors have been sought, but may not always  have been located. Such creation errors do not reflect on  the standard of medical care.

## 2017-03-30 ENCOUNTER — Encounter (INDEPENDENT_AMBULATORY_CARE_PROVIDER_SITE_OTHER): Payer: Self-pay

## 2017-03-30 ENCOUNTER — Ambulatory Visit: Payer: Medicare HMO | Admitting: Rheumatology

## 2017-03-30 ENCOUNTER — Encounter: Payer: Self-pay | Admitting: Rheumatology

## 2017-03-30 VITALS — BP 109/64 | HR 78 | Ht 68.0 in | Wt 188.0 lb

## 2017-03-30 DIAGNOSIS — M19042 Primary osteoarthritis, left hand: Secondary | ICD-10-CM | POA: Diagnosis not present

## 2017-03-30 DIAGNOSIS — M5136 Other intervertebral disc degeneration, lumbar region: Secondary | ICD-10-CM | POA: Diagnosis not present

## 2017-03-30 DIAGNOSIS — Z8639 Personal history of other endocrine, nutritional and metabolic disease: Secondary | ICD-10-CM | POA: Diagnosis not present

## 2017-03-30 DIAGNOSIS — M170 Bilateral primary osteoarthritis of knee: Secondary | ICD-10-CM | POA: Diagnosis not present

## 2017-03-30 DIAGNOSIS — M797 Fibromyalgia: Secondary | ICD-10-CM

## 2017-03-30 DIAGNOSIS — M19041 Primary osteoarthritis, right hand: Secondary | ICD-10-CM

## 2017-03-30 DIAGNOSIS — Z8719 Personal history of other diseases of the digestive system: Secondary | ICD-10-CM

## 2017-03-30 DIAGNOSIS — M5030 Other cervical disc degeneration, unspecified cervical region: Secondary | ICD-10-CM | POA: Diagnosis not present

## 2017-03-30 DIAGNOSIS — Z9889 Other specified postprocedural states: Secondary | ICD-10-CM

## 2017-03-30 MED ORDER — DICLOFENAC SODIUM 1 % TD GEL: TRANSDERMAL | 3 refills | Status: DC

## 2017-03-30 MED ORDER — TIZANIDINE HCL 4 MG PO TABS: 4.0000 mg | ORAL_TABLET | Freq: Every evening | ORAL | 1 refills | Status: DC | PRN

## 2017-03-30 NOTE — Patient Instructions (Addendum)
Hand Exercises Hand exercises can be helpful to almost anyone. These exercises can strengthen the hands, improve flexibility and movement, and increase blood flow to the hands. These results can make work and daily tasks easier. Hand exercises can be especially helpful for people who have joint pain from arthritis or have nerve damage from overuse (carpal tunnel syndrome). These exercises can also help people who have injured a hand. Most of these hand exercises are fairly gentle stretching routines. You can do them often throughout the day. Still, it is a good idea to ask your health care provider which exercises would be best for you. Warming your hands before exercise may help to reduce stiffness. You can do this with gentle massage or by placing your hands in warm water for 15 minutes. Also, make sure you pay attention to your level of hand pain as you begin an exercise routine. Exercises Knuckle Bend Repeat this exercise 5-10 times with each hand. 1. Stand or sit with your arm, hand, and all five fingers pointed straight up. Make sure your wrist is straight. 2. Gently and slowly bend your fingers down and inward until the tips of your fingers are touching the tops of your palm. 3. Hold this position for a few seconds. 4. Extend your fingers out to their original position, all pointing straight up again.  Finger Fan Repeat this exercise 5-10 times with each hand. 1. Hold your arm and hand out in front of you. Keep your wrist straight. 2. Squeeze your hand into a fist. 3. Hold this position for a few seconds. 4. Fan out, or spread apart, your hand and fingers as much as possible, stretching every joint fully.  Tabletop Repeat this exercise 5-10 times with each hand. 1. Stand or sit with your arm, hand, and all five fingers pointed straight up. Make sure your wrist is straight. 2. Gently and slowly bend your fingers at the knuckles where they meet the hand until your hand is making an  upside-down L shape. Your fingers should form a tabletop. 3. Hold this position for a few seconds. 4. Extend your fingers out to their original position, all pointing straight up again.  Making Os Repeat this exercise 5-10 times with each hand. 1. Stand or sit with your arm, hand, and all five fingers pointed straight up. Make sure your wrist is straight. 2. Make an O shape by touching your pointer finger to your thumb. Hold for a few seconds. Then open your hand wide. 3. Repeat this motion with each finger on your hand.  Table Spread Repeat this exercise 5-10 times with each hand. 1. Place your hand on a table with your palm facing down. Make sure your wrist is straight. 2. Spread your fingers out as much as possible. Hold this position for a few seconds. 3. Slide your fingers back together again. Hold for a few seconds.  Ball Grip  Repeat this exercise 10-15 times with each hand. 1. Hold a tennis ball or another soft ball in your hand. 2. While slowly increasing pressure, squeeze the ball as hard as possible. 3. Squeeze as hard as you can for 3-5 seconds. 4. Relax and repeat.  Wrist Curls Repeat this exercise 10-15 times with each hand. 1. Sit in a chair that has armrests. 2. Hold a light weight in your hand, such as a dumbbell that weighs 1-3 pounds (0.5-1.4 kg). Ask your health care provider what weight would be best for you. 3. Rest your hand just over   the end of the chair arm with your palm facing up. 4. Gently pivot your wrist up and down while holding the weight. Do not twist your wrist from side to side.  Contact a health care provider if:  Your hand pain or discomfort gets much worse when you do an exercise.  Your hand pain or discomfort does not improve within 2 hours after you exercise. If you have any of these problems, stop doing these exercises right away. Do not do them again unless your health care provider says that you can. Get help right away if:  You  develop sudden, severe hand pain. If this happens, stop doing these exercises right away. Do not do them again unless your health care provider says that you can. This information is not intended to replace advice given to you by your health care provider. Make sure you discuss any questions you have with your health care provider. Document Released: 02/11/2015 Document Revised: 08/08/2015 Document Reviewed: 09/10/2014 Elsevier Interactive Patient Education  2018 [place]. Trochanteric Bursitis Rehab Ask your health care provider which exercises are safe for you. Do exercises exactly as told by your health care provider and adjust them as directed. It is normal to feel mild stretching, pulling, tightness, or discomfort as you do these exercises, but you should stop right away if you feel sudden pain or your pain gets worse.Do not begin these exercises until told by your health care provider. Stretching exercises These exercises warm up your muscles and joints and improve the movement and flexibility of your hip. These exercises also help to relieve pain and stiffness. Exercise A: Iliotibial band stretch  1. Lie on your side with your left / right leg in the top position. 2. Bend your left / right knee and grab your ankle. 3. Slowly bring your knee back so your thigh is behind your body. 4. Slowly lower your knee toward the floor until you feel a gentle stretch on the outside of your left / right thigh. If you do not feel a stretch and your knee will not fall farther, place the heel of your other foot on top of your outer knee and pull your thigh down farther. 5. Hold this position for __________ seconds. 6. Slowly return to the starting position. Repeat __________ times. Complete this exercise __________ times a day. Strengthening exercises These exercises build strength and endurance in your hip and pelvis. Endurance is the ability to use your muscles for a long time, even after they get  tired. Exercise B: Bridge ( hip extensors) 1. Lie on your back on a firm surface with your knees bent and your feet flat on the floor. 2. Tighten your buttocks muscles and lift your buttocks off the floor until your trunk is level with your thighs. You should feel the muscles working in your buttocks and the back of your thighs. If this exercise is too easy, try doing it with your arms crossed over your chest. 3. Hold this position for __________ seconds. 4. Slowly return to the starting position. 5. Let your muscles relax completely between repetitions. Repeat __________ times. Complete this exercise __________ times a day. Exercise C: Squats ( knee extensors and  quadriceps) 1. Stand in front of a table, with your feet and knees pointing straight ahead. You may rest your hands on the table for balance but not for support. 2. Slowly bend your knees and lower your hips like you are going to sit in a chair. ? Keep  your weight over your heels, not over your toes. ? Keep your lower legs upright so they are parallel with the table legs. ? Do not let your hips go lower than your knees. ? Do not bend lower than told by your health care provider. ? If your hip pain increases, do not bend as low. 3. Hold this position for __________ seconds. 4. Slowly push with your legs to return to standing. Do not use your hands to pull yourself to standing. Repeat __________ times. Complete this exercise __________ times a day. Exercise D: Hip hike 1. Stand sideways on a bottom step. Stand on your left / right leg with your other foot unsupported next to the step. You can hold onto the railing or wall if needed for balance. 2. Keeping your knees straight and your torso square, lift your left / right hip up toward the ceiling. 3. Hold this position for __________ seconds. 4. Slowly let your left / right hip lower toward the floor, past the starting position. Your foot should get closer to the floor. Do not lean  or bend your knees. Repeat __________ times. Complete this exercise __________ times a day. Exercise E: Single leg stand 1. Stand near a counter or door frame that you can hold onto for balance as needed. It is helpful to stand in front of a mirror for this exercise so you can watch your hip. 2. Squeeze your left / right buttock muscles then lift up your other foot. Do not let your left / right hip push out to the side. 3. Hold this position for __________ seconds. Repeat __________ times. Complete this exercise __________ times a day. This information is not intended to replace advice given to you by your health care provider. Make sure you discuss any questions you have with your health care provider. Document Released: 04/09/2004 Document Revised: 11/07/2015 Document Reviewed: 02/15/2015 Elsevier Interactive Patient Education  [employer].

## 2017-03-31 ENCOUNTER — Telehealth: Payer: Self-pay | Admitting: Rheumatology

## 2017-03-31 NOTE — Telephone Encounter (Signed)
Patient left a voicemail stating that she was prescribed a muscle relaxer for her muscle spasms but it isn't working.  She needs another medicine so she will be able to sleep at night.  CB [phone]

## 2017-03-31 NOTE — Telephone Encounter (Signed)
Patient left a voicemail stating that she was prescribed a muscle relaxer for her muscle spasms but it isn't working.  She needs another medicine so she will be able to sleep at night. Patient was given a prescription for Zanaflex at her visit on 03/31/17

## 2017-04-01 ENCOUNTER — Other Ambulatory Visit: Payer: Self-pay | Admitting: Physician Assistant

## 2017-04-01 MED ORDER — CYCLOBENZAPRINE HCL 5 MG PO TABS: 5.0000 mg | ORAL_TABLET | Freq: Every evening | ORAL | 0 refills | Status: DC | PRN

## 2017-04-01 NOTE — Telephone Encounter (Signed)
She should discontinue use of Zanaflex.  We will send in a 30 day supply of Flexeril 5 mg po PRN.

## 2017-04-01 NOTE — Telephone Encounter (Signed)
Patient states when she took the Zanaflex she had pain in all of her joints and sh woke up with cramping in her legs which had her jump out of the bed 4-5 times. Patient states she had an odd sensation on her skin. Patient states she will use the Magnesium Malate but will not continue the Zanaflex.

## 2017-04-01 NOTE — Telephone Encounter (Signed)
I would recommend trying Zanaflex for a couple more weeks to see if she notices any benefit overtime.   She can also try magnesium malate 250 mg daily for muscle spasms.  Please inform her that the side effects include diarrhea and drowsiness.  She can take Magnesium malate at night.  If she can tolerate the 250 mg dose, she can increase to 500 mg at bedtime.

## 2017-04-02 NOTE — Telephone Encounter (Signed)
Patient advised to discontinue Zanaflex and a prescription for Flexeril 5 mg was sent to the pharmacy for hr to use as needed.

## 2017-04-07 DIAGNOSIS — M797 Fibromyalgia: Secondary | ICD-10-CM | POA: Diagnosis not present

## 2017-04-07 DIAGNOSIS — G479 Sleep disorder, unspecified: Secondary | ICD-10-CM | POA: Diagnosis not present

## 2017-04-07 DIAGNOSIS — S83209D Unspecified tear of unspecified meniscus, current injury, unspecified knee, subsequent encounter: Secondary | ICD-10-CM | POA: Diagnosis not present

## 2017-04-07 DIAGNOSIS — M1990 Unspecified osteoarthritis, unspecified site: Secondary | ICD-10-CM | POA: Diagnosis not present

## 2017-04-08 DIAGNOSIS — R69 Illness, unspecified: Secondary | ICD-10-CM | POA: Diagnosis not present

## 2017-04-14 DIAGNOSIS — M797 Fibromyalgia: Secondary | ICD-10-CM | POA: Diagnosis not present

## 2017-04-14 DIAGNOSIS — Z1331 Encounter for screening for depression: Secondary | ICD-10-CM | POA: Diagnosis not present

## 2017-04-14 DIAGNOSIS — E119 Type 2 diabetes mellitus without complications: Secondary | ICD-10-CM | POA: Diagnosis not present

## 2017-04-14 DIAGNOSIS — Z9149 Other personal history of psychological trauma, not elsewhere classified: Secondary | ICD-10-CM | POA: Insufficient documentation

## 2017-04-14 DIAGNOSIS — E039 Hypothyroidism, unspecified: Secondary | ICD-10-CM | POA: Insufficient documentation

## 2017-04-14 DIAGNOSIS — M2550 Pain in unspecified joint: Secondary | ICD-10-CM | POA: Diagnosis not present

## 2017-04-15 DIAGNOSIS — M2550 Pain in unspecified joint: Secondary | ICD-10-CM | POA: Diagnosis not present

## 2017-04-15 DIAGNOSIS — E119 Type 2 diabetes mellitus without complications: Secondary | ICD-10-CM | POA: Diagnosis not present

## 2017-04-15 DIAGNOSIS — E039 Hypothyroidism, unspecified: Secondary | ICD-10-CM | POA: Diagnosis not present

## 2017-04-23 DIAGNOSIS — M1712 Unilateral primary osteoarthritis, left knee: Secondary | ICD-10-CM | POA: Diagnosis not present

## 2017-04-23 DIAGNOSIS — M170 Bilateral primary osteoarthritis of knee: Secondary | ICD-10-CM | POA: Diagnosis not present

## 2017-04-29 DIAGNOSIS — E2839 Other primary ovarian failure: Secondary | ICD-10-CM | POA: Diagnosis not present

## 2017-04-29 DIAGNOSIS — R252 Cramp and spasm: Secondary | ICD-10-CM | POA: Diagnosis not present

## 2017-05-05 DIAGNOSIS — Z79891 Long term (current) use of opiate analgesic: Secondary | ICD-10-CM | POA: Insufficient documentation

## 2017-05-05 DIAGNOSIS — M170 Bilateral primary osteoarthritis of knee: Secondary | ICD-10-CM | POA: Diagnosis not present

## 2017-05-05 DIAGNOSIS — M797 Fibromyalgia: Secondary | ICD-10-CM | POA: Diagnosis not present

## 2017-05-05 DIAGNOSIS — G894 Chronic pain syndrome: Secondary | ICD-10-CM | POA: Diagnosis not present

## 2017-05-05 DIAGNOSIS — M47812 Spondylosis without myelopathy or radiculopathy, cervical region: Secondary | ICD-10-CM | POA: Diagnosis not present

## 2017-05-05 DIAGNOSIS — M47816 Spondylosis without myelopathy or radiculopathy, lumbar region: Secondary | ICD-10-CM | POA: Insufficient documentation

## 2017-05-06 DIAGNOSIS — R6889 Other general symptoms and signs: Secondary | ICD-10-CM | POA: Diagnosis not present

## 2017-05-06 DIAGNOSIS — J101 Influenza due to other identified influenza virus with other respiratory manifestations: Secondary | ICD-10-CM | POA: Diagnosis not present

## 2017-05-10 DIAGNOSIS — R413 Other amnesia: Secondary | ICD-10-CM | POA: Diagnosis not present

## 2017-05-10 DIAGNOSIS — Z0000 Encounter for general adult medical examination without abnormal findings: Secondary | ICD-10-CM | POA: Diagnosis not present

## 2017-05-13 DIAGNOSIS — J189 Pneumonia, unspecified organism: Secondary | ICD-10-CM | POA: Diagnosis not present

## 2017-05-15 DIAGNOSIS — R69 Illness, unspecified: Secondary | ICD-10-CM | POA: Diagnosis not present

## 2017-05-20 DIAGNOSIS — J1100 Influenza due to unidentified influenza virus with unspecified type of pneumonia: Secondary | ICD-10-CM | POA: Diagnosis not present

## 2017-05-28 DIAGNOSIS — M85851 Other specified disorders of bone density and structure, right thigh: Secondary | ICD-10-CM | POA: Diagnosis not present

## 2017-05-28 DIAGNOSIS — E2839 Other primary ovarian failure: Secondary | ICD-10-CM | POA: Diagnosis not present

## 2017-05-31 DIAGNOSIS — M8580 Other specified disorders of bone density and structure, unspecified site: Secondary | ICD-10-CM | POA: Insufficient documentation

## 2017-06-02 DIAGNOSIS — M170 Bilateral primary osteoarthritis of knee: Secondary | ICD-10-CM | POA: Diagnosis not present

## 2017-06-02 DIAGNOSIS — M797 Fibromyalgia: Secondary | ICD-10-CM | POA: Diagnosis not present

## 2017-06-02 DIAGNOSIS — M47816 Spondylosis without myelopathy or radiculopathy, lumbar region: Secondary | ICD-10-CM | POA: Diagnosis not present

## 2017-06-02 DIAGNOSIS — G894 Chronic pain syndrome: Secondary | ICD-10-CM | POA: Diagnosis not present

## 2017-06-07 HISTORY — PX: KNEE ARTHROSCOPY: SUR90

## 2017-06-08 DIAGNOSIS — G8918 Other acute postprocedural pain: Secondary | ICD-10-CM | POA: Diagnosis not present

## 2017-06-08 DIAGNOSIS — M23262 Derangement of other lateral meniscus due to old tear or injury, left knee: Secondary | ICD-10-CM | POA: Diagnosis not present

## 2017-06-08 DIAGNOSIS — M94262 Chondromalacia, left knee: Secondary | ICD-10-CM | POA: Diagnosis not present

## 2017-06-08 DIAGNOSIS — S83241A Other tear of medial meniscus, current injury, right knee, initial encounter: Secondary | ICD-10-CM | POA: Diagnosis not present

## 2017-06-16 DIAGNOSIS — G4700 Insomnia, unspecified: Secondary | ICD-10-CM | POA: Diagnosis not present

## 2017-06-24 DIAGNOSIS — R69 Illness, unspecified: Secondary | ICD-10-CM | POA: Diagnosis not present

## 2017-07-01 DIAGNOSIS — M797 Fibromyalgia: Secondary | ICD-10-CM | POA: Diagnosis not present

## 2017-07-01 DIAGNOSIS — M170 Bilateral primary osteoarthritis of knee: Secondary | ICD-10-CM | POA: Diagnosis not present

## 2017-07-01 DIAGNOSIS — M47816 Spondylosis without myelopathy or radiculopathy, lumbar region: Secondary | ICD-10-CM | POA: Diagnosis not present

## 2017-07-01 DIAGNOSIS — G894 Chronic pain syndrome: Secondary | ICD-10-CM | POA: Diagnosis not present

## 2017-07-05 DIAGNOSIS — E119 Type 2 diabetes mellitus without complications: Secondary | ICD-10-CM | POA: Diagnosis not present

## 2017-07-05 DIAGNOSIS — J029 Acute pharyngitis, unspecified: Secondary | ICD-10-CM | POA: Diagnosis not present

## 2017-07-05 DIAGNOSIS — E039 Hypothyroidism, unspecified: Secondary | ICD-10-CM | POA: Diagnosis not present

## 2017-07-14 DIAGNOSIS — Z79891 Long term (current) use of opiate analgesic: Secondary | ICD-10-CM | POA: Diagnosis not present

## 2017-07-14 DIAGNOSIS — Z833 Family history of diabetes mellitus: Secondary | ICD-10-CM | POA: Diagnosis not present

## 2017-07-14 DIAGNOSIS — Z7984 Long term (current) use of oral hypoglycemic drugs: Secondary | ICD-10-CM | POA: Diagnosis not present

## 2017-07-14 DIAGNOSIS — Z8349 Family history of other endocrine, nutritional and metabolic diseases: Secondary | ICD-10-CM | POA: Diagnosis not present

## 2017-07-14 DIAGNOSIS — Z809 Family history of malignant neoplasm, unspecified: Secondary | ICD-10-CM | POA: Diagnosis not present

## 2017-07-14 DIAGNOSIS — Z8249 Family history of ischemic heart disease and other diseases of the circulatory system: Secondary | ICD-10-CM | POA: Diagnosis not present

## 2017-07-14 DIAGNOSIS — R69 Illness, unspecified: Secondary | ICD-10-CM | POA: Diagnosis not present

## 2017-07-14 DIAGNOSIS — E119 Type 2 diabetes mellitus without complications: Secondary | ICD-10-CM | POA: Diagnosis not present

## 2017-07-14 DIAGNOSIS — M797 Fibromyalgia: Secondary | ICD-10-CM | POA: Diagnosis not present

## 2017-07-14 DIAGNOSIS — E039 Hypothyroidism, unspecified: Secondary | ICD-10-CM | POA: Diagnosis not present

## 2017-07-14 NOTE — Progress Notes (Signed)
Office Visit Note  Patient: Ashley Perkins             Date of Birth: [birth year]           MRN: [id number]             PCP: Gregor Hams, FNP Referring:  Visit Date: 07/15/2017 Occupation: @GUAROCC @    Subjective:  Increased generalized pain.   History of Present Illness: Ashley Perkins is a 77 y.o. female history of fibromyalgia osteoarthritis and DDD.  She states she had been doing quite well as regards to her osteoarthritis and fibromyalgia.  She has been going to water aerobics which has been very helpful.  She states around January 2019 she started noticing increased pain in her shoulders and hips.  She is having difficulty raising her arms and she is having difficulty getting up from the chair.  She states the pain around her hips is quite severe.  She denies any joint swelling.  Activities of Daily Living:  Patient reports morning stiffness for all day hours.   Patient Reports nocturnal pain.  Difficulty dressing/grooming: Denies Difficulty climbing stairs: Reports Difficulty getting out of chair: Reports Difficulty using hands for taps, buttons, cutlery, and/or writing: Denies   Review of Systems  Constitutional: Positive for fatigue. Negative for night sweats, weight gain and weight loss.  HENT: Positive for mouth sores and mouth dryness. Negative for trouble swallowing, trouble swallowing and nose dryness.   Eyes: Negative for pain, redness, visual disturbance and dryness.  Respiratory: Negative for cough, shortness of breath and difficulty breathing.   Cardiovascular: Negative for chest pain, palpitations, hypertension, irregular heartbeat and swelling in legs/feet.  Gastrointestinal: Negative for blood in stool, constipation and diarrhea.  Endocrine: Negative for increased urination.  Genitourinary: Negative for vaginal dryness.  Musculoskeletal: Positive for arthralgias, joint pain, myalgias, morning stiffness and myalgias. Negative for joint swelling, muscle  weakness and muscle tenderness.  Skin: Negative for color change, rash, hair loss, skin tightness, ulcers and sensitivity to sunlight.  Allergic/Immunologic: Negative for susceptible to infections.  Neurological: Negative for dizziness, memory loss, night sweats and weakness.  Hematological: Negative for swollen glands.  Psychiatric/Behavioral: Positive for sleep disturbance. Negative for depressed mood. The patient is not nervous/anxious.     PMFS History:  Patient Active Problem List   Diagnosis Date Noted  . Fibromyalgia 07/15/2017  . Primary osteoarthritis of both hands 07/15/2017  . Primary osteoarthritis of both knees 07/15/2017  . DDD (degenerative disc disease), cervical 07/15/2017  . DDD (degenerative disc disease), lumbar 07/15/2017  . History of knee surgery 07/15/2017  . Elevated liver function tests   . Generalized abdominal pain   . Depression   . Acute pancreatitis 05/07/2014  . Transaminitis 05/07/2014  . DM2 (diabetes mellitus, type 2) ([place]) 05/07/2014    Past Medical History:  Diagnosis Date  . Diabetes mellitus without complication ([place])   . Fibromyalgia   . Hypercholesteremia     Family History  Problem Relation Age of Onset  . Dementia Mother   . Diabetes Father   . Cancer Father   . Heart Problems Father   . Cancer Brother        prostate  . Heart Problems Brother    Past Surgical History:  Procedure Laterality Date  . BACK SURGERY    . CHOLECYSTECTOMY    . KNEE ARTHROSCOPY  06/07/2017  . LAPAROSCOPIC UNILATERAL SALPINGO OOPHERECTOMY    . TUBAL LIGATION     Social History  Social History Narrative  . Not on file     Objective: Vital Signs: BP 115/73 (BP Location: Left Arm, Patient Position: Sitting, Cuff Size: Normal)   Pulse 81   Resp 15   Ht 5\' 8"  (1.727 m)   Wt 175 lb (79.4 kg)   BMI 26.61 kg/m    Physical Exam  Constitutional: She is oriented to person, place, and time. She appears well-developed and well-nourished.  HENT:    Head: Normocephalic and atraumatic.  Eyes: Conjunctivae and EOM are normal.  Neck: Normal range of motion.  Cardiovascular: Normal rate, regular rhythm, normal heart sounds and intact distal pulses.  Pulmonary/Chest: Effort normal and breath sounds normal.  Abdominal: Soft. Bowel sounds are normal.  Musculoskeletal: She exhibits tenderness.  Lymphadenopathy:    She has no cervical adenopathy.  Neurological: She is alert and oriented to person, place, and time.  Skin: Skin is warm and dry. Capillary refill takes less than 2 seconds.  Psychiatric: She has a normal mood and affect. Her behavior is normal.  Nursing note and vitals reviewed.    Musculoskeletal Exam: C-spine thoracic lumbar spine limited range of motion.  She has painful range of motion of bilateral shoulders with discomfort over her deltoid muscles.  Elbow joints wrist joints are in good range of motion.  She has DIP and PIP thickening in her hands and feet.  She has good range of motion of her hip joints with some discomfort on range of motion.  She is good range of motion of bilateral knee joints with some crepitus.  No warmth swelling or effusion was noted.  She has difficulty getting out of chair.  She also has difficulty rolling in the bed.  CDAI Exam: No CDAI exam completed.    Investigation: No additional findings.   Imaging: Xr Hip Unilat W Or W/o Pelvis 2-3 Views Left  Result Date: 07/15/2017 No hip joint narrowing was noted.  No SI joint changes were noted.  No chondrocalcinosis was noted. Impression: Unremarkable x-ray of the hip joint.  Xr Hip Unilat W Or W/o Pelvis 2-3 Views Right  Result Date: 07/15/2017 Mild inferior medial narrowing was noted.  No chondrocalcinosis was noted.  No SI joint changes were noted. Impression: These findings are consistent with mild osteoarthritis of the hip joint.  Xr Shoulder Left  Result Date: 07/15/2017 No glenohumeral joint space narrowing was noted.  No acromioclavicular  joint space narrowing was noted.  No chondrocalcinosis was noted. Impression unremarkable x-ray of the shoulder joint.  Xr Shoulder Right  Result Date: 07/15/2017 No glenohumeral joint space narrowing was noted.  No acromioclavicular joint space narrowing was noted.  No chondrocalcinosis was noted. Impression unremarkable x-ray of the shoulder joint.   Speciality Comments: No specialty comments available.    Procedures:  No procedures performed Allergies: Other   Assessment / Plan:     Visit Diagnoses: Fibromyalgia-she has long-standing history of fibromyalgia which causes some generalized pain and discomfort.  Although the symptoms have been stable.  Myalgia -she has been experiencing increased muscle pain for the last 4 months.  She states she is having difficulty getting up from the chair raising her arms and also rolling in the bed.  My concern is that she may have polymyalgia rheumatica.  Although inflammatory arthritis is in the differential as well.  Plan: Urinalysis, Routine w reflex microscopic, CK, TSH, Sedimentation rate  Other fatigue - Plan: CBC with Differential/Platelet, COMPLETE METABOLIC PANEL WITH GFR  Chronic pain of both shoulders -  Plan: XR Shoulder Right, XR Shoulder Left, x-ray of bilateral shoulder joints were unremarkable.  Cyclic citrul peptide antibody, IgG, Rheumatoid factor, ANA  Chronic pain of both hips - Plan: XR HIP UNILAT W OR W/O PELVIS 2-3 VIEWS RIGHT, XR HIP UNILAT W OR W/O PELVIS 2-3 VIEWS LEFT.  X-ray of bilateral hip joints were unremarkable.  Primary osteoarthritis of both hands-she has chronic discomfort which is tolerable.  Primary osteoarthritis of both knees she has discomfort off and on.-  DDD (degenerative disc disease), cervical-she is doing quite well.  DDD (degenerative disc disease), lumbar - s/p discectomy-off-and-on discomfort.  History of knee surgery - right, meniscal tear repair   History of diabetes mellitus-according to  patient her diabetes is not very well controlled.  In case we have to start her on prednisone for PMR she will need tight control of her diabetes and may have to go on insulin.  History of pancreatitis  History of hypercholesterolemia  History of hypothyroidism    Orders: Orders Placed This Encounter  Procedures  . XR Shoulder Right  . XR Shoulder Left  . XR HIP UNILAT W OR W/O PELVIS 2-3 VIEWS RIGHT  . XR HIP UNILAT W OR W/O PELVIS 2-3 VIEWS LEFT  . CBC with Differential/Platelet  . COMPLETE METABOLIC PANEL WITH GFR  . Urinalysis, Routine w reflex microscopic  . CK  . TSH  . Sedimentation rate  . Cyclic citrul peptide antibody, IgG  . Rheumatoid factor  . ANA   No orders of the defined types were placed in this encounter.   Face-to-face time spent with patient was 30 minutes.>50% of time was spent in counseling and coordination of care.  Follow-Up Instructions: Return in about 1 month (around 08/12/2017) for FMS, OA.   Bo Merino, MD  Note - This record has been created using Editor, commissioning.  Chart creation errors have been sought, but may not always  have been located. Such creation errors do not reflect on  the standard of medical care.

## 2017-07-15 ENCOUNTER — Ambulatory Visit (INDEPENDENT_AMBULATORY_CARE_PROVIDER_SITE_OTHER): Payer: Self-pay

## 2017-07-15 ENCOUNTER — Encounter: Payer: Self-pay | Admitting: Rheumatology

## 2017-07-15 ENCOUNTER — Ambulatory Visit: Payer: Medicare HMO | Admitting: Rheumatology

## 2017-07-15 ENCOUNTER — Ambulatory Visit (INDEPENDENT_AMBULATORY_CARE_PROVIDER_SITE_OTHER): Payer: Medicare HMO

## 2017-07-15 VITALS — BP 115/73 | HR 81 | Resp 15 | Ht 68.0 in | Wt 175.0 lb

## 2017-07-15 DIAGNOSIS — M25551 Pain in right hip: Secondary | ICD-10-CM

## 2017-07-15 DIAGNOSIS — M25512 Pain in left shoulder: Secondary | ICD-10-CM | POA: Diagnosis not present

## 2017-07-15 DIAGNOSIS — M170 Bilateral primary osteoarthritis of knee: Secondary | ICD-10-CM | POA: Insufficient documentation

## 2017-07-15 DIAGNOSIS — R5383 Other fatigue: Secondary | ICD-10-CM | POA: Diagnosis not present

## 2017-07-15 DIAGNOSIS — G8929 Other chronic pain: Secondary | ICD-10-CM | POA: Diagnosis not present

## 2017-07-15 DIAGNOSIS — M19042 Primary osteoarthritis, left hand: Secondary | ICD-10-CM

## 2017-07-15 DIAGNOSIS — M25552 Pain in left hip: Secondary | ICD-10-CM

## 2017-07-15 DIAGNOSIS — M797 Fibromyalgia: Secondary | ICD-10-CM | POA: Diagnosis not present

## 2017-07-15 DIAGNOSIS — M5030 Other cervical disc degeneration, unspecified cervical region: Secondary | ICD-10-CM

## 2017-07-15 DIAGNOSIS — M25511 Pain in right shoulder: Secondary | ICD-10-CM | POA: Diagnosis not present

## 2017-07-15 DIAGNOSIS — Z8639 Personal history of other endocrine, nutritional and metabolic disease: Secondary | ICD-10-CM | POA: Diagnosis not present

## 2017-07-15 DIAGNOSIS — M791 Myalgia, unspecified site: Secondary | ICD-10-CM | POA: Diagnosis not present

## 2017-07-15 DIAGNOSIS — Z9889 Other specified postprocedural states: Secondary | ICD-10-CM | POA: Diagnosis not present

## 2017-07-15 DIAGNOSIS — M19041 Primary osteoarthritis, right hand: Secondary | ICD-10-CM | POA: Insufficient documentation

## 2017-07-15 DIAGNOSIS — M5136 Other intervertebral disc degeneration, lumbar region: Secondary | ICD-10-CM | POA: Diagnosis not present

## 2017-07-15 DIAGNOSIS — Z8719 Personal history of other diseases of the digestive system: Secondary | ICD-10-CM

## 2017-07-15 DIAGNOSIS — M51369 Other intervertebral disc degeneration, lumbar region without mention of lumbar back pain or lower extremity pain: Secondary | ICD-10-CM

## 2017-07-15 LAB — URINALYSIS, ROUTINE W REFLEX MICROSCOPIC
BILIRUBIN URINE: NEGATIVE
Bacteria, UA: NONE SEEN /HPF
GLUCOSE, UA: NEGATIVE
HGB URINE DIPSTICK: NEGATIVE
HYALINE CAST: NONE SEEN /LPF
Ketones, ur: NEGATIVE
NITRITE: NEGATIVE
Protein, ur: NEGATIVE
RBC / HPF: NONE SEEN /HPF (ref 0–2)
Specific Gravity, Urine: 1.013 (ref 1.001–1.03)
Squamous Epithelial / LPF: NONE SEEN /HPF (ref <=5)

## 2017-07-15 LAB — COMPLETE METABOLIC PANEL WITH GFR
AG Ratio: 1.7 (calc) (ref 1.0–2.5)
ALBUMIN MSPROF: 4.1 g/dL (ref 3.6–5.1)
ALKALINE PHOSPHATASE (APISO): 77 U/L (ref 33–130)
ALT: 9 U/L (ref 6–29)
AST: 16 U/L (ref 10–35)
BILIRUBIN TOTAL: 0.5 mg/dL (ref 0.2–1.2)
BUN: 11 mg/dL (ref 7–25)
CHLORIDE: 99 mmol/L (ref 98–110)
CO2: 30 mmol/L (ref 20–32)
Calcium: 9.2 mg/dL (ref 8.6–10.4)
Creat: 0.72 mg/dL (ref 0.60–0.93)
GFR, Est African American: 94 mL/min/{1.73_m2} (ref >=60)
GFR, Est Non African American: 81 mL/min/{1.73_m2} (ref >=60)
Globulin: 2.4 g/dL (calc) (ref 1.9–3.7)
Glucose, Bld: 148 mg/dL — ABNORMAL HIGH (ref 65–99)
Potassium: 4 mmol/L (ref 3.5–5.3)
Sodium: 137 mmol/L (ref 135–146)
Total Protein: 6.5 g/dL (ref 6.1–8.1)

## 2017-07-15 LAB — CBC WITH DIFFERENTIAL/PLATELET
Basophils Absolute: 20 cells/uL (ref 0–200)
Basophils Relative: 0.3 %
EOS ABS: 87 {cells}/uL (ref 15–500)
EOS PCT: 1.3 %
HCT: 38.4 % (ref 35.0–45.0)
HEMOGLOBIN: 13 g/dL (ref 11.7–15.5)
Lymphs Abs: 1849 cells/uL (ref 850–3900)
MCH: 29.6 pg (ref 27.0–33.0)
MCHC: 33.9 g/dL (ref 32.0–36.0)
MCV: 87.5 fL (ref 80.0–100.0)
MONOS PCT: 6.6 %
MPV: 9.8 fL (ref 7.5–12.5)
NEUTROS ABS: 4301 {cells}/uL (ref 1500–7800)
NEUTROS PCT: 64.2 %
PLATELETS: 292 10*3/uL (ref 140–400)
RBC: 4.39 10*6/uL (ref 3.80–5.10)
RDW: 12.7 % (ref 11.0–15.0)
TOTAL LYMPHOCYTE: 27.6 %
WBC mixed population: 442 cells/uL (ref 200–950)
WBC: 6.7 10*3/uL (ref 3.8–10.8)

## 2017-07-15 LAB — SEDIMENTATION RATE: SED RATE: 18 mm/h (ref 0–30)

## 2017-07-15 LAB — CK: Total CK: 41 U/L (ref 29–143)

## 2017-07-15 LAB — TSH: TSH: 2.74 mIU/L (ref 0.40–4.50)

## 2017-07-16 ENCOUNTER — Telehealth: Payer: Self-pay | Admitting: Rheumatology

## 2017-07-16 ENCOUNTER — Other Ambulatory Visit: Payer: Self-pay

## 2017-07-16 LAB — RHEUMATOID FACTOR

## 2017-07-16 LAB — CYCLIC CITRUL PEPTIDE ANTIBODY, IGG: Cyclic Citrullin Peptide Ab: 44 UNITS — ABNORMAL HIGH

## 2017-07-16 MED ORDER — PREDNISONE 5 MG PO TABS: 5.0000 mg | ORAL_TABLET | Freq: Every day | ORAL | 0 refills | Status: DC

## 2017-07-16 NOTE — Telephone Encounter (Signed)
Patient advised that once all results have come back will mail copy of results.

## 2017-07-16 NOTE — Progress Notes (Signed)
Office Visit Note  Patient: Ashley Perkins             Date of Birth: 1940/12/18           MRN: [id number]             PCP: Gregor Hams, FNP Referring: Tobie Lords D, FNP Visit Date: 07/21/2017 Occupation: @GUAROCC @    Subjective:  Right shoulder pain    History of Present Illness: Ashley Perkins is a 77 y.o. female with history of fibromyalgia and osteoarthritis.  Patient was seen on 07/15/2017 and lab results revealed CCP positive.  At her last visit she was having pain in her bilateral shoulders and bilateral hips.  She is also having muscle weakness in her upper and lower extremity's.  She was placed on prednisone 5 mg.  She is not having any hand pain or feet pain at this time.  She denies any joint swelling.  She will occasionally have discomfort in her left CMC joint.  She states that her hands feel stiff at times but she does not have any swelling.  She is having pain in bilateral knees.  She has valgus deformities of bilateral knees. She is also having pain in her right shoulder who she is seeing Dr. Alden Server for management of.  Her fibromyalgia continues to cause generalized muscle tenderness and muscle aches.  She continues to have insomnia and pain at night.  She is going to a pain management clinic.  She is been taking Tylenol arthritis at night which has been improving her pain.  She continues to take prednisone 5 mg tablets daily.   Activities of Daily Living:  Patient reports morning stiffness for 10-15  minutes.   Patient Reports nocturnal pain.  Difficulty dressing/grooming: Denies Difficulty climbing stairs: Reports Difficulty getting out of chair: Reports Difficulty using hands for taps, buttons, cutlery, and/or writing: Denies   Review of Systems  Constitutional: Positive for fatigue.  HENT: Negative for mouth sores, mouth dryness and nose dryness.   Eyes: Negative for pain, visual disturbance and dryness.  Respiratory: Negative for cough, hemoptysis,  shortness of breath and difficulty breathing.   Cardiovascular: Negative for chest pain, palpitations, hypertension and swelling in legs/feet.  Gastrointestinal: Negative for blood in stool, constipation and diarrhea.  Endocrine: Negative for increased urination.  Genitourinary: Negative for painful urination.  Musculoskeletal: Positive for arthralgias, joint pain, morning stiffness and muscle tenderness. Negative for joint swelling, myalgias, muscle weakness and myalgias.  Skin: Negative for color change, pallor, rash, hair loss, nodules/bumps, skin tightness, ulcers and sensitivity to sunlight.  Allergic/Immunologic: Negative for susceptible to infections.  Neurological: Negative for dizziness, numbness, headaches and weakness.  Hematological: Negative for swollen glands.  Psychiatric/Behavioral: Positive for sleep disturbance. Negative for depressed mood. The patient is not nervous/anxious.     PMFS History:  Patient Active Problem List   Diagnosis Date Noted  . Fibromyalgia 07/15/2017  . Primary osteoarthritis of both hands 07/15/2017  . Primary osteoarthritis of both knees 07/15/2017  . DDD (degenerative disc disease), cervical 07/15/2017  . DDD (degenerative disc disease), lumbar 07/15/2017  . History of knee surgery 07/15/2017  . Elevated liver function tests   . Generalized abdominal pain   . Depression   . Acute pancreatitis 05/07/2014  . Transaminitis 05/07/2014  . DM2 (diabetes mellitus, type 2) ([place]) 05/07/2014    Past Medical History:  Diagnosis Date  . Diabetes mellitus without complication ([place])   . Fibromyalgia   . Hypercholesteremia  Family History  Problem Relation Age of Onset  . Dementia Mother   . Diabetes Father   . Cancer Father   . Heart Problems Father   . Cancer Brother        prostate  . Heart Problems Brother    Past Surgical History:  Procedure Laterality Date  . BACK SURGERY    . CHOLECYSTECTOMY    . KNEE ARTHROSCOPY  06/07/2017  .  LAPAROSCOPIC UNILATERAL SALPINGO OOPHERECTOMY    . TUBAL LIGATION     Social History   Social History Narrative  . Not on file     Objective: Vital Signs: BP 124/79 (BP Location: Left Arm, Patient Position: Sitting, Cuff Size: Normal)   Pulse 95   Resp 14   Ht 5\' 9"  (1.753 m)   Wt 174 lb (78.9 kg)   BMI 25.70 kg/m    Physical Exam  Constitutional: She is oriented to person, place, and time. She appears well-developed and well-nourished.  HENT:  Head: Normocephalic and atraumatic.  Eyes: Conjunctivae and EOM are normal.  Neck: Normal range of motion.  Cardiovascular: Normal rate, regular rhythm, normal heart sounds and intact distal pulses.  Pulmonary/Chest: Effort normal and breath sounds normal.  Abdominal: Soft. Bowel sounds are normal.  Lymphadenopathy:    She has no cervical adenopathy.  Neurological: She is alert and oriented to person, place, and time.  Skin: Skin is warm and dry. Capillary refill takes less than 2 seconds.  Psychiatric: She has a normal mood and affect. Her behavior is normal.  Nursing note and vitals reviewed.    Musculoskeletal Exam: C-spine, thoracic spine, lumbar spine good range of motion.  No midline spinal tenderness.  No SI joint tenderness.  Shoulder joints, elbow joints, wrist joints, MCPs, PIPs, DIPs good range of motion with no synovitis.  Hip joints, knee joints, ankle joints, MTPs, PIPs, DIPs good range of motion with no synovitis.  No warmth or effusion of bilateral knees.  She has bilateral knee crepitus.  No tenderness of trochanteric bursa.  CDAI Exam: No CDAI exam completed.    Investigation: No additional findings. CBC Latest Ref Rng & Units 07/15/2017 05/08/2014 05/06/2014  WBC 3.8 - 10.8 Thousand/uL 6.7 8.7 10.4  Hemoglobin 11.7 - 15.5 g/dL 13.0 13.2 13.8  Hematocrit 35.0 - 45.0 % 38.4 39.7 41.0  Platelets 140 - 400 Thousand/uL 292 228 244   CMP Latest Ref Rng & Units 07/15/2017 05/09/2014 05/08/2014  Glucose 65 - 99 mg/dL  148(H) 179(H) 157(H)  BUN 7 - 25 mg/dL 11 9 <5(L)  Creatinine 0.60 - 0.93 mg/dL 0.72 0.97 0.80  Sodium 135 - 146 mmol/L 137 138 139  Potassium 3.5 - 5.3 mmol/L 4.0 3.9 4.3  Chloride 98 - 110 mmol/L 99 102 105  CO2 20 - 32 mmol/L 30 31 31   Calcium 8.6 - 10.4 mg/dL 9.2 9.1 9.3  Total Protein 6.1 - 8.1 g/dL 6.5 6.0 6.2  Total Bilirubin 0.2 - 1.2 mg/dL 0.5 0.9 0.6  Alkaline Phos 39 - 117 U/L - 150(H) 173(H)  AST 10 - 35 U/L 16 42(H) 139(H)  ALT 6 - 29 U/L 9 88(H) 156(H)    Imaging: Xr Hip Unilat W Or W/o Pelvis 2-3 Views Left  Result Date: 07/15/2017 No hip joint narrowing was noted.  No SI joint changes were noted.  No chondrocalcinosis was noted. Impression: Unremarkable x-ray of the hip joint.  Xr Hip Unilat W Or W/o Pelvis 2-3 Views Right  Result Date: 07/15/2017 Mild inferior  medial narrowing was noted.  No chondrocalcinosis was noted.  No SI joint changes were noted. Impression: These findings are consistent with mild osteoarthritis of the hip joint.  Xr Shoulder Left  Result Date: 07/15/2017 No glenohumeral joint space narrowing was noted.  No acromioclavicular joint space narrowing was noted.  No chondrocalcinosis was noted. Impression unremarkable x-ray of the shoulder joint.  Xr Shoulder Right  Result Date: 07/15/2017 No glenohumeral joint space narrowing was noted.  No acromioclavicular joint space narrowing was noted.  No chondrocalcinosis was noted. Impression unremarkable x-ray of the shoulder joint.   Speciality Comments: No specialty comments available.    Procedures:  No procedures performed Allergies: Other   Assessment / Plan:     Visit Diagnoses: Fibromyalgia: She continues to have generalized muscle aches and muscle tenderness.  She has chronic fatigue and insomnia.  She takes amitriptyline 75 mg at bedtime.  Good sleep hygiene was discussed.  She encouraged to continue to go to water aerobics on a regular basis.  Rheumatoid arthritis of multiple sites with  negative rheumatoid factor: CCP+, RF-: She has no active synovitis at this time.  She is currently taking prednisone 5 mg 1 tablet daily.  She is going to be started on Plaquenil 200 mg twice daily.  We discussed the indications, contraindications, and side effects of starting Plaquenil.  All questions were addressed.  Consent was obtained today.  A prescription for Plaquenil will be sent to the pharmacy.  She was given a Plaquenil eye exam form is going to schedule an appointment with her ophthalmologist.  We will see her back in 2 months.  She will continue taking prednisone 5 mg for 1 month and taper to 2-1/2 mg tablets for 1 month.  Primary osteoarthritis of both hands: She has PIP and DIP synovial thickening consistent with osteoarthritis of bilateral hands.  Joint protection muscle strengthening were discussed.  Primary osteoarthritis of both knees: She has bilateral knee crepitus.  No warmth or effusion noted.  She has occasional discomfort in bilateral knees.  She has valgus deformity bilaterally.  DDD (degenerative disc disease), cervical: Good range of motion with no discomfort at this time.  DDD (degenerative disc disease), lumbar - s/p discectomy: No midline spinal tenderness.  Good range of motion of lumbar spine.  She has no discomfort in her lower back at this time.  Other fatigue: Chronic and related to insomnia.  History of knee surgery - right, meniscal tear repair: She experiences discomfort in her right knee at times.  She has no warmth or effusion.  She has no mechanical symptoms at this time.  She has right knee crepitus.  Medical conditions are listed as follows:  History of hypothyroidism  History of diabetes mellitus: She is advised to continue to monitor blood glucose level closely since she is on prednisone 5 mg tablet once daily.  History of hypercholesterolemia  History of pancreatitis    Orders: No orders of the defined types were placed in this  encounter.  Meds ordered this encounter  Medications  . hydroxychloroquine (PLAQUENIL) 200 MG tablet    Sig: Take 1 tablet (200 mg total) by mouth 2 (two) times daily.    Dispense:  60 tablet    Refill:  1    Face-to-face time spent with patient was 30 minutes. > 50% of time was spent in counseling and coordination of care.  Follow-Up Instructions: Return in about 6 months (around 01/21/2018) for Fibromyalgia, Osteoarthritis, DDD.   Hazel Sams PA-C  I examined and evaluated the patient with Hazel Sams PA.  Patient is clinically improved without any muscle weakness or tenderness or joint pain and discomfort.  On her recent labs her anti-CCP antibody was positive.  She most likely have seronegative rheumatoid arthritis.  The plan of care was discussed as noted above.  Bo Merino, MD  Note - This record has been created using Editor, commissioning.  Chart creation errors have been sought, but may not always  have been located. Such creation errors do not reflect on  the standard of medical care.

## 2017-07-16 NOTE — Progress Notes (Addendum)
Patient advised of lab results and has been advised to take 5mg  of prednisone daily, per Dr. Estanislado Pandy. Patient has been advised to monitor glucose levels and notify PCP that she is on prednisone. Patient will be returning to the office on 07/21/2017 for a return visit.

## 2017-07-16 NOTE — Telephone Encounter (Signed)
Patient calling requesting a copy of her lab results mailed to her home.

## 2017-07-19 LAB — ANTI-NUCLEAR AB-TITER (ANA TITER): ANA Titer 1: 1:320 {titer} — ABNORMAL HIGH

## 2017-07-19 LAB — ANA: Anti Nuclear Antibody(ANA): POSITIVE — AB

## 2017-07-20 NOTE — Progress Notes (Signed)
Please sch an appt ASAP

## 2017-07-20 NOTE — Progress Notes (Signed)
Pl add G6PD if possible.

## 2017-07-21 ENCOUNTER — Ambulatory Visit: Payer: Medicare HMO | Admitting: Rheumatology

## 2017-07-21 ENCOUNTER — Encounter: Payer: Self-pay | Admitting: Rheumatology

## 2017-07-21 VITALS — BP 124/79 | HR 95 | Resp 14 | Ht 69.0 in | Wt 174.0 lb

## 2017-07-21 DIAGNOSIS — M170 Bilateral primary osteoarthritis of knee: Secondary | ICD-10-CM

## 2017-07-21 DIAGNOSIS — M797 Fibromyalgia: Secondary | ICD-10-CM

## 2017-07-21 DIAGNOSIS — M5030 Other cervical disc degeneration, unspecified cervical region: Secondary | ICD-10-CM

## 2017-07-21 DIAGNOSIS — Z8719 Personal history of other diseases of the digestive system: Secondary | ICD-10-CM | POA: Diagnosis not present

## 2017-07-21 DIAGNOSIS — M0609 Rheumatoid arthritis without rheumatoid factor, multiple sites: Secondary | ICD-10-CM | POA: Diagnosis not present

## 2017-07-21 DIAGNOSIS — Z9889 Other specified postprocedural states: Secondary | ICD-10-CM | POA: Diagnosis not present

## 2017-07-21 DIAGNOSIS — M19042 Primary osteoarthritis, left hand: Secondary | ICD-10-CM

## 2017-07-21 DIAGNOSIS — R5383 Other fatigue: Secondary | ICD-10-CM

## 2017-07-21 DIAGNOSIS — Z8639 Personal history of other endocrine, nutritional and metabolic disease: Secondary | ICD-10-CM

## 2017-07-21 DIAGNOSIS — M5136 Other intervertebral disc degeneration, lumbar region: Secondary | ICD-10-CM

## 2017-07-21 DIAGNOSIS — M19041 Primary osteoarthritis, right hand: Secondary | ICD-10-CM | POA: Diagnosis not present

## 2017-07-21 MED ORDER — HYDROXYCHLOROQUINE SULFATE 200 MG PO TABS: 200.0000 mg | ORAL_TABLET | Freq: Two times a day (BID) | ORAL | 1 refills | Status: DC

## 2017-07-21 NOTE — Patient Instructions (Signed)

## 2017-07-23 DIAGNOSIS — R69 Illness, unspecified: Secondary | ICD-10-CM | POA: Diagnosis not present

## 2017-07-28 ENCOUNTER — Telehealth: Payer: Self-pay | Admitting: Rheumatology

## 2017-07-28 ENCOUNTER — Telehealth: Payer: Self-pay | Admitting: Physician Assistant

## 2017-07-28 DIAGNOSIS — M7541 Impingement syndrome of right shoulder: Secondary | ICD-10-CM | POA: Diagnosis not present

## 2017-07-28 NOTE — Telephone Encounter (Signed)
Patient called stating she has tried to make an appointment with her eye doctor, Dr. Phineas Inches, [facility] for the Plaquenil eye exam but she was told they don't have any appointments with any of their doctor's until August.  Patient is asking if Dr. Estanislado Pandy can recommend an eye doctor in [place].  Patient also states that the Plaquenil has given her diarrhea and is asking if there is anything she can take.

## 2017-07-28 NOTE — Telephone Encounter (Signed)
I called patient to address the diarrhea she is experiencing as a side effect of starting plaquenil on 07/21/17.  She has been taking Plaquenil 200 mg twice daily. I advised her to try taking PLQ 200 mg 1 tablet daily. She was advised to notify us if she continues to have diarrhea.  I also reminded her of trying to taper off of prednisone.  She is currently on prednisone 5 mg which she will continue for 1 month and then taper to half tablet by mouth daily for 1 month and then discontinue prednisone.  She agrees with the current plan. All questions were addressed.

## 2017-07-28 NOTE — Telephone Encounter (Signed)
Patient has been recommended to Dr. Katy Fitch. Patient states she has been on PLQ since 07-21-17. Patient is having diarrhea. Patient is taking 200 mg BID. Please advise.

## 2017-07-29 ENCOUNTER — Telehealth: Payer: Self-pay | Admitting: Rheumatology

## 2017-07-29 DIAGNOSIS — Y929 Unspecified place or not applicable: Secondary | ICD-10-CM | POA: Diagnosis not present

## 2017-07-29 DIAGNOSIS — R110 Nausea: Secondary | ICD-10-CM | POA: Diagnosis not present

## 2017-07-29 DIAGNOSIS — R739 Hyperglycemia, unspecified: Secondary | ICD-10-CM | POA: Diagnosis not present

## 2017-07-29 DIAGNOSIS — R112 Nausea with vomiting, unspecified: Secondary | ICD-10-CM | POA: Diagnosis not present

## 2017-07-29 DIAGNOSIS — T380X5A Adverse effect of glucocorticoids and synthetic analogues, initial encounter: Secondary | ICD-10-CM | POA: Diagnosis not present

## 2017-07-29 DIAGNOSIS — Z7984 Long term (current) use of oral hypoglycemic drugs: Secondary | ICD-10-CM | POA: Diagnosis not present

## 2017-07-29 DIAGNOSIS — E0965 Drug or chemical induced diabetes mellitus with hyperglycemia: Secondary | ICD-10-CM | POA: Diagnosis not present

## 2017-07-29 DIAGNOSIS — Z79899 Other long term (current) drug therapy: Secondary | ICD-10-CM | POA: Diagnosis not present

## 2017-07-29 NOTE — Telephone Encounter (Signed)
Patient left a voicemail stating that she would like to "back off the Prednisone starting tomorrow."  Patient states she has been taking it for 19 days and doesn't want to continue.  Patient states she was told to taper to 1/2 tablet daily and wants to confirm that she should do that for a month before stopping completely.

## 2017-07-30 DIAGNOSIS — M797 Fibromyalgia: Secondary | ICD-10-CM | POA: Diagnosis not present

## 2017-07-30 DIAGNOSIS — R768 Other specified abnormal immunological findings in serum: Secondary | ICD-10-CM | POA: Diagnosis not present

## 2017-07-30 DIAGNOSIS — G894 Chronic pain syndrome: Secondary | ICD-10-CM | POA: Diagnosis not present

## 2017-07-30 DIAGNOSIS — E119 Type 2 diabetes mellitus without complications: Secondary | ICD-10-CM | POA: Diagnosis not present

## 2017-07-30 NOTE — Telephone Encounter (Signed)
Patient advised per last office note she is to continue the Prednisone 5 mg until June 8 and then decrease to 2.5 mg for 1 month. Patient verbalized understanding.

## 2017-08-03 DIAGNOSIS — Z961 Presence of intraocular lens: Secondary | ICD-10-CM | POA: Diagnosis not present

## 2017-08-03 DIAGNOSIS — E119 Type 2 diabetes mellitus without complications: Secondary | ICD-10-CM | POA: Diagnosis not present

## 2017-08-03 DIAGNOSIS — M0589 Other rheumatoid arthritis with rheumatoid factor of multiple sites: Secondary | ICD-10-CM | POA: Diagnosis not present

## 2017-08-03 DIAGNOSIS — H1851 Endothelial corneal dystrophy: Secondary | ICD-10-CM | POA: Diagnosis not present

## 2017-08-03 DIAGNOSIS — Z79899 Other long term (current) drug therapy: Secondary | ICD-10-CM | POA: Diagnosis not present

## 2017-08-11 ENCOUNTER — Telehealth: Payer: Self-pay | Admitting: Rheumatology

## 2017-08-11 NOTE — Telephone Encounter (Signed)
Patient left a voicemail requesting a prescription refill of Prednisone 5 mg to be sent to [facility] on [address] in [place].

## 2017-08-12 ENCOUNTER — Ambulatory Visit: Payer: Self-pay | Admitting: Physician Assistant

## 2017-08-12 MED ORDER — PREDNISONE 5 MG PO TABS: 2.5000 mg | ORAL_TABLET | Freq: Every day | ORAL | 0 refills | Status: DC

## 2017-08-12 NOTE — Telephone Encounter (Signed)
Last Visit: 07/21/17 Next Visit: 09/22/17  Okay to refill per Dr. Estanislado Pandy

## 2017-08-15 DIAGNOSIS — L270 Generalized skin eruption due to drugs and medicaments taken internally: Secondary | ICD-10-CM | POA: Diagnosis not present

## 2017-08-16 ENCOUNTER — Encounter: Payer: Self-pay | Admitting: Rheumatology

## 2017-08-16 ENCOUNTER — Ambulatory Visit: Payer: Medicare HMO | Admitting: Rheumatology

## 2017-08-16 VITALS — BP 113/64 | HR 85 | Resp 14 | Ht 68.5 in | Wt 180.0 lb

## 2017-08-16 DIAGNOSIS — M170 Bilateral primary osteoarthritis of knee: Secondary | ICD-10-CM

## 2017-08-16 DIAGNOSIS — Z9889 Other specified postprocedural states: Secondary | ICD-10-CM | POA: Diagnosis not present

## 2017-08-16 DIAGNOSIS — Z79899 Other long term (current) drug therapy: Secondary | ICD-10-CM

## 2017-08-16 DIAGNOSIS — M797 Fibromyalgia: Secondary | ICD-10-CM | POA: Diagnosis not present

## 2017-08-16 DIAGNOSIS — M5136 Other intervertebral disc degeneration, lumbar region: Secondary | ICD-10-CM | POA: Diagnosis not present

## 2017-08-16 DIAGNOSIS — M0609 Rheumatoid arthritis without rheumatoid factor, multiple sites: Secondary | ICD-10-CM | POA: Diagnosis not present

## 2017-08-16 DIAGNOSIS — Z8639 Personal history of other endocrine, nutritional and metabolic disease: Secondary | ICD-10-CM | POA: Diagnosis not present

## 2017-08-16 DIAGNOSIS — R5383 Other fatigue: Secondary | ICD-10-CM

## 2017-08-16 DIAGNOSIS — M51369 Other intervertebral disc degeneration, lumbar region without mention of lumbar back pain or lower extremity pain: Secondary | ICD-10-CM

## 2017-08-16 DIAGNOSIS — M5030 Other cervical disc degeneration, unspecified cervical region: Secondary | ICD-10-CM

## 2017-08-16 DIAGNOSIS — Z8719 Personal history of other diseases of the digestive system: Secondary | ICD-10-CM | POA: Diagnosis not present

## 2017-08-16 DIAGNOSIS — M19042 Primary osteoarthritis, left hand: Secondary | ICD-10-CM

## 2017-08-16 DIAGNOSIS — M19041 Primary osteoarthritis, right hand: Secondary | ICD-10-CM | POA: Diagnosis not present

## 2017-08-16 MED ORDER — PREDNISONE 5 MG PO TABS: ORAL_TABLET | ORAL | 0 refills | Status: DC

## 2017-08-16 NOTE — Progress Notes (Signed)
Office Visit Note  Patient: Ashley Perkins             Date of Birth: 06/19/1940           MRN: [id number]             PCP: Gregor Hams, FNP Referring: Tobie Lords D, FNP Visit Date: 08/16/2017 Occupation: @GUAROCC @    Subjective:  Rash  History of Present Illness: Ashley Perkins is a 77 y.o. female with history of rheumatoid arthritis, fibromyalgia, osteoarthritis, and DDD.  Patient has been taking Plaquenil 200 mg 1 tablet by mouth daily.  She was started on PLQ 200 mg BID on 07/21/17, but she had to decrease her dose to 1 tablet daily due to experiencing diarrhea.  The diarrhea resolved after lowering her dose.  She is currently on Prednisone 2.5 mg.  She noticed mildly increased joint stiffness after decreasing her dose of prednisone from 5 mg to 2.5 mg daily.  She reports that on Saturday she started to develop a rash on her arms and face, which continued to progress and is now affecting all extremities, torso, and face.   She discontinued PLQ 2 days ago. She was seen in urgent care yesterday and received a dextamethasone 10 mg IM injection.  She feels as though the rash continues to worsen.  She has been unable to take benadryl due to already taking amitriptyline.  She has not applied anything topically.  She denies any itching but states the facial rash is burning.  She denies any exposure to sunlight.  She denies starting any other medications or any new products at home.   She reports that while on PLQ her joint pain resolved.  She denies any joint pain or swelling at this time.  She states she is not experiencing any neck or lower back pain.  She states her fibromyalgia has been stable.  She denies any muscle aches or muscle tenderness.  She has not had any recent fibro flares.      Activities of Daily Living:  Patient reports morning stiffness for 0  minutes.   Patient Denies nocturnal pain.  Difficulty dressing/grooming: Denies Difficulty climbing stairs:  Denies Difficulty getting out of chair: Denies Difficulty using hands for taps, buttons, cutlery, and/or writing: Denies   Review of Systems  Constitutional: Negative for fatigue.  HENT: Negative for mouth sores, mouth dryness and nose dryness.   Eyes: Negative for pain, visual disturbance and dryness.  Respiratory: Negative for cough, hemoptysis, shortness of breath and difficulty breathing.   Cardiovascular: Positive for palpitations. Negative for chest pain, hypertension and swelling in legs/feet.  Gastrointestinal: Negative for blood in stool, constipation and diarrhea.  Endocrine: Negative for increased urination.  Genitourinary: Negative for painful urination.  Musculoskeletal: Negative for arthralgias, joint pain, joint swelling, myalgias, muscle weakness, morning stiffness, muscle tenderness and myalgias.  Skin: Positive for rash. Negative for color change, pallor, hair loss, nodules/bumps, skin tightness, ulcers and sensitivity to sunlight.  Allergic/Immunologic: Negative for susceptible to infections.  Neurological: Negative for dizziness, numbness, headaches and weakness.  Hematological: Negative for swollen glands.  Psychiatric/Behavioral: Negative for depressed mood and sleep disturbance. The patient is nervous/anxious.     PMFS History:  Patient Active Problem List   Diagnosis Date Noted  . Fibromyalgia 07/15/2017  . Primary osteoarthritis of both hands 07/15/2017  . Primary osteoarthritis of both knees 07/15/2017  . DDD (degenerative disc disease), cervical 07/15/2017  . DDD (degenerative disc disease), lumbar 07/15/2017  .  History of knee surgery 07/15/2017  . Elevated liver function tests   . Generalized abdominal pain   . Depression   . Acute pancreatitis 05/07/2014  . Transaminitis 05/07/2014  . DM2 (diabetes mellitus, type 2) ([place]) 05/07/2014    Past Medical History:  Diagnosis Date  . Diabetes mellitus without complication ([place])   . Fibromyalgia   .  Hypercholesteremia     Family History  Problem Relation Age of Onset  . Dementia Mother   . Diabetes Father   . Cancer Father   . Heart Problems Father   . Cancer Brother        prostate  . Heart Problems Brother    Past Surgical History:  Procedure Laterality Date  . BACK SURGERY    . CHOLECYSTECTOMY    . KNEE ARTHROSCOPY  06/07/2017  . LAPAROSCOPIC UNILATERAL SALPINGO OOPHERECTOMY    . TUBAL LIGATION     Social History   Social History Narrative  . Not on file     Objective: Vital Signs: BP 113/64 (BP Location: Left Arm, Patient Position: Sitting, Cuff Size: Normal)   Pulse 85   Resp 14   Ht 5' 8.5" (1.74 m)   Wt 180 lb (81.6 kg)   BMI 26.97 kg/m    Physical Exam  Constitutional: She is oriented to person, place, and time. She appears well-developed and well-nourished.  HENT:  Head: Normocephalic and atraumatic.  Eyes: Conjunctivae and EOM are normal.  Neck: Normal range of motion.  Cardiovascular: Normal rate, regular rhythm, normal heart sounds and intact distal pulses.  Pulmonary/Chest: Effort normal and breath sounds normal.  Abdominal: Soft. Bowel sounds are normal.  Lymphadenopathy:    She has no cervical adenopathy.  Neurological: She is alert and oriented to person, place, and time.  Skin: Skin is warm and dry. Capillary refill takes less than 2 seconds.  Maculopapular rash on face, scalp, torso, and all extremities.   Psychiatric: She has a normal mood and affect. Her behavior is normal.  Nursing note and vitals reviewed.    Musculoskeletal Exam: C-spine, thoracic spine, lumbar spine good range of motion.  No midline spinal tenderness.  No SI joint tenderness.  Shoulder joints, elbow joints, wrist joints, MCPs, PIPs, DIPs good range of motion with no synovitis.  Hip joints, knee joints, ankle joints, MTPs, PIPs, DIPs good range of motion with no synovitis.  No warmth or effusion of bilateral knee joints.  CDAI Exam: CDAI Homunculus Exam:   Joint  Counts:  CDAI Tender Joint count: 0 CDAI Swollen Joint count: 0  Global Assessments:  Patient Global Assessment: 1 Provider Global Assessment: 1  CDAI Calculated Score: 2    Investigation: No additional findings. CBC Latest Ref Rng & Units 07/15/2017 05/08/2014 05/06/2014  WBC 3.8 - 10.8 Thousand/uL 6.7 8.7 10.4  Hemoglobin 11.7 - 15.5 g/dL 13.0 13.2 13.8  Hematocrit 35.0 - 45.0 % 38.4 39.7 41.0  Platelets 140 - 400 Thousand/uL 292 228 244   CMP Latest Ref Rng & Units 07/15/2017 05/09/2014 05/08/2014  Glucose 65 - 99 mg/dL 148(H) 179(H) 157(H)  BUN 7 - 25 mg/dL 11 9 <5(L)  Creatinine 0.60 - 0.93 mg/dL 0.72 0.97 0.80  Sodium 135 - 146 mmol/L 137 138 139  Potassium 3.5 - 5.3 mmol/L 4.0 3.9 4.3  Chloride 98 - 110 mmol/L 99 102 105  CO2 20 - 32 mmol/L 30 31 31   Calcium 8.6 - 10.4 mg/dL 9.2 9.1 9.3  Total Protein 6.1 - 8.1 g/dL 6.5 6.0  6.2  Total Bilirubin 0.2 - 1.2 mg/dL 0.5 0.9 0.6  Alkaline Phos 39 - 117 U/L - 150(H) 173(H)  AST 10 - 35 U/L 16 42(H) 139(H)  ALT 6 - 29 U/L 9 88(H) 156(H)    Imaging: No results found.  Speciality Comments: No specialty comments available.    Procedures:  No procedures performed Allergies: Other   Claritin Prednisone [facility]   Assessment / Plan:     Visit Diagnoses: Rheumatoid arthritis of multiple sites with negative rheumatoid factor (HCC) - CCP+, RF-, on prednisone therapy.  She has no synovitis on exam today.  She was started on Plaquenil on 07/21/2017.  She was taking Plaquenil 200 mg twice daily but started having significant diarrhea and dropped down to 1 tablet daily.  On Saturday she noticed a rash developing on her arms and face which has continued to progress.  She was seen in urgent care yesterday and got a dexamethasone IM injection.  She has maculopapular rash on her face, scalp, torso, and all extremities.  She has not applied anything topically.  She has not taken Benadryl due to drug interaction with amitriptyline.   She describes a burning sensation but no itching.  She denies any other new medications or new products at home.  She reports that while she was on Plaquenil all of her joint pain and swelling resolved.  She is currently still on prednisone 2.5 mg daily.  We will start her on a prednisone taper starting at 20 mg and tapering by 5 mg every 4 days.  She is advised to monitor her blood pressure and blood glucose while she is on the prednisone taper.  She is also advised to start taking Claritin since she cannot take Benadryl.  She is also advised to follow-up with [facility] dermatology ASAP for evaluation.  Once her rash resolves we will consider starting her methotrexate.  The necessary labs were obtained today.  She was also given a handout to read about methotrexate.  High risk medication use -she discontinued PLQ 2 days ago due to developing a maculopapular rash.  Fibromyalgia: Stable.  She has not had any recent fibromyalgia flares.  She has no muscle aches or muscle tenderness at this time.  Primary osteoarthritis of both hands: Mild PIP and DIP synovial thickening consistent with osteoarthritis of bilateral hands.  Joint protection and muscle strengthening were discussed.  Primary osteoarthritis of both knees - She has bilateral knee crepitus.  No warmth or effusion noticed.  She has no discomfort in her knees at this time.  DDD (degenerative disc disease), cervical: No discomfort at this time.  DDD (degenerative disc disease), lumbar - s/p discectomy: No discomfort.  Other fatigue: Improved.  Other medical conditions are listed as follows:  History of pancreatitis  History of knee surgery - right, meniscal tear repair  History of hypothyroidism  History of diabetes mellitus  History of hypercholesterolemia    Orders: Orders Placed This Encounter  Procedures  . HIV antibody  . QuantiFERON-TB Gold Plus  . Serum protein electrophoresis with reflex  . IgG, IgA, IgM  . Hepatitis  B core antibody, IgM  . Hepatitis C antibody  . Hepatitis B surface antigen  . COMPLETE METABOLIC PANEL WITH GFR  . CBC with Differential/Platelet   Meds ordered this encounter  Medications  . predniSONE (DELTASONE) 5 MG tablet    Sig: Take 4 tablets by mouth for 4 days, 3 tablets for 4 days, 2 tablets for 4 days, 1 tablet  for 4 days    Dispense:  40 tablet    Refill:  0    Face-to-face time spent with patient was 30 minutes. >50% of time was spent in counseling and coordination of care.  Follow-Up Instructions: Return in about 5 months (around 01/16/2018) for Rheumatoid arthritis, Fibromyalgia, Osteoarthritis.   Hazel Sams PA-C I examined and evaluated the patient with Hazel Sams PA.  Patient was seen on urgent basis today.  She had been on Plaquenil for a month.  She has been on low-dose Plaquenil.  She had extensive maculopapular rash involving her trunk and extremities.  She was given a prednisone taper.  To consider methotrexate at follow-up visit.  She will also see her dermatologist today.  The plan of care was discussed as noted above.  Bo Merino, MDNote - This record has been created using [employer].  Chart creation errors have been sought, but may not always  have been located. Such creation errors do not reflect on  the standard of medical care.

## 2017-08-16 NOTE — Patient Instructions (Signed)
Methotrexate tablets What is this medicine? METHOTREXATE (METH oh TREX ate) is a chemotherapy drug used to treat cancer including breast cancer, leukemia, and lymphoma. This medicine can also be used to treat psoriasis and certain kinds of arthritis. This medicine may be used for other purposes; ask your health care provider or pharmacist if you have questions. COMMON BRAND NAME(S): Rheumatrex, Trexall What should I tell my health care provider before I take this medicine? They need to know if you have any of these conditions: -fluid in the stomach area or lungs -if you often drink alcohol -infection or immune system problems -kidney disease or on hemodialysis -liver disease -low blood counts, like low white cell, platelet, or red cell counts -lung disease -radiation therapy -stomach ulcers -ulcerative colitis -an unusual or allergic reaction to methotrexate, other medicines, foods, dyes, or preservatives -pregnant or trying to get pregnant -breast-feeding How should I use this medicine? Take this medicine by mouth with a glass of water. Follow the directions on the prescription label. Take your medicine at regular intervals. Do not take it more often than directed. Do not stop taking except on your doctor's advice. Make sure you know why you are taking this medicine and how often you should take it. If this medicine is used for a condition that is not cancer, like arthritis or psoriasis, it should be taken weekly, NOT daily. Taking this medicine more often than directed can cause serious side effects, even death. Talk to your healthcare provider about safe handling and disposal of this medicine. You may need to take special precautions. Talk to your pediatrician regarding the use of this medicine in children. While this drug may be prescribed for selected conditions, precautions do apply. Overdosage: If you think you have taken too much of this medicine contact a poison control center or  emergency room at once. NOTE: This medicine is only for you. Do not share this medicine with others. What if I miss a dose? If you miss a dose, talk with your doctor or health care professional. Do not take double or extra doses. What may interact with this medicine? This medicine may interact with the following medication: -acitretin -aspirin and aspirin-like medicines including salicylates -azathioprine -certain antibiotics like penicillins, tetracycline, and chloramphenicol -cyclosporine -gold -hydroxychloroquine -live virus vaccines -NSAIDs, medicines for pain and inflammation, like ibuprofen or naproxen -other cytotoxic agents -penicillamine -phenylbutazone -phenytoin -probenecid -retinoids such as isotretinoin and tretinoin -steroid medicines like prednisone or cortisone -sulfonamides like sulfasalazine and trimethoprim/sulfamethoxazole -theophylline This list may not describe all possible interactions. Give your health care provider a list of all the medicines, herbs, non-prescription drugs, or dietary supplements you use. Also tell them if you smoke, drink alcohol, or use illegal drugs. Some items may interact with your medicine. What should I watch for while using this medicine? Avoid alcoholic drinks. This medicine can make you more sensitive to the sun. Keep out of the sun. If you cannot avoid being in the sun, wear protective clothing and use sunscreen. Do not use sun lamps or tanning beds/booths. You may need blood work done while you are taking this medicine. Call your doctor or health care professional for advice if you get a fever, chills or sore throat, or other symptoms of a cold or flu. Do not treat yourself. This drug decreases your body's ability to fight infections. Try to avoid being around people who are sick. This medicine may increase your risk to bruise or bleed. Call your doctor or health care professional   if you notice any unusual bleeding. Check with your  doctor or health care professional if you get an attack of severe diarrhea, nausea and vomiting, or if you sweat a lot. The loss of too much body fluid can make it dangerous for you to take this medicine. Talk to your doctor about your risk of cancer. You may be more at risk for certain types of cancers if you take this medicine. Both men and women must use effective birth control with this medicine. Do not become pregnant while taking this medicine or until at least 1 normal menstrual cycle has occurred after stopping it. Women should inform their doctor if they wish to become pregnant or think they might be pregnant. Men should not father a child while taking this medicine and for 3 months after stopping it. There is a potential for serious side effects to an unborn child. Talk to your health care professional or pharmacist for more information. Do not breast-feed an infant while taking this medicine. What side effects may I notice from receiving this medicine? Side effects that you should report to your doctor or health care professional as soon as possible: -allergic reactions like skin rash, itching or hives, swelling of the face, lips, or tongue -breathing problems or shortness of breath -diarrhea -dry, nonproductive cough -low blood counts - this medicine may decrease the number of white blood cells, red blood cells and platelets. You may be at increased risk for infections and bleeding. -mouth sores -redness, blistering, peeling or loosening of the skin, including inside the mouth -signs of infection - fever or chills, cough, sore throat, pain or trouble passing urine -signs and symptoms of bleeding such as bloody or black, tarry stools; red or dark-brown urine; spitting up blood or brown material that looks like coffee grounds; red spots on the skin; unusual bruising or bleeding from the eye, gums, or nose -signs and symptoms of kidney injury like trouble passing urine or change in the amount  of urine -signs and symptoms of liver injury like dark yellow or brown urine; general ill feeling or flu-like symptoms; light-colored stools; loss of appetite; nausea; right upper belly pain; unusually weak or tired; yellowing of the eyes or skin Side effects that usually do not require medical attention (report to your doctor or health care professional if they continue or are bothersome): -dizziness -hair loss -tiredness -upset stomach -vomiting This list may not describe all possible side effects. Call your doctor for medical advice about side effects. You may report side effects to FDA at 1-800-FDA-1088. Where should I keep my medicine? Keep out of the reach of children. Store at room temperature between 20 and 25 degrees C (68 and 77 degrees F). Protect from light. Throw away any unused medicine after the expiration date. NOTE: This sheet is a summary. It may not cover all possible information. If you have questions about this medicine, talk to your doctor, pharmacist, or health care provider.  2018 Elsevier/Gold Standard (2014-11-05 05:39:22)  

## 2017-08-17 ENCOUNTER — Telehealth: Payer: Self-pay | Admitting: Rheumatology

## 2017-08-17 ENCOUNTER — Telehealth: Payer: Self-pay | Admitting: Physician Assistant

## 2017-08-17 NOTE — Telephone Encounter (Signed)
I called patient this morning to check on how her rash is.  She reports the rash has worsened and she continues to have facial flushing and is experiencing a burning sensation. She has not noticed any benefit since having the dexamethasone IM injection on Sunday.  She reports after her visit with [place] yesterday she went as a walk-in to [facility] where she has been a long time patient.  She was not able to been seen yesterday.  She has not started on Prednisone taper yet due to hoping to be seen by derm but she did take one dose of Claritin yesterday.  She has not noticed any benefit since taking Claritin.  Marissa called [facility] today to try to get the patient an urgent appointment for evaluation before the patient started Prednisone.  The earliest appointment available is tomorrow 08/18/17 at 10:10 with Dr. Fontaine No.  I called the patient to update her.  I also advised her to start her prednisone taper today and to monitor her blood glucose closely.  She was also advised to notify PCP that she will be starting a prednisone taper.  She was instructed to call our office tomorrow to update [place] on her appointment with the dermatologist and to have them forward their notes to [place].

## 2017-08-17 NOTE — Telephone Encounter (Signed)
Patient called stating that she called [facility], but they can't get her in until tomorrow morning.  Patient states she needs to be seen today and is asking if there is another place she can go.  Patient requests a return call.

## 2017-08-18 ENCOUNTER — Telehealth: Payer: Self-pay | Admitting: Rheumatology

## 2017-08-18 ENCOUNTER — Other Ambulatory Visit: Payer: Self-pay | Admitting: Physician Assistant

## 2017-08-18 ENCOUNTER — Ambulatory Visit (INDEPENDENT_AMBULATORY_CARE_PROVIDER_SITE_OTHER): Payer: Medicare HMO | Admitting: Rheumatology

## 2017-08-18 ENCOUNTER — Encounter: Payer: Self-pay | Admitting: Rheumatology

## 2017-08-18 VITALS — BP 130/68 | HR 100 | Resp 14 | Ht 68.5 in | Wt 178.0 lb

## 2017-08-18 DIAGNOSIS — Z8719 Personal history of other diseases of the digestive system: Secondary | ICD-10-CM

## 2017-08-18 DIAGNOSIS — R21 Rash and other nonspecific skin eruption: Secondary | ICD-10-CM | POA: Diagnosis not present

## 2017-08-18 DIAGNOSIS — M170 Bilateral primary osteoarthritis of knee: Secondary | ICD-10-CM | POA: Diagnosis not present

## 2017-08-18 DIAGNOSIS — Z79899 Other long term (current) drug therapy: Secondary | ICD-10-CM | POA: Diagnosis not present

## 2017-08-18 DIAGNOSIS — R768 Other specified abnormal immunological findings in serum: Secondary | ICD-10-CM | POA: Diagnosis not present

## 2017-08-18 DIAGNOSIS — M19041 Primary osteoarthritis, right hand: Secondary | ICD-10-CM | POA: Diagnosis not present

## 2017-08-18 DIAGNOSIS — M5136 Other intervertebral disc degeneration, lumbar region: Secondary | ICD-10-CM

## 2017-08-18 DIAGNOSIS — Z8639 Personal history of other endocrine, nutritional and metabolic disease: Secondary | ICD-10-CM

## 2017-08-18 DIAGNOSIS — M797 Fibromyalgia: Secondary | ICD-10-CM | POA: Diagnosis not present

## 2017-08-18 DIAGNOSIS — M0609 Rheumatoid arthritis without rheumatoid factor, multiple sites: Secondary | ICD-10-CM | POA: Diagnosis not present

## 2017-08-18 DIAGNOSIS — E119 Type 2 diabetes mellitus without complications: Secondary | ICD-10-CM | POA: Diagnosis not present

## 2017-08-18 DIAGNOSIS — M5030 Other cervical disc degeneration, unspecified cervical region: Secondary | ICD-10-CM

## 2017-08-18 DIAGNOSIS — M19042 Primary osteoarthritis, left hand: Secondary | ICD-10-CM

## 2017-08-18 DIAGNOSIS — L270 Generalized skin eruption due to drugs and medicaments taken internally: Secondary | ICD-10-CM | POA: Diagnosis not present

## 2017-08-18 DIAGNOSIS — L418 Other parapsoriasis: Secondary | ICD-10-CM | POA: Diagnosis not present

## 2017-08-18 LAB — IGG, IGA, IGM
IMMUNOGLOBULIN A: 156 mg/dL (ref 81–463)
IgG (Immunoglobin G), Serum: 804 mg/dL (ref 694–1618)
IgM, Serum: 93 mg/dL (ref 48–271)

## 2017-08-18 LAB — CBC WITH DIFFERENTIAL/PLATELET
BASOS ABS: 20 {cells}/uL (ref 0–200)
Basophils Relative: 0.2 %
EOS ABS: 71 {cells}/uL (ref 15–500)
EOS PCT: 0.7 %
HEMATOCRIT: 35.4 % (ref 35.0–45.0)
HEMOGLOBIN: 11.9 g/dL (ref 11.7–15.5)
LYMPHS ABS: 1142 {cells}/uL (ref 850–3900)
MCH: 29.8 pg (ref 27.0–33.0)
MCHC: 33.6 g/dL (ref 32.0–36.0)
MCV: 88.7 fL (ref 80.0–100.0)
MPV: 9.3 fL (ref 7.5–12.5)
Monocytes Relative: 6.5 %
NEUTROS PCT: 81.4 %
Neutro Abs: 8303 cells/uL — ABNORMAL HIGH (ref 1500–7800)
Platelets: 243 10*3/uL (ref 140–400)
RBC: 3.99 10*6/uL (ref 3.80–5.10)
RDW: 13.8 % (ref 11.0–15.0)
Total Lymphocyte: 11.2 %
WBC: 10.2 10*3/uL (ref 3.8–10.8)
WBCMIX: 663 {cells}/uL (ref 200–950)

## 2017-08-18 LAB — COMPLETE METABOLIC PANEL WITH GFR
AG Ratio: 1.7 (calc) (ref 1.0–2.5)
ALBUMIN MSPROF: 4 g/dL (ref 3.6–5.1)
ALKALINE PHOSPHATASE (APISO): 70 U/L (ref 33–130)
ALT: 12 U/L (ref 6–29)
AST: 17 U/L (ref 10–35)
BUN: 13 mg/dL (ref 7–25)
CO2: 28 mmol/L (ref 20–32)
CREATININE: 0.76 mg/dL (ref 0.60–0.93)
Calcium: 9.1 mg/dL (ref 8.6–10.4)
Chloride: 96 mmol/L — ABNORMAL LOW (ref 98–110)
GFR, Est African American: 88 mL/min/{1.73_m2} (ref >=60)
GFR, Est Non African American: 76 mL/min/{1.73_m2} (ref >=60)
GLUCOSE: 113 mg/dL — AB (ref 65–99)
Globulin: 2.3 g/dL (calc) (ref 1.9–3.7)
Potassium: 3.6 mmol/L (ref 3.5–5.3)
Sodium: 134 mmol/L — ABNORMAL LOW (ref 135–146)
Total Bilirubin: 0.4 mg/dL (ref 0.2–1.2)
Total Protein: 6.3 g/dL (ref 6.1–8.1)

## 2017-08-18 LAB — PROTEIN ELECTROPHORESIS, SERUM, WITH REFLEX
Albumin ELP: 3.7 g/dL — ABNORMAL LOW (ref 3.8–4.8)
Alpha 1: 0.3 g/dL (ref 0.2–0.3)
Alpha 2: 0.6 g/dL (ref 0.5–0.9)
BETA 2: 0.3 g/dL (ref 0.2–0.5)
BETA GLOBULIN: 0.5 g/dL (ref 0.4–0.6)
GAMMA GLOBULIN: 0.8 g/dL (ref 0.8–1.7)
Total Protein: 6.2 g/dL (ref 6.1–8.1)

## 2017-08-18 LAB — QUANTIFERON-TB GOLD PLUS
Mitogen-NIL: 7.41 IU/mL
NIL: 0.02 [IU]/mL
QUANTIFERON-TB GOLD PLUS: NEGATIVE
TB1-NIL: <0 IU/mL
TB2-NIL: 0 [IU]/mL

## 2017-08-18 LAB — HEPATITIS B SURFACE ANTIGEN: Hepatitis B Surface Ag: NONREACTIVE

## 2017-08-18 LAB — HEPATITIS B CORE ANTIBODY, IGM: Hep B C IgM: NONREACTIVE

## 2017-08-18 LAB — HEPATITIS C ANTIBODY
Hepatitis C Ab: NONREACTIVE
SIGNAL TO CUT-OFF: 0.02 (ref <1.00)

## 2017-08-18 LAB — HIV ANTIBODY (ROUTINE TESTING W REFLEX): HIV: NONREACTIVE

## 2017-08-18 NOTE — Telephone Encounter (Signed)
Patient called stating she saw Dr. Derrel Nip at [facility] this morning who wants to increase her Prednisone to 40 mg.  Patient was also referred to Dr. Francetta Found at [facility] to manage her diabetes before increasing her Prednisone.  Patient states she is seeing Dr. Hartford Poli in the morning and needs to pick up her lab results this afternoon.  Patient also requested that they be sent to Dr. Derrel Nip.

## 2017-08-18 NOTE — Progress Notes (Signed)
Office Visit Note  Patient: Ashley Perkins             Date of Birth: [birth year]           MRN: [id number]             PCP: Gregor Hams, FNP Referring: Tobie Lords D, FNP Visit Date: 08/18/2017 Occupation: @GUAROCC @    Subjective:  Rash   History of Present Illness: Ashley Perkins is a 77 y.o. female with history of rheumatoid arthritis, fibromyalgia, DDD, and osteoarthritis.  She reports that she was seen by Dr. Derrel Nip at [facility] this morning, who took 2 skin biopsies of her rash.  She states the rash continues to progress and spread.  She took prednisone 20 mg yesterday and today, which has not caused any improvement. She states some of the lesions are vesicular.  She denies being in contact with anyone else with the rash.  The dermatologist recommended she increase her dose of prednisone to 40 mg daily. She is establishing care with an endocrinologist at [facility] tomorrow morning for management of diabetes since she is going to be increasing her dose of prednisone.   Activities of Daily Living:  Patient reports morning stiffness for 0 minutes.   Patient Denies nocturnal pain.  Difficulty dressing/grooming: Denies Difficulty climbing stairs: Denies Difficulty getting out of chair: Denies Difficulty using hands for taps, buttons, cutlery, and/or writing: Denies   Review of Systems  Constitutional: Negative for fatigue.  HENT: Negative for mouth sores, mouth dryness and nose dryness.   Eyes: Negative for pain, visual disturbance and dryness.  Respiratory: Negative for cough, hemoptysis, shortness of breath and difficulty breathing.   Cardiovascular: Negative for chest pain, palpitations, hypertension and swelling in legs/feet.  Gastrointestinal: Negative for blood in stool, constipation and diarrhea.  Endocrine: Negative for increased urination.  Genitourinary: Negative for painful urination.  Musculoskeletal: Negative for arthralgias, joint pain,  joint swelling, myalgias, muscle weakness, morning stiffness, muscle tenderness and myalgias.  Skin: Positive for rash and redness. Negative for color change, pallor, hair loss, nodules/bumps, skin tightness, ulcers and sensitivity to sunlight.  Allergic/Immunologic: Negative for susceptible to infections.  Neurological: Negative for dizziness, numbness, headaches and weakness.  Hematological: Negative for swollen glands.  Psychiatric/Behavioral: Negative for depressed mood and sleep disturbance. The patient is nervous/anxious.     PMFS History:  Patient Active Problem List   Diagnosis Date Noted  . Fibromyalgia 07/15/2017  . Primary osteoarthritis of both hands 07/15/2017  . Primary osteoarthritis of both knees 07/15/2017  . DDD (degenerative disc disease), cervical 07/15/2017  . DDD (degenerative disc disease), lumbar 07/15/2017  . History of knee surgery 07/15/2017  . Elevated liver function tests   . Generalized abdominal pain   . Depression   . Acute pancreatitis 05/07/2014  . Transaminitis 05/07/2014  . DM2 (diabetes mellitus, type 2) ([place]) 05/07/2014    Past Medical History:  Diagnosis Date  . Diabetes mellitus without complication ([place])   . Fibromyalgia   . Hypercholesteremia     Family History  Problem Relation Age of Onset  . Dementia Mother   . Diabetes Father   . Cancer Father   . Heart Problems Father   . Cancer Brother        prostate  . Heart Problems Brother    Past Surgical History:  Procedure Laterality Date  . BACK SURGERY    . CHOLECYSTECTOMY    . KNEE ARTHROSCOPY  06/07/2017  . LAPAROSCOPIC UNILATERAL SALPINGO OOPHERECTOMY    .  TUBAL LIGATION     Social History   Social History Narrative  . Not on file     Objective: Vital Signs: BP 130/68 (BP Location: Right Arm, Patient Position: Sitting, Cuff Size: Normal)   Pulse 100   Resp 14   Ht 5' 8.5" (1.74 m)   Wt 178 lb (80.7 kg)   BMI 26.67 kg/m    Physical Exam  Constitutional: She is  oriented to person, place, and time. She appears well-developed and well-nourished.  HENT:  Head: Normocephalic and atraumatic.  Eyes: Conjunctivae and EOM are normal.  Neck: Normal range of motion.  Cardiovascular: Normal rate, regular rhythm, normal heart sounds and intact distal pulses.  Pulmonary/Chest: Effort normal and breath sounds normal.  Abdominal: Soft. Bowel sounds are normal.  Lymphadenopathy:    She has no cervical adenopathy.  Neurological: She is alert and oriented to person, place, and time.  Skin: Skin is warm and dry. Capillary refill takes less than 2 seconds.  Patient had erythematous maculopapular rash covering her scalp, face, trunk, perineal region and extremities.  She also had few small vesicular lesion scattered.  Psychiatric: She has a normal mood and affect. Her behavior is normal.  Nursing note and vitals reviewed.    Musculoskeletal Exam: C-spine, thoracic spine, and lumbar spine good ROM.  No midline spinal tenderness.  No SI joint tenderness.  Shoulder joints, elbow joints, wrist joints, MCPs, PIPs, and DIPs good ROM with no synovitis.  Hip joints, knee joints, ankle joints, MTPs, PIPs, and DIPs good ROM with no synovitis.    CDAI Exam: No CDAI exam completed.    Investigation: No additional findings.   Imaging: No results found.  Speciality Comments: No specialty comments available.    Procedures:  No procedures performed Allergies: Other   Assessment / Plan:     Visit Diagnoses: Rheumatoid arthritis of multiple sites with negative rheumatoid factor (HCC)-patient had no synovitis on examination today.  She had been doing better with prednisone.  The Plaquenil was working really well for her prior to stopping the Plaquenil.  Rash and other nonspecific skin eruption -patient had a scattered rash all over as described above.  Her rash is definitely worse than yesterday.  She was evaluated by dermatology today and had 2 skin biopsies.  She was  placed on prednisone 40 mg to be started tomorrow after she sees endocrinologist.  She has an appointment with the endocrinologist tomorrow.  Was also seen by her PCP today who has placed her on Zyrtec 2 tablets twice daily.  To complete the work-up I will obtain following labs today.  Patient denies any fever or exposure.  Plan: [place] spotted fvr abs pnl(IgG+IgM), Anti-scleroderma antibody, Fluorescent treponemal ab(fta)-IgG-bld, CBC with Differential/Platelet, Parvovirus B19 antibody, IgG and IgM, RPR, Epstein-Barr virus nuclear antigen antibody, IgG, CK, Pan-ANCA, Sedimentation rate, Beta-2 glycoprotein antibodies, Lupus Anticoagulant Eval w/Reflex, Cardiolipin antibodies, IgG, IgM, IgA, C3 and C4, Anti-DNA antibody, double-stranded, Sjogrens syndrome-B extractable nuclear antibody, Sjogrens syndrome-A extractable nuclear antibody, Anti-Smith antibody, RNP Antibody, Rubeola Antibody, IGM.  I will contact her once the lab results are available.  She has been advised to call us back in case her rash gets worse.  Positive ANA (antinuclear antibody) - Plan: CK, Pan-ANCA, Sedimentation rate, Beta-2 glycoprotein antibodies, Lupus Anticoagulant Eval w/Reflex, Cardiolipin antibodies, IgG, IgM, IgA, C3 and C4, Anti-DNA antibody, double-stranded, Sjogrens syndrome-B extractable nuclear antibody, Sjogrens syndrome-A extractable nuclear antibody, Anti-Smith antibody  High risk medication use-patient has discontinued Plaquenil after the rashes  started.  Fibromyalgia  Primary osteoarthritis of both hands  Primary osteoarthritis of both knees  DDD (degenerative disc disease), cervical  DDD (degenerative disc disease), lumbar  History of pancreatitis  History of diabetes mellitus  History of hypercholesterolemia  History of hypothyroidism    Orders: No orders of the defined types were placed in this encounter.  No orders of the defined types were placed in this encounter.   Face-to-face time  spent with patient was 30 minutes.> 50% of time was spent in counseling and coordination of care.  Follow-Up Instructions: Return in about 1 week (around 08/25/2017) for Rheumatoid arthritis, rash.   Hazel Sams PA-C   I examined and evaluated the patient with Hazel Sams PA. The plan of care was discussed as noted above.  Bo Merino, MD  Note - This record has been created using [employer], [employer].  Chart creation errors have been sought, but may not always  have been located. Such creation errors do not reflect on  the standard of medical care.

## 2017-08-18 NOTE — Progress Notes (Signed)
SPEP revealed mildly low albumin. CBC and CMP stable. All other labs are WNL.

## 2017-08-19 DIAGNOSIS — E1140 Type 2 diabetes mellitus with diabetic neuropathy, unspecified: Secondary | ICD-10-CM | POA: Diagnosis not present

## 2017-08-19 DIAGNOSIS — M069 Rheumatoid arthritis, unspecified: Secondary | ICD-10-CM | POA: Diagnosis not present

## 2017-08-19 DIAGNOSIS — E039 Hypothyroidism, unspecified: Secondary | ICD-10-CM | POA: Diagnosis not present

## 2017-08-20 ENCOUNTER — Telehealth: Payer: Self-pay | Admitting: Rheumatology

## 2017-08-20 DIAGNOSIS — L986 Other infiltrative disorders of the skin and subcutaneous tissue: Secondary | ICD-10-CM | POA: Diagnosis not present

## 2017-08-20 DIAGNOSIS — R1900 Intra-abdominal and pelvic swelling, mass and lump, unspecified site: Secondary | ICD-10-CM | POA: Diagnosis not present

## 2017-08-20 DIAGNOSIS — L538 Other specified erythematous conditions: Secondary | ICD-10-CM | POA: Diagnosis not present

## 2017-08-20 DIAGNOSIS — E079 Disorder of thyroid, unspecified: Secondary | ICD-10-CM | POA: Diagnosis not present

## 2017-08-20 DIAGNOSIS — R21 Rash and other nonspecific skin eruption: Secondary | ICD-10-CM | POA: Insufficient documentation

## 2017-08-20 DIAGNOSIS — E0965 Drug or chemical induced diabetes mellitus with hyperglycemia: Secondary | ICD-10-CM | POA: Diagnosis not present

## 2017-08-20 DIAGNOSIS — R7982 Elevated C-reactive protein (CRP): Secondary | ICD-10-CM | POA: Diagnosis not present

## 2017-08-20 DIAGNOSIS — T380X5A Adverse effect of glucocorticoids and synthetic analogues, initial encounter: Secondary | ICD-10-CM | POA: Diagnosis not present

## 2017-08-20 DIAGNOSIS — D136 Benign neoplasm of pancreas: Secondary | ICD-10-CM | POA: Diagnosis not present

## 2017-08-20 DIAGNOSIS — L270 Generalized skin eruption due to drugs and medicaments taken internally: Secondary | ICD-10-CM | POA: Diagnosis not present

## 2017-08-20 DIAGNOSIS — E1165 Type 2 diabetes mellitus with hyperglycemia: Secondary | ICD-10-CM | POA: Diagnosis not present

## 2017-08-20 DIAGNOSIS — M069 Rheumatoid arthritis, unspecified: Secondary | ICD-10-CM | POA: Diagnosis not present

## 2017-08-20 DIAGNOSIS — E119 Type 2 diabetes mellitus without complications: Secondary | ICD-10-CM | POA: Diagnosis not present

## 2017-08-20 DIAGNOSIS — K5900 Constipation, unspecified: Secondary | ICD-10-CM | POA: Diagnosis not present

## 2017-08-20 DIAGNOSIS — Z66 Do not resuscitate: Secondary | ICD-10-CM | POA: Diagnosis not present

## 2017-08-20 DIAGNOSIS — T378X5A Adverse effect of other specified systemic anti-infectives and antiparasitics, initial encounter: Secondary | ICD-10-CM | POA: Diagnosis not present

## 2017-08-20 NOTE — Telephone Encounter (Signed)
Patient advised to follow up with dermatology. Patient verbalized understanding.

## 2017-08-20 NOTE — Telephone Encounter (Signed)
Patient states rash is increasing, not getting better. Painful where legs touch. Patient request lab results, if they are back. Please call patient to advise if she can take a pain pill to help, or not.

## 2017-08-22 DIAGNOSIS — T380X5A Adverse effect of glucocorticoids and synthetic analogues, initial encounter: Secondary | ICD-10-CM | POA: Insufficient documentation

## 2017-08-22 DIAGNOSIS — D490 Neoplasm of unspecified behavior of digestive system: Secondary | ICD-10-CM | POA: Insufficient documentation

## 2017-08-22 DIAGNOSIS — R739 Hyperglycemia, unspecified: Secondary | ICD-10-CM | POA: Insufficient documentation

## 2017-08-22 LAB — CBC WITH DIFFERENTIAL/PLATELET
Basophils Absolute: 14 {cells}/uL (ref 0–200)
Basophils Relative: 0.1 %
Eosinophils Absolute: 96 {cells}/uL (ref 15–500)
Eosinophils Relative: 0.7 %
HCT: 38.9 % (ref 35.0–45.0)
Hemoglobin: 13.3 g/dL (ref 11.7–15.5)
Lymphs Abs: 575 {cells}/uL — ABNORMAL LOW (ref 850–3900)
MCH: 30.2 pg (ref 27.0–33.0)
MCHC: 34.2 g/dL (ref 32.0–36.0)
MCV: 88.2 fL (ref 80.0–100.0)
MPV: 9.7 fL (ref 7.5–12.5)
Monocytes Relative: 3.1 %
Neutro Abs: 12590 {cells}/uL — ABNORMAL HIGH (ref 1500–7800)
Neutrophils Relative %: 91.9 %
Platelets: 284 Thousand/uL (ref 140–400)
RBC: 4.41 Million/uL (ref 3.80–5.10)
RDW: 13.5 % (ref 11.0–15.0)
Total Lymphocyte: 4.2 %
WBC mixed population: 425 {cells}/uL (ref 200–950)
WBC: 13.7 Thousand/uL — ABNORMAL HIGH (ref 3.8–10.8)

## 2017-08-22 LAB — ANTI-DNA ANTIBODY, DOUBLE-STRANDED: ds DNA Ab: <1 [IU]/mL

## 2017-08-22 LAB — LUPUS ANTICOAGULANT EVAL W/ REFLEX
PTT-LA Screen: 34 s (ref <=40)
dRVVT: 42 s (ref <=45)

## 2017-08-22 LAB — SJOGRENS SYNDROME-A EXTRACTABLE NUCLEAR ANTIBODY: SSA (Ro) (ENA) Antibody, IgG: <1 AI

## 2017-08-22 LAB — PARVOVIRUS B19 ANTIBODY, IGG AND IGM
Parvovirus B19 IgG: 5.1 — ABNORMAL HIGH
Parvovirus B19 IgM: 0.2

## 2017-08-22 LAB — SEDIMENTATION RATE: Sed Rate: 6 mm/h (ref 0–30)

## 2017-08-22 LAB — PAN-ANCA
ANCA SCREEN: NEGATIVE
Myeloperoxidase Abs: <1 AI
Serine Protease 3: <1 AI

## 2017-08-22 LAB — CARDIOLIPIN ANTIBODIES, IGG, IGM, IGA
Anticardiolipin IgA: <11 [APL'U]
Anticardiolipin IgG: <14 [GPL'U]
Anticardiolipin IgM: <12 [MPL'U]

## 2017-08-22 LAB — ROCKY MTN SPOTTED FVR ABS PNL(IGG+IGM)
RMSF IGG: NOT DETECTED
RMSF IgM: NOT DETECTED

## 2017-08-22 LAB — BETA-2 GLYCOPROTEIN ANTIBODIES
Beta-2 Glyco 1 IgA: <9 SAU (ref <=20)
Beta-2 Glyco I IgG: <9 SGU (ref <=20)

## 2017-08-22 LAB — RPR: RPR: NONREACTIVE

## 2017-08-22 LAB — ANTI-SMITH ANTIBODY: ENA SM Ab Ser-aCnc: <1 AI

## 2017-08-22 LAB — C3 AND C4
C3 Complement: 142 mg/dL (ref 83–193)
C4 Complement: 30 mg/dL (ref 15–57)

## 2017-08-22 LAB — RUBEOLA ANTIBODY, IGM: Measles Ab IgM, IFA: <1:20 {titer}

## 2017-08-22 LAB — SJOGRENS SYNDROME-B EXTRACTABLE NUCLEAR ANTIBODY: SSB (La) (ENA) Antibody, IgG: <1 AI

## 2017-08-22 LAB — CK: Total CK: 38 U/L (ref 29–143)

## 2017-08-22 LAB — RNP ANTIBODY: Ribonucleic Protein(ENA) Antibody, IgG: <1 AI

## 2017-08-22 LAB — ANTI-SCLERODERMA ANTIBODY: Scleroderma (Scl-70) (ENA) Antibody, IgG: <1 AI

## 2017-08-22 LAB — EPSTEIN-BARR VIRUS NUCLEAR ANTIGEN ANTIBODY, IGG: EBV NA IgG: 65.7 U/mL — ABNORMAL HIGH

## 2017-08-23 ENCOUNTER — Telehealth: Payer: Self-pay | Admitting: Physician Assistant

## 2017-08-23 NOTE — Telephone Encounter (Signed)
I called the patient to check on how her rash is doing.  She was admitted to [facility] on Friday and is being discharged today.  She was seen by her PCP on Friday who advised her to be seen at [facility]  She reports the rash started to coalesce and was very tender and dark red in appearance.  She had a biopsy performed by [facility] derm that revealed AGEP likely due to Plaquenil. She is no longer taking PLQ. She had a second biopsy while hospitalized but the results are not complete. She was started on Prednisone 80 mg while hospitalized, and she will be discharged on Prednisone 40 mg daily.  She will be following up outpatient [facility] dermatology as well as following up with her endocrinologist to manage DM. Upon admission the patient was constipated and an abdominal CT was performed which per patient revealed fecal impaction and 2 lesions on her pancreas.  She will be following up for further evaluation of these lesions outpatient.  We will see her back in our office in 2 months.  We will not restart her on any medications until after the rash has resolved completely.  She was advised to have these records sent to [place].

## 2017-08-23 NOTE — Progress Notes (Signed)
I called the patient to discuss recent lab work.  We are going to mail a copy of these labs to the patient.

## 2017-08-25 DIAGNOSIS — R21 Rash and other nonspecific skin eruption: Secondary | ICD-10-CM | POA: Diagnosis not present

## 2017-08-25 DIAGNOSIS — T380X5A Adverse effect of glucocorticoids and synthetic analogues, initial encounter: Secondary | ICD-10-CM | POA: Diagnosis not present

## 2017-08-25 DIAGNOSIS — R739 Hyperglycemia, unspecified: Secondary | ICD-10-CM | POA: Diagnosis not present

## 2017-08-25 DIAGNOSIS — K862 Cyst of pancreas: Secondary | ICD-10-CM | POA: Diagnosis not present

## 2017-08-25 DIAGNOSIS — K5900 Constipation, unspecified: Secondary | ICD-10-CM | POA: Diagnosis not present

## 2017-08-27 DIAGNOSIS — M170 Bilateral primary osteoarthritis of knee: Secondary | ICD-10-CM | POA: Diagnosis not present

## 2017-08-27 DIAGNOSIS — G894 Chronic pain syndrome: Secondary | ICD-10-CM | POA: Diagnosis not present

## 2017-08-27 DIAGNOSIS — M069 Rheumatoid arthritis, unspecified: Secondary | ICD-10-CM | POA: Diagnosis not present

## 2017-08-27 DIAGNOSIS — Z79891 Long term (current) use of opiate analgesic: Secondary | ICD-10-CM | POA: Diagnosis not present

## 2017-08-27 DIAGNOSIS — M2550 Pain in unspecified joint: Secondary | ICD-10-CM | POA: Diagnosis not present

## 2017-08-27 DIAGNOSIS — M797 Fibromyalgia: Secondary | ICD-10-CM | POA: Diagnosis not present

## 2017-08-30 DIAGNOSIS — L270 Generalized skin eruption due to drugs and medicaments taken internally: Secondary | ICD-10-CM | POA: Diagnosis not present

## 2017-08-30 DIAGNOSIS — R109 Unspecified abdominal pain: Secondary | ICD-10-CM | POA: Diagnosis not present

## 2017-08-30 DIAGNOSIS — T50905A Adverse effect of unspecified drugs, medicaments and biological substances, initial encounter: Secondary | ICD-10-CM | POA: Diagnosis not present

## 2017-09-01 DIAGNOSIS — M7541 Impingement syndrome of right shoulder: Secondary | ICD-10-CM | POA: Diagnosis not present

## 2017-09-03 DIAGNOSIS — D490 Neoplasm of unspecified behavior of digestive system: Secondary | ICD-10-CM | POA: Diagnosis not present

## 2017-09-03 DIAGNOSIS — R109 Unspecified abdominal pain: Secondary | ICD-10-CM | POA: Diagnosis not present

## 2017-09-06 ENCOUNTER — Telehealth: Payer: Self-pay | Admitting: Rheumatology

## 2017-09-06 NOTE — Telephone Encounter (Signed)
I called to check on patient.  Patient states that she was seen at [facility] where she was hospitalized for 3 days.  She states she had a lot of bloating and constipation.  She had a CT scan of her abdomen which showed some pancreatic lesions but they were very small.  She also had a skin biopsy which was consistent with drug related reaction per patient.  She states she was given some topical agents and she was in burn wrap during the hospitalization.  Her prednisone was increased to 80 mg a day along with insulin.  She was discharged on prednisone 30 mg p.o. daily.  She states her rashes almost resolved and her skin is peeling now.  She is currently on prednisone 20 mg p.o. daily.  She will follow-up with the dermatologist. Bo Merino, MD

## 2017-09-09 DIAGNOSIS — M797 Fibromyalgia: Secondary | ICD-10-CM | POA: Diagnosis not present

## 2017-09-09 DIAGNOSIS — K5289 Other specified noninfective gastroenteritis and colitis: Secondary | ICD-10-CM | POA: Diagnosis not present

## 2017-09-09 DIAGNOSIS — K5900 Constipation, unspecified: Secondary | ICD-10-CM | POA: Diagnosis not present

## 2017-09-09 DIAGNOSIS — Z800 Family history of malignant neoplasm of digestive organs: Secondary | ICD-10-CM | POA: Diagnosis not present

## 2017-09-09 DIAGNOSIS — Z8719 Personal history of other diseases of the digestive system: Secondary | ICD-10-CM | POA: Diagnosis not present

## 2017-09-09 DIAGNOSIS — R21 Rash and other nonspecific skin eruption: Secondary | ICD-10-CM | POA: Diagnosis not present

## 2017-09-09 DIAGNOSIS — Z7984 Long term (current) use of oral hypoglycemic drugs: Secondary | ICD-10-CM | POA: Diagnosis not present

## 2017-09-09 DIAGNOSIS — K219 Gastro-esophageal reflux disease without esophagitis: Secondary | ICD-10-CM | POA: Diagnosis not present

## 2017-09-09 DIAGNOSIS — E119 Type 2 diabetes mellitus without complications: Secondary | ICD-10-CM | POA: Diagnosis not present

## 2017-09-13 DIAGNOSIS — T50905A Adverse effect of unspecified drugs, medicaments and biological substances, initial encounter: Secondary | ICD-10-CM | POA: Diagnosis not present

## 2017-09-13 DIAGNOSIS — L270 Generalized skin eruption due to drugs and medicaments taken internally: Secondary | ICD-10-CM | POA: Diagnosis not present

## 2017-09-13 DIAGNOSIS — L820 Inflamed seborrheic keratosis: Secondary | ICD-10-CM | POA: Diagnosis not present

## 2017-09-15 DIAGNOSIS — T378X5S Adverse effect of other specified systemic anti-infectives and antiparasitics, sequela: Secondary | ICD-10-CM | POA: Diagnosis not present

## 2017-09-15 DIAGNOSIS — R69 Illness, unspecified: Secondary | ICD-10-CM | POA: Diagnosis not present

## 2017-09-15 DIAGNOSIS — M797 Fibromyalgia: Secondary | ICD-10-CM | POA: Diagnosis not present

## 2017-09-15 DIAGNOSIS — M069 Rheumatoid arthritis, unspecified: Secondary | ICD-10-CM | POA: Diagnosis not present

## 2017-09-15 DIAGNOSIS — E7800 Pure hypercholesterolemia, unspecified: Secondary | ICD-10-CM | POA: Diagnosis not present

## 2017-09-15 DIAGNOSIS — Z0000 Encounter for general adult medical examination without abnormal findings: Secondary | ICD-10-CM | POA: Diagnosis not present

## 2017-09-15 DIAGNOSIS — E119 Type 2 diabetes mellitus without complications: Secondary | ICD-10-CM | POA: Diagnosis not present

## 2017-09-15 DIAGNOSIS — G4700 Insomnia, unspecified: Secondary | ICD-10-CM | POA: Diagnosis not present

## 2017-09-15 DIAGNOSIS — E039 Hypothyroidism, unspecified: Secondary | ICD-10-CM | POA: Diagnosis not present

## 2017-09-15 DIAGNOSIS — R600 Localized edema: Secondary | ICD-10-CM | POA: Diagnosis not present

## 2017-09-22 ENCOUNTER — Ambulatory Visit: Payer: Self-pay | Admitting: Physician Assistant

## 2017-09-22 ENCOUNTER — Encounter: Payer: Self-pay | Admitting: Physician Assistant

## 2017-09-22 ENCOUNTER — Ambulatory Visit: Payer: Medicare HMO | Admitting: Physician Assistant

## 2017-09-22 ENCOUNTER — Encounter (INDEPENDENT_AMBULATORY_CARE_PROVIDER_SITE_OTHER): Payer: Self-pay

## 2017-09-22 VITALS — BP 113/60 | HR 80 | Resp 16 | Ht 68.5 in | Wt 180.0 lb

## 2017-09-22 DIAGNOSIS — R21 Rash and other nonspecific skin eruption: Secondary | ICD-10-CM

## 2017-09-22 DIAGNOSIS — M5030 Other cervical disc degeneration, unspecified cervical region: Secondary | ICD-10-CM

## 2017-09-22 DIAGNOSIS — Z8639 Personal history of other endocrine, nutritional and metabolic disease: Secondary | ICD-10-CM

## 2017-09-22 DIAGNOSIS — M19041 Primary osteoarthritis, right hand: Secondary | ICD-10-CM

## 2017-09-22 DIAGNOSIS — Z8719 Personal history of other diseases of the digestive system: Secondary | ICD-10-CM

## 2017-09-22 DIAGNOSIS — M170 Bilateral primary osteoarthritis of knee: Secondary | ICD-10-CM

## 2017-09-22 DIAGNOSIS — M797 Fibromyalgia: Secondary | ICD-10-CM

## 2017-09-22 DIAGNOSIS — M19042 Primary osteoarthritis, left hand: Secondary | ICD-10-CM

## 2017-09-22 DIAGNOSIS — M0609 Rheumatoid arthritis without rheumatoid factor, multiple sites: Secondary | ICD-10-CM | POA: Diagnosis not present

## 2017-09-22 DIAGNOSIS — M5136 Other intervertebral disc degeneration, lumbar region: Secondary | ICD-10-CM | POA: Diagnosis not present

## 2017-09-22 DIAGNOSIS — Z79899 Other long term (current) drug therapy: Secondary | ICD-10-CM

## 2017-09-22 DIAGNOSIS — R768 Other specified abnormal immunological findings in serum: Secondary | ICD-10-CM

## 2017-09-22 DIAGNOSIS — R5383 Other fatigue: Secondary | ICD-10-CM

## 2017-09-22 NOTE — Progress Notes (Deleted)
Office Visit Note  Patient: Ashley Perkins             Date of Birth: 03/16/1941           MRN: [id number]             PCP: Gregor Hams, FNP Visit Date: 09/22/2017   Assessment: Visit Diagnoses: Rheumatoid arthritis of multiple sites with negative rheumatoid factor ([place])  High risk medication use  Rash and other nonspecific skin eruption  Positive ANA (antinuclear antibody)  Fibromyalgia  Primary osteoarthritis of both hands  Primary osteoarthritis of both knees  DDD (degenerative disc disease), cervical  DDD (degenerative disc disease), lumbar  History of pancreatitis  History of hypercholesterolemia  History of diabetes mellitus  History of hypothyroidism  Other fatigue   Follow-Up Instructions: Return for Rheumatoid arthritis, Osteoarthritis, Fibromyalgia.  Orders: No orders of the defined types were placed in this encounter.  No orders of the defined types were placed in this encounter.    Subjective:    Allergies: Hydroxychloroquine and Other   Activities of Daily Living: ***   History of Present Illness: ADRINNE SZE is a 77 y.o. female ***   Review of Systems  Constitutional: Positive for activity change.  HENT: Positive for mouth dryness.   Eyes: Negative for dryness.  Respiratory: Negative for shortness of breath.   Cardiovascular: Positive for swelling in legs/feet.  Gastrointestinal: Positive for constipation.  Endocrine: Positive for cold intolerance.  Genitourinary: Negative for difficulty urinating.  Musculoskeletal: Positive for gait problem and muscle weakness.  Skin: Positive for rash.  Neurological: Positive for numbness.  Hematological: Negative for bruising/bleeding tendency.  Psychiatric/Behavioral: Positive for sleep disturbance.     Investigation: No additional findings.   Objective: Vital Signs: BP 113/60 (BP Location: Left Arm, Patient Position: Sitting, Cuff Size: Normal)   Pulse 80   Resp 16   Ht 5'  8.5" (1.74 m)   Wt 180 lb (81.6 kg)   BMI 26.97 kg/m    Physical Exam   Musculoskeletal Exam: ***  CDAI Exam: No CDAI exam completed.  CDAI comments:  Speciality Comments: No specialty comments available.  Imaging: No results found.   PMFS History:  Patient Active Problem List   Diagnosis Date Noted  . Fibromyalgia 07/15/2017  . Primary osteoarthritis of both hands 07/15/2017  . Primary osteoarthritis of both knees 07/15/2017  . DDD (degenerative disc disease), cervical 07/15/2017  . DDD (degenerative disc disease), lumbar 07/15/2017  . History of knee surgery 07/15/2017  . Elevated liver function tests   . Generalized abdominal pain   . Depression   . Acute pancreatitis 05/07/2014  . Transaminitis 05/07/2014  . DM2 (diabetes mellitus, type 2) ([place]) 05/07/2014    Past Medical History:  Diagnosis Date  . Diabetes mellitus without complication ([place])   . Fibromyalgia   . Hypercholesteremia     Family History  Problem Relation Age of Onset  . Dementia Mother   . Diabetes Father   . Cancer Father   . Heart Problems Father   . Cancer Brother        prostate  . Heart Problems Brother    Past Surgical History:  Procedure Laterality Date  . BACK SURGERY    . CHOLECYSTECTOMY    . KNEE ARTHROSCOPY  06/07/2017  . LAPAROSCOPIC UNILATERAL SALPINGO OOPHERECTOMY    . TUBAL LIGATION     Social History   Social History Narrative  . Not on file     Procedures:  No procedures performed  Gerlean Ren, RT  Note - This record has been created using [employer]. Chart creation errors have been sought, but may not always have been located. Such creation errors do not reflect on the standard of medical care.

## 2017-09-22 NOTE — Progress Notes (Signed)
Office Visit Note  Patient: Ashley Perkins             Date of Birth: Jun 23, 1940           MRN: [id number]             PCP: Gregor Hams, FNP Referring: Tobie Lords D, FNP Visit Date: 09/22/2017 Occupation: @GUAROCC @    Subjective:  Rash   History of Present Illness: Ashley Perkins is a 77 y.o. female with history of seronegative rheumatoid arthritis, osteoarthritis, fibromyalgia, and DDD.  Patient was hospitalized at [facility] the beginning of June and was placed on prednisone 80 mg daily.  She tapered off prednisone 11 days ago.  She continues to have very sensitive skin.  Her skin has been very dry and tender to the touch.  She has been using CeraVe.  She currently has peeling skin on the plantar aspect of bilateral feet.  She is also suffering from pitting edema bilaterally.  She has been elevating her feet on a regular basis.  She has not yet started on furosemide due to being apprehensive side effects.  She continues to have mild facial flushing as well.  She is also been bruising very easily.  She has a rash on the posterior aspect of bilateral forearms.  She reports that she is a dermatologist on 09/13/2017 who recommended using CeraVe on a daily basis.  She has been trying to protect her skin from the sun by using an umbrella and sun protective clothing.  She reports she is unable to travel to visit her son on 09/09/2017 due to the ongoing rash and edema. She denies any joint pain or joint swelling at this time.  She denies any recent fibromyalgia pain or flares.  She denies any muscle aches or muscle tenderness at this time. She continues to have constipation has been taking Linzess.  She is also been taking Metamucil daily.  She is scheduled for colonoscopy on 10/20/2017.    Activities of Daily Living:  Patient reports morning stiffness for 0  minutes.   Patient Denies nocturnal pain.  Difficulty dressing/grooming: Denies Difficulty climbing stairs: Denies Difficulty getting out  of chair: Denies Difficulty using hands for taps, buttons, cutlery, and/or writing: Denies   Review of Systems  Constitutional: Negative for fatigue.  HENT: Negative for mouth sores, mouth dryness and nose dryness.   Eyes: Negative for pain, visual disturbance and dryness.  Respiratory: Negative for cough, hemoptysis, shortness of breath and difficulty breathing.   Cardiovascular: Positive for swelling in legs/feet. Negative for chest pain, palpitations and hypertension.  Gastrointestinal: Positive for constipation. Negative for blood in stool and diarrhea.  Endocrine: Negative for increased urination.  Genitourinary: Negative for painful urination.  Musculoskeletal: Negative for arthralgias, joint pain, joint swelling, myalgias, muscle weakness, morning stiffness, muscle tenderness and myalgias.  Skin: Positive for rash and redness. Negative for color change, pallor, hair loss, nodules/bumps, skin tightness, ulcers and sensitivity to sunlight.  Allergic/Immunologic: Negative for susceptible to infections.  Neurological: Negative for dizziness, numbness, headaches and weakness.  Hematological: Negative for swollen glands.  Psychiatric/Behavioral: Negative for depressed mood and sleep disturbance. The patient is not nervous/anxious.     PMFS History:  Patient Active Problem List   Diagnosis Date Noted  . Fibromyalgia 07/15/2017  . Primary osteoarthritis of both hands 07/15/2017  . Primary osteoarthritis of both knees 07/15/2017  . DDD (degenerative disc disease), cervical 07/15/2017  . DDD (degenerative disc disease), lumbar 07/15/2017  . History of  knee surgery 07/15/2017  . Elevated liver function tests   . Generalized abdominal pain   . Depression   . Acute pancreatitis 05/07/2014  . Transaminitis 05/07/2014  . DM2 (diabetes mellitus, type 2) ([place]) 05/07/2014    Past Medical History:  Diagnosis Date  . Diabetes mellitus without complication ([place])   . Fibromyalgia   .  Hypercholesteremia     Family History  Problem Relation Age of Onset  . Dementia Mother   . Diabetes Father   . Cancer Father   . Heart Problems Father   . Cancer Brother        prostate  . Heart Problems Brother    Past Surgical History:  Procedure Laterality Date  . BACK SURGERY    . CHOLECYSTECTOMY    . KNEE ARTHROSCOPY  06/07/2017  . LAPAROSCOPIC UNILATERAL SALPINGO OOPHERECTOMY    . TUBAL LIGATION     Social History   Social History Narrative  . Not on file     Objective: Vital Signs: BP 113/60 (BP Location: Left Arm, Patient Position: Sitting, Cuff Size: Normal)   Pulse 80   Resp 16   Ht 5' 8.5" (1.74 m)   Wt 180 lb (81.6 kg)   BMI 26.97 kg/m    Physical Exam  Constitutional: She is oriented to person, place, and time. She appears well-developed and well-nourished.  HENT:  Head: Normocephalic and atraumatic.  Eyes: Conjunctivae and EOM are normal.  Neck: Normal range of motion.  Cardiovascular: Normal rate, regular rhythm, normal heart sounds and intact distal pulses.  Pulmonary/Chest: Effort normal and breath sounds normal.  Abdominal: Soft. Bowel sounds are normal. She exhibits distension. There is no tenderness. There is no guarding.  Musculoskeletal: She exhibits edema.  Lymphadenopathy:    She has no cervical adenopathy.  Neurological: She is alert and oriented to person, place, and time.  Skin: Skin is warm and dry. Capillary refill takes less than 2 seconds. Rash noted.  Psychiatric: She has a normal mood and affect. Her behavior is normal.  Nursing note and vitals reviewed.    Musculoskeletal Exam: C-spine, thoracic spine, lumbar spine good range of motion.  No midline spinal tenderness.  No SI joint tenderness.  Shoulder joints, elbow joints, wrist joints, MCPs, PIPs, DIPs good range of motion with no synovitis.  Complete fist formation bilaterally.  Hip joints, knee joints, ankle joints, MTPs, PIPs, DIPs good range of motion with no synovitis.  No  warmth or effusion of bilateral knee joints.  No tenderness of trochanteric bursa bilaterally.  CDAI Exam: CDAI Homunculus Exam:   Joint Counts:  CDAI Tender Joint count: 0 CDAI Swollen Joint count: 0  Global Assessments:  Patient Global Assessment: 0 Provider Global Assessment: 0    Investigation: No additional findings. CBC Latest Ref Rng & Units 08/18/2017 08/16/2017 07/15/2017  WBC 3.8 - 10.8 Thousand/uL 13.7(H) 10.2 6.7  Hemoglobin 11.7 - 15.5 g/dL 13.3 11.9 13.0  Hematocrit 35.0 - 45.0 % 38.9 35.4 38.4  Platelets 140 - 400 Thousand/uL 284 243 292   CMP Latest Ref Rng & Units 08/16/2017 08/16/2017 07/15/2017  Glucose 65 - 99 mg/dL 113(H) - 148(H)  BUN 7 - 25 mg/dL 13 - 11  Creatinine 0.60 - 0.93 mg/dL 0.76 - 0.72  Sodium 135 - 146 mmol/L 134(L) - 137  Potassium 3.5 - 5.3 mmol/L 3.6 - 4.0  Chloride 98 - 110 mmol/L 96(L) - 99  CO2 20 - 32 mmol/L 28 - 30  Calcium 8.6 - 10.4 mg/dL  9.1 - 9.2  Total Protein 6.1 - 8.1 g/dL 6.3 6.2 6.5  Total Bilirubin 0.2 - 1.2 mg/dL 0.4 - 0.5  Alkaline Phos 39 - 117 U/L - - -  AST 10 - 35 U/L 17 - 16  ALT 6 - 29 U/L 12 - 9     Imaging: No results found.  Speciality Comments: No specialty comments available.    Procedures:  No procedures performed Allergies: Hydroxychloroquine and Other    Assessment / Plan:     Visit Diagnoses: Rheumatoid arthritis of multiple sites with negative rheumatoid factor ([place]): She has no active synovitis on exam.  She has no joint pain or joint swelling at this time.  Her arthritis flares.  She finished a prednisone taper 11 days ago.  She discontinued Plaquenil on 08/16/2017.  Once her rash fully resolves we will discuss starting her on methotrexate.  High risk medication use: She discontinued Plaquenil on 08/16/2017.  Rash and other nonspecific skin eruption: She developed red pruritic pustular rash on her torso on 08/13/2017 which generalized to involve the torso, proximal extremities, and scalp.  She discontinued  Plaquenil on 08/16/2017.  She had 2 biopsy-proven results revealing AGEP due to plaquenil.  She was admitted and treated with IV methylprednisolone 1 mg/kg/day and tac wet wraps 3 times daily at [facility].  Upon discharge prednisone was tapered.  She finished prednisone 11 days ago.  She saw the dermatologist, Dr. Brayton El, at [facility] on 09/13/2017.  She continues to have diffuse xerosis.  She has been using CeraVe daily.  She has been practicing sun protection by using umbrella as well as sun protective clothing.  She has been trying to stay out of the sun as much as possible.  She has several irritated seborrheic keratosis on back and scattered on her skin.  She had cryosurgery performed by the dermatologist. she has pitting edma bilaterally.  She has not started on furosemide due to concern for side effects.  We discussed the importance of limiting sodium intake as well as using compression stockings.  She will continue elevating her feet on a regular basis.  She was provided a note stating that she is unable to travel on 09/09/2017 due to being treated for ongoing medical conditions.  Positive ANA (antinuclear antibody)  Fibromyalgia: She has not had any recent fibromyalgia flares.  She has no generalized muscle aches or muscle tenderness at this time.  She has been sleeping well and has no fatigue at this time.  Primary osteoarthritis of both hands: She has no discomfort in bilateral hands at this time.  She is complete fist formation bilaterally.  She has osteoarthritic changes in her hands but no synovitis was noted.  Primary osteoarthritis of both knees: No warmth or effusion on exam.  Good range of motion.  No discomfort at this time.  DDD (degenerative disc disease), cervical: She has good range of motion with no discomfort at this time.  DDD (degenerative disc disease), lumbar: No midline spinal tenderness.  No discomfort at this time.  Other medical conditions are listed as follows:  History of  pancreatitis  History of hypercholesterolemia  History of diabetes mellitus  History of hypothyroidism  Other fatigue    Orders: No orders of the defined types were placed in this encounter.  No orders of the defined types were placed in this encounter.   Face-to-face time spent with patient was 30 minutes. Greater than 50% of time was spent in counseling and coordination of care.  Follow-Up Instructions: Return in about 3 months (around 12/23/2017) for Rheumatoid arthritis, Osteoarthritis, Fibromyalgia.   Ofilia Neas, PA-C   I examined and evaluated the patient with Hazel Sams PA. The plan of care was discussed as noted above.  Bo Merino, MD  Note - This record has been created using Editor, commissioning.  Chart creation errors have been sought, but may not always  have been located. Such creation errors do not reflect on  the standard of medical care.

## 2017-09-23 DIAGNOSIS — G479 Sleep disorder, unspecified: Secondary | ICD-10-CM | POA: Diagnosis not present

## 2017-09-23 DIAGNOSIS — T50905D Adverse effect of unspecified drugs, medicaments and biological substances, subsequent encounter: Secondary | ICD-10-CM | POA: Diagnosis not present

## 2017-09-23 DIAGNOSIS — R69 Illness, unspecified: Secondary | ICD-10-CM | POA: Diagnosis not present

## 2017-09-24 DIAGNOSIS — L821 Other seborrheic keratosis: Secondary | ICD-10-CM | POA: Diagnosis not present

## 2017-09-24 DIAGNOSIS — L270 Generalized skin eruption due to drugs and medicaments taken internally: Secondary | ICD-10-CM | POA: Diagnosis not present

## 2017-09-30 ENCOUNTER — Ambulatory Visit: Payer: Self-pay | Admitting: Rheumatology

## 2017-10-05 DIAGNOSIS — K5289 Other specified noninfective gastroenteritis and colitis: Secondary | ICD-10-CM | POA: Diagnosis not present

## 2017-10-05 DIAGNOSIS — M069 Rheumatoid arthritis, unspecified: Secondary | ICD-10-CM | POA: Diagnosis not present

## 2017-10-05 DIAGNOSIS — Z8719 Personal history of other diseases of the digestive system: Secondary | ICD-10-CM | POA: Diagnosis not present

## 2017-10-05 DIAGNOSIS — M797 Fibromyalgia: Secondary | ICD-10-CM | POA: Diagnosis not present

## 2017-10-05 DIAGNOSIS — Z800 Family history of malignant neoplasm of digestive organs: Secondary | ICD-10-CM | POA: Diagnosis not present

## 2017-10-05 DIAGNOSIS — K219 Gastro-esophageal reflux disease without esophagitis: Secondary | ICD-10-CM | POA: Diagnosis not present

## 2017-10-06 DIAGNOSIS — R69 Illness, unspecified: Secondary | ICD-10-CM | POA: Diagnosis not present

## 2017-10-13 DIAGNOSIS — G8929 Other chronic pain: Secondary | ICD-10-CM | POA: Diagnosis not present

## 2017-10-13 DIAGNOSIS — M160 Bilateral primary osteoarthritis of hip: Secondary | ICD-10-CM | POA: Diagnosis not present

## 2017-10-13 DIAGNOSIS — M2550 Pain in unspecified joint: Secondary | ICD-10-CM | POA: Diagnosis not present

## 2017-10-13 DIAGNOSIS — G894 Chronic pain syndrome: Secondary | ICD-10-CM | POA: Diagnosis not present

## 2017-10-13 DIAGNOSIS — Z79891 Long term (current) use of opiate analgesic: Secondary | ICD-10-CM | POA: Diagnosis not present

## 2017-10-13 DIAGNOSIS — M47816 Spondylosis without myelopathy or radiculopathy, lumbar region: Secondary | ICD-10-CM | POA: Diagnosis not present

## 2017-10-13 DIAGNOSIS — M25552 Pain in left hip: Secondary | ICD-10-CM | POA: Diagnosis not present

## 2017-10-13 DIAGNOSIS — M170 Bilateral primary osteoarthritis of knee: Secondary | ICD-10-CM | POA: Diagnosis not present

## 2017-10-13 DIAGNOSIS — M069 Rheumatoid arthritis, unspecified: Secondary | ICD-10-CM | POA: Diagnosis not present

## 2017-10-13 DIAGNOSIS — M797 Fibromyalgia: Secondary | ICD-10-CM | POA: Diagnosis not present

## 2017-10-13 DIAGNOSIS — M533 Sacrococcygeal disorders, not elsewhere classified: Secondary | ICD-10-CM | POA: Diagnosis not present

## 2017-10-13 DIAGNOSIS — M25551 Pain in right hip: Secondary | ICD-10-CM | POA: Diagnosis not present

## 2017-10-13 DIAGNOSIS — M47812 Spondylosis without myelopathy or radiculopathy, cervical region: Secondary | ICD-10-CM | POA: Diagnosis not present

## 2017-10-14 DIAGNOSIS — L821 Other seborrheic keratosis: Secondary | ICD-10-CM | POA: Diagnosis not present

## 2017-10-14 DIAGNOSIS — E119 Type 2 diabetes mellitus without complications: Secondary | ICD-10-CM | POA: Diagnosis not present

## 2017-10-14 DIAGNOSIS — Z7984 Long term (current) use of oral hypoglycemic drugs: Secondary | ICD-10-CM | POA: Diagnosis not present

## 2017-10-14 DIAGNOSIS — M069 Rheumatoid arthritis, unspecified: Secondary | ICD-10-CM | POA: Diagnosis not present

## 2017-10-18 ENCOUNTER — Ambulatory Visit: Payer: Medicare HMO | Admitting: Rheumatology

## 2017-10-18 ENCOUNTER — Encounter: Payer: Self-pay | Admitting: Rheumatology

## 2017-10-18 VITALS — BP 118/74 | HR 76 | Resp 14 | Ht 68.0 in | Wt 180.0 lb

## 2017-10-18 DIAGNOSIS — R21 Rash and other nonspecific skin eruption: Secondary | ICD-10-CM

## 2017-10-18 DIAGNOSIS — M0609 Rheumatoid arthritis without rheumatoid factor, multiple sites: Secondary | ICD-10-CM | POA: Diagnosis not present

## 2017-10-18 DIAGNOSIS — M19041 Primary osteoarthritis, right hand: Secondary | ICD-10-CM | POA: Diagnosis not present

## 2017-10-18 DIAGNOSIS — M170 Bilateral primary osteoarthritis of knee: Secondary | ICD-10-CM | POA: Diagnosis not present

## 2017-10-18 DIAGNOSIS — M797 Fibromyalgia: Secondary | ICD-10-CM

## 2017-10-18 DIAGNOSIS — Z79899 Other long term (current) drug therapy: Secondary | ICD-10-CM | POA: Diagnosis not present

## 2017-10-18 DIAGNOSIS — M5030 Other cervical disc degeneration, unspecified cervical region: Secondary | ICD-10-CM | POA: Diagnosis not present

## 2017-10-18 DIAGNOSIS — R768 Other specified abnormal immunological findings in serum: Secondary | ICD-10-CM | POA: Diagnosis not present

## 2017-10-18 DIAGNOSIS — M25552 Pain in left hip: Secondary | ICD-10-CM

## 2017-10-18 DIAGNOSIS — M25551 Pain in right hip: Secondary | ICD-10-CM

## 2017-10-18 DIAGNOSIS — Z8639 Personal history of other endocrine, nutritional and metabolic disease: Secondary | ICD-10-CM

## 2017-10-18 DIAGNOSIS — M5136 Other intervertebral disc degeneration, lumbar region: Secondary | ICD-10-CM

## 2017-10-18 DIAGNOSIS — Z8719 Personal history of other diseases of the digestive system: Secondary | ICD-10-CM

## 2017-10-18 DIAGNOSIS — M19042 Primary osteoarthritis, left hand: Secondary | ICD-10-CM

## 2017-10-18 DIAGNOSIS — R5383 Other fatigue: Secondary | ICD-10-CM

## 2017-10-18 MED ORDER — MELOXICAM 7.5 MG PO TABS: 7.5000 mg | ORAL_TABLET | Freq: Every day | ORAL | 1 refills | Status: DC

## 2017-10-18 NOTE — Patient Instructions (Signed)
Please return in 1 month to get your lab work which will include CBC, CMP.

## 2017-10-18 NOTE — Progress Notes (Addendum)
Office Visit Note  Patient: Ashley Perkins             Date of Birth: Sep 26, 1940           MRN: [id number]             PCP: Gregor Hams, FNP Referring: Tobie Lords D, FNP Visit Date: 10/18/2017 Occupation: @GUAROCC @  Subjective:  Bilateral hip pain   History of Present Illness: Ashley Perkins is a 77 y.o. female with history of seronegative rheumatoid arthritis, ostearthritis, and DDD.  She discontinued PLQ on 08/16/17.  She has been off of Prednisone for 1 month.  She reports 3 weeks ago she developed bilateral hip pain.  She is having bilateral groin pain.  She had a x-ray of the right hip performed by her pain management specialist.  She denies any knee pain.  She is having muscle aches in the lower extremities.  She states the pain is most severe when she is transitioning from a seated position to standing position.  She takes ibuprofen 2 tablets at bedtime for pain relief.  She has not restarted going to water aerobics.   Activities of Daily Living:  Patient reports morning stiffness for 2-5  minutes.   Patient Reports nocturnal pain.  Difficulty dressing/grooming: Denies Difficulty climbing stairs: Reports Difficulty getting out of chair: Reports Difficulty using hands for taps, buttons, cutlery, and/or writing: Denies  Review of Systems  Constitutional: Negative for fatigue.  HENT: Positive for mouth dryness. Negative for mouth sores and nose dryness.   Eyes: Negative for pain, visual disturbance and dryness.  Respiratory: Negative for cough, hemoptysis, shortness of breath and difficulty breathing.   Cardiovascular: Negative for chest pain, palpitations, hypertension and swelling in legs/feet.  Gastrointestinal: Negative for blood in stool, constipation and diarrhea.  Endocrine: Negative for increased urination.  Genitourinary: Negative for painful urination.  Musculoskeletal: Positive for arthralgias, joint pain and morning stiffness. Negative for joint swelling,  myalgias, muscle weakness, muscle tenderness and myalgias.  Skin: Positive for rash and sensitivity to sunlight. Negative for color change, pallor, hair loss, nodules/bumps, skin tightness and ulcers.  Allergic/Immunologic: Negative for susceptible to infections.  Neurological: Negative for dizziness, numbness, headaches and weakness.  Hematological: Negative for swollen glands.  Psychiatric/Behavioral: Negative for depressed mood and sleep disturbance. The patient is nervous/anxious (on Xanax PRN).     PMFS History:  Patient Active Problem List   Diagnosis Date Noted  . Fibromyalgia 07/15/2017  . Primary osteoarthritis of both hands 07/15/2017  . Primary osteoarthritis of both knees 07/15/2017  . DDD (degenerative disc disease), cervical 07/15/2017  . DDD (degenerative disc disease), lumbar 07/15/2017  . History of knee surgery 07/15/2017  . Elevated liver function tests   . Generalized abdominal pain   . Depression   . Acute pancreatitis 05/07/2014  . Transaminitis 05/07/2014  . DM2 (diabetes mellitus, type 2) ([place]) 05/07/2014    Past Medical History:  Diagnosis Date  . Diabetes mellitus without complication ([place])   . Fibromyalgia   . Hypercholesteremia     Family History  Problem Relation Age of Onset  . Dementia Mother   . Diabetes Father   . Cancer Father   . Heart Problems Father   . Cancer Brother        prostate  . Heart Problems Brother    Past Surgical History:  Procedure Laterality Date  . BACK SURGERY    . CHOLECYSTECTOMY    . KNEE ARTHROSCOPY  06/07/2017  . LAPAROSCOPIC  UNILATERAL SALPINGO OOPHERECTOMY    . TUBAL LIGATION     Social History   Social History Narrative  . Not on file    Objective: Vital Signs: BP 118/74 (BP Location: Left Arm, Patient Position: Sitting, Cuff Size: Normal)   Pulse 76   Resp 14   Ht 5\' 8"  (1.727 m)   Wt 180 lb (81.6 kg)   BMI 27.37 kg/m    Physical Exam  Constitutional: She is oriented to person, place, and  time. She appears well-developed and well-nourished.  HENT:  Head: Normocephalic and atraumatic.  Eyes: Conjunctivae and EOM are normal.  Neck: Normal range of motion.  Cardiovascular: Normal rate, regular rhythm, normal heart sounds and intact distal pulses.  Pulmonary/Chest: Effort normal and breath sounds normal.  Abdominal: Soft. Bowel sounds are normal.  Lymphadenopathy:    She has no cervical adenopathy.  Neurological: She is alert and oriented to person, place, and time.  Skin: Skin is warm and dry. Capillary refill takes less than 2 seconds.  Psychiatric: She has a normal mood and affect. Her behavior is normal.  Nursing note and vitals reviewed.    Musculoskeletal Exam: C-spine, thoracic and lumbar spine good range of motion.  No midline spinal tenderness.  No SI joint tenderness.  Shoulder joints, elbow joints, wrist joints, MCPs, PIPs, DIPs good range of motion no synovitis.  She has full range of motion of bilateral hips with some discomfort.  5 out of 5 muscle strength of upper and lower extremities.  She has difficulty rising from a chair. Knee joints, ankle joints, MTPs, PIPs, DIPs good range of motion no synovitis.  No warmth or effusion of bilateral knee joints.  CDAI Exam: CDAI Homunculus Exam:   Tenderness:  RLE: acetabulofemoral LLE: acetabulofemoral  Joint Counts:  CDAI Tender Joint count: 0 CDAI Swollen Joint count: 0  Global Assessments:  Patient Global Assessment: 2 Provider Global Assessment: 2  CDAI Calculated Score: 4   Investigation: No additional findings.  Imaging: No results found.  Recent Labs: Lab Results  Component Value Date   WBC 13.7 (H) 08/18/2017   HGB 13.3 08/18/2017   PLT 284 08/18/2017   NA 134 (L) 08/16/2017   K 3.6 08/16/2017   CL 96 (L) 08/16/2017   CO2 28 08/16/2017   GLUCOSE 113 (H) 08/16/2017   BUN 13 08/16/2017   CREATININE 0.76 08/16/2017   BILITOT 0.4 08/16/2017   ALKPHOS 150 (H) 05/09/2014   AST 17 08/16/2017    ALT 12 08/16/2017   PROT 6.2 08/16/2017   PROT 6.3 08/16/2017   ALBUMIN 3.3 (L) 05/09/2014   CALCIUM 9.1 08/16/2017   GFRAA 88 08/16/2017   QFTBGOLDPLUS NEGATIVE 08/16/2017    Speciality Comments: No specialty comments available.  Procedures:  No procedures performed Allergies: Hydroxychloroquine and Other   Assessment / Plan:     Visit Diagnoses: Rheumatoid arthritis of multiple sites with negative rheumatoid factor ([place]) with positive CCP.  She has no synovitis on examination today.  She states she felt past while she was on Plaquenil in the past.  Plaquenil was discontinued due to severe rash.  She was hospitalized and treated with prednisone.  She has no synovitis on examination today.  Her main concern is pain in bilateral hip joints.  She has difficulty getting out of the car and getting up from the chair.  I believe some of her symptoms are coming from underlying osteoarthritis.  We had detailed discussion with her regarding different treatment options.  I  offered cortisone injection which she declined as she is diabetic.  After reviewing indications side effects and contraindications we discussed possible use of meloxicam.  She is willing to start on meloxicam.  I will start her on meloxicam 7.5 mg p.o. daily.  I will check her labs in 1 month.  She may take meloxicam later on on PRN basis.  High risk medication use - d/c PLQ 6/3/19finished prednisone at the end of June 2019  Rash and other nonspecific skin eruption - She saw the dermatologist, Dr. Brayton El, at [facility] on 09/13/2017.  She continues to have diffuse xerosis.  She has been using CeraVe daily.  Her rash has resolved now.  Positive ANA (antinuclear antibody)  Bilateral hip pain-she has chronic pain in her bilateral hips most likely due to osteoarthritis.  Her x-rays in the past showed mild osteoarthritic changes.  Fibromyalgia-she has some generalized pain and discomfort from fibromyalgia.  Primary osteoarthritis of both  hands-she has DIP and PIP thickening without any synovitis.  Primary osteoarthritis of both knees-she is currently not having much discomfort in her knee joints.  DDD (degenerative disc disease), cervical-minimal stiffness  DDD (degenerative disc disease), lumbar-she has fairly good range of motion of her lumbar spine without discomfort.  History of pancreatitis  History of hypercholesterolemia  History of diabetes mellitus  History of hypothyroidism  Other fatigue   Orders: No orders of the defined types were placed in this encounter.  No orders of the defined types were placed in this encounter.   Face-to-face time spent with patient was 30 minutes. Greater than 50% of time was spent in counseling and coordination of care.  Follow-Up Instructions: Return for Rheumatoid arthritis, Osteoarthritis, DDD.   Bo Merino, MD  Note - This record has been created using Editor, commissioning.  Chart creation errors have been sought, but may not always  have been located. Such creation errors do not reflect on  the standard of medical care.

## 2017-11-02 DIAGNOSIS — Z800 Family history of malignant neoplasm of digestive organs: Secondary | ICD-10-CM | POA: Diagnosis not present

## 2017-11-02 DIAGNOSIS — K640 First degree hemorrhoids: Secondary | ICD-10-CM | POA: Diagnosis not present

## 2017-11-02 DIAGNOSIS — K5730 Diverticulosis of large intestine without perforation or abscess without bleeding: Secondary | ICD-10-CM | POA: Diagnosis not present

## 2017-11-05 DIAGNOSIS — K640 First degree hemorrhoids: Secondary | ICD-10-CM | POA: Diagnosis not present

## 2017-11-05 DIAGNOSIS — Z800 Family history of malignant neoplasm of digestive organs: Secondary | ICD-10-CM | POA: Diagnosis not present

## 2017-11-08 NOTE — Progress Notes (Signed)
Office Visit Note  Patient: Ashley Perkins             Date of Birth: 1940-07-15           MRN: [id number]             PCP: Gregor Hams, FNP Referring: Tobie Lords D, FNP Visit Date: 11/10/2017 Occupation: @GUAROCC @  Subjective:  Muscle tenderness   History of Present Illness: Ashley Perkins is a 77 y.o. female with history of seronegative rheumatoid arthritis, osteoarthritis, fibromyalgia, and DDD.  Patient reports that since her last visit on 10/21/2017 she has been taking meloxicam daily and is noticed increased bruising on her bilateral arms.  She states that she has noticed very minimal benefit since starting on meloxicam.  She states that she continues to have discomfort after sitting for long peers of time and trying to get up.  She states that she is having a deep soreness in bilateral thighs worse on the right side.  She states that time she has difficulty lifting her leg.  She denies any muscle weakness.  She states that she is also been having increased discomfort in her right knee joint in hopes that she does not have any replacement soon.  She denies any joint swelling.  She denies any other joint pain at this time.  She denies any neck pain or lower back pain at this time.  She states that she continues to have muscle tenderness in bilateral upper extremities.  She states that she will be starting back at water aerobics on September 10 and thinks that this will help with her generalized pain.   Activities of Daily Living:  Patient reports morning stiffness for 0  minutes.   Patient Denies nocturnal pain.  Difficulty dressing/grooming: Denies Difficulty climbing stairs: Denies Difficulty getting out of chair: Reports Difficulty using hands for taps, buttons, cutlery, and/or writing: Denies  Review of Systems  Constitutional: Negative for fatigue.  HENT: Negative for mouth sores, mouth dryness and nose dryness.   Eyes: Negative for pain, visual disturbance and  dryness.  Respiratory: Negative for cough, hemoptysis, shortness of breath and difficulty breathing.   Cardiovascular: Negative for chest pain, palpitations, hypertension and swelling in legs/feet.  Gastrointestinal: Positive for constipation. Negative for blood in stool and diarrhea.  Endocrine: Negative for increased urination.  Genitourinary: Negative for painful urination.  Musculoskeletal: Positive for myalgias, morning stiffness, muscle tenderness and myalgias. Negative for arthralgias, joint pain, joint swelling and muscle weakness.  Skin: Negative for color change, pallor, rash, hair loss, nodules/bumps, skin tightness, ulcers and sensitivity to sunlight.  Allergic/Immunologic: Negative for susceptible to infections.  Neurological: Negative for dizziness, numbness, headaches and weakness.  Hematological: Positive for bruising/bleeding tendency. Negative for swollen glands.  Psychiatric/Behavioral: Negative for depressed mood and sleep disturbance. The patient is not nervous/anxious.     PMFS History:  Patient Active Problem List   Diagnosis Date Noted  . Fibromyalgia 07/15/2017  . Primary osteoarthritis of both hands 07/15/2017  . Primary osteoarthritis of both knees 07/15/2017  . DDD (degenerative disc disease), cervical 07/15/2017  . DDD (degenerative disc disease), lumbar 07/15/2017  . History of knee surgery 07/15/2017  . Elevated liver function tests   . Generalized abdominal pain   . Depression   . Acute pancreatitis 05/07/2014  . Transaminitis 05/07/2014  . DM2 (diabetes mellitus, type 2) ([place]) 05/07/2014    Past Medical History:  Diagnosis Date  . Diabetes mellitus without complication ([place])   . Fibromyalgia   .  Hypercholesteremia     Family History  Problem Relation Age of Onset  . Dementia Mother   . Diabetes Father   . Cancer Father   . Heart Problems Father   . Cancer Brother        prostate  . Heart Problems Brother    Past Surgical History:    Procedure Laterality Date  . BACK SURGERY    . CHOLECYSTECTOMY    . KNEE ARTHROSCOPY  06/07/2017  . LAPAROSCOPIC UNILATERAL SALPINGO OOPHERECTOMY    . TUBAL LIGATION     Social History   Social History Narrative  . Not on file    Objective: Vital Signs: BP 112/72 (BP Location: Left Arm, Patient Position: Sitting, Cuff Size: Normal)   Pulse 83   Resp 15   Ht 5\' 8"  (1.727 m)   Wt 179 lb 3.2 oz (81.3 kg)   BMI 27.25 kg/m    Physical Exam  Constitutional: She is oriented to person, place, and time. She appears well-developed and well-nourished.  HENT:  Head: Normocephalic and atraumatic.  Eyes: Conjunctivae and EOM are normal.  Neck: Normal range of motion.  Cardiovascular: Normal rate, regular rhythm, normal heart sounds and intact distal pulses.  Pulmonary/Chest: Effort normal and breath sounds normal.  Abdominal: Soft. Bowel sounds are normal.  Lymphadenopathy:    She has no cervical adenopathy.  Neurological: She is alert and oriented to person, place, and time.  Skin: Skin is warm and dry. Capillary refill takes less than 2 seconds.  Psychiatric: She has a normal mood and affect. Her behavior is normal.  Nursing note and vitals reviewed.    Musculoskeletal Exam: C-spine, thoracic spine, lumbar spine good range of motion.  No midline spinal tenderness.  No SI joint tenderness.  Shoulder joints, elbow joints, wrist joints, MCPs, PIPs, DIPs good range of motion with no synovitis.  She has complete fist formation bilaterally.  No tenderness of MCP joints on exam.  She is synovial thickening of bilateral CMC joints.  She is PIP and DIP synovial thickening consistent with also arthritis of bilateral hands.  Hip joints, knee joints, ankle joints, MTPs, PIPs, DIPs good range of motion with no synovitis.  No warmth or effusion of bilateral knee joints.  She has bilateral knee crepitus.  No tenderness of trochanteric bursa on exam.  She is no discomfort with hip range of motion.  No  Achilles tendinitis or plantar fasciitis.  CDAI Exam: CDAI Score: Not documented Patient Global Assessment: Not documented; Provider Global Assessment: Not documented Swollen: Not documented; Tender: Not documented Joint Exam   Not documented   There is currently no information documented on the homunculus. Go to the Rheumatology activity and complete the homunculus joint exam.  Investigation: No additional findings.  Imaging: No results found.  Recent Labs: Lab Results  Component Value Date   WBC 13.7 (H) 08/18/2017   HGB 13.3 08/18/2017   PLT 284 08/18/2017   NA 134 (L) 08/16/2017   K 3.6 08/16/2017   CL 96 (L) 08/16/2017   CO2 28 08/16/2017   GLUCOSE 113 (H) 08/16/2017   BUN 13 08/16/2017   CREATININE 0.76 08/16/2017   BILITOT 0.4 08/16/2017   ALKPHOS 150 (H) 05/09/2014   AST 17 08/16/2017   ALT 12 08/16/2017   PROT 6.2 08/16/2017   PROT 6.3 08/16/2017   ALBUMIN 3.3 (L) 05/09/2014   CALCIUM 9.1 08/16/2017   GFRAA 88 08/16/2017   QFTBGOLDPLUS NEGATIVE 08/16/2017    Speciality Comments: No specialty comments  available.  Procedures:  No procedures performed Allergies: Hydroxychloroquine and Other   Assessment / Plan:     Visit Diagnoses: Rheumatoid arthritis of multiple sites with negative rheumatoid factor ([place]) - with positive CCP: She has no synovitis on exam.  She has not had any recent rheumatoid arthritis flares.  She has been taking meloxicam for pain relief.  She has noticed increased bruising on her forearms since starting on meloxicam.  She can try taking Tylenol as needed if she does not want to continue taking Meloxicam. Since she is clinically doing well there is no need to start methotrexate at this time.  She is advised to notify us if she notices increased joint swelling.  She will follow-up in the office in 5 months.  High risk medication use - d/c PLQ 08/16/17  Rash and other nonspecific skin eruption - She saw the dermatologist, Dr. Brayton El, at  [facility] on 09/13/2017.  She continues to have diffuse xerosis.  Positive ANA (antinuclear antibody)  Fibromyalgia: She continues to have generalized muscle aches muscle tenderness due to fibromyalgia.  She has been having increased muscle tenderness in her lower extremity especially the right leg.  She is excited to start back going to water aerobics in November 23, 2017.  She has been having increased discomfort since she is been more sedentary lately.  She has been sleeping well at night.  She denies any worsening fatigue.  She is encouraged to get back to exercising on a regular basis.  She continues to see pain management.  Primary osteoarthritis of both hands: She has PIP and DIP synovial thickening consistent with osteoarthritis of bilateral hands.  Bilateral CMC joint synovial thickening noted.  She is complete fist formation bilaterally.  She is no synovitis on exam.  Joint protection and muscle strengthening were discussed.  Primary osteoarthritis of both knees: No warmth or effusion.  She is been having increased discomfort in her right knee joint.  She has bilateral knee crepitus.  She is hesitant to proceed with a right knee replacement.  DDD (degenerative disc disease), cervical: She has good range of motion with no discomfort.  He has no symptoms of radiculopathy at this time.  DDD (degenerative disc disease), lumbar: She has no midline spinal tenderness or discomfort at this time.  Other fatigue  Other medical conditions are listed as follows:  History of diabetes mellitus  History of pancreatitis  History of hypercholesterolemia  History of hypothyroidism   Orders: No orders of the defined types were placed in this encounter.  No orders of the defined types were placed in this encounter.   Face-to-face time spent with patient was 30 minutes. Greater than 50% of time was spent in counseling and coordination of care.  Follow-Up Instructions: Return in about 5 months (around  04/12/2018) for Rheumatoid arthritis, DDD, Osteoarthritis.   Ofilia Neas, PA-C   I examined and evaluated the patient with Hazel Sams PA.  Patient had mild bruising from use of NSAIDs.  I have advised her to take NSAIDs only on PRN basis.  Use of Tylenol was encouraged and stressed.  The plan of care was discussed as noted above.  Bo Merino, MD  Note - This record has been created using Editor, commissioning.  Chart creation errors have been sought, but may not always  have been located. Such creation errors do not reflect on  the standard of medical care.

## 2017-11-10 ENCOUNTER — Ambulatory Visit: Payer: Medicare HMO | Admitting: Physician Assistant

## 2017-11-10 ENCOUNTER — Encounter: Payer: Self-pay | Admitting: Physician Assistant

## 2017-11-10 VITALS — BP 112/72 | HR 83 | Resp 15 | Ht 68.0 in | Wt 179.2 lb

## 2017-11-10 DIAGNOSIS — M19042 Primary osteoarthritis, left hand: Secondary | ICD-10-CM

## 2017-11-10 DIAGNOSIS — M5136 Other intervertebral disc degeneration, lumbar region: Secondary | ICD-10-CM

## 2017-11-10 DIAGNOSIS — R768 Other specified abnormal immunological findings in serum: Secondary | ICD-10-CM | POA: Diagnosis not present

## 2017-11-10 DIAGNOSIS — Z8719 Personal history of other diseases of the digestive system: Secondary | ICD-10-CM

## 2017-11-10 DIAGNOSIS — M0609 Rheumatoid arthritis without rheumatoid factor, multiple sites: Secondary | ICD-10-CM

## 2017-11-10 DIAGNOSIS — R21 Rash and other nonspecific skin eruption: Secondary | ICD-10-CM

## 2017-11-10 DIAGNOSIS — M5030 Other cervical disc degeneration, unspecified cervical region: Secondary | ICD-10-CM

## 2017-11-10 DIAGNOSIS — Z79899 Other long term (current) drug therapy: Secondary | ICD-10-CM

## 2017-11-10 DIAGNOSIS — M19041 Primary osteoarthritis, right hand: Secondary | ICD-10-CM | POA: Diagnosis not present

## 2017-11-10 DIAGNOSIS — R5383 Other fatigue: Secondary | ICD-10-CM

## 2017-11-10 DIAGNOSIS — M797 Fibromyalgia: Secondary | ICD-10-CM | POA: Diagnosis not present

## 2017-11-10 DIAGNOSIS — M170 Bilateral primary osteoarthritis of knee: Secondary | ICD-10-CM

## 2017-11-10 DIAGNOSIS — Z8639 Personal history of other endocrine, nutritional and metabolic disease: Secondary | ICD-10-CM

## 2017-11-17 ENCOUNTER — Encounter (HOSPITAL_COMMUNITY): Payer: Self-pay | Admitting: Emergency Medicine

## 2017-11-17 ENCOUNTER — Emergency Department (HOSPITAL_COMMUNITY)
Admission: EM | Admit: 2017-11-17 | Discharge: 2017-11-17 | Disposition: A | Discharge status: Home / Self Care | Payer: Medicare HMO | Attending: Emergency Medicine | Admitting: Emergency Medicine

## 2017-11-17 ENCOUNTER — Emergency Department (HOSPITAL_COMMUNITY): Payer: Medicare HMO

## 2017-11-17 DIAGNOSIS — R1013 Epigastric pain: Secondary | ICD-10-CM | POA: Diagnosis present

## 2017-11-17 DIAGNOSIS — K859 Acute pancreatitis without necrosis or infection, unspecified: Secondary | ICD-10-CM | POA: Diagnosis not present

## 2017-11-17 DIAGNOSIS — E119 Type 2 diabetes mellitus without complications: Secondary | ICD-10-CM | POA: Insufficient documentation

## 2017-11-17 DIAGNOSIS — Z7984 Long term (current) use of oral hypoglycemic drugs: Secondary | ICD-10-CM | POA: Diagnosis not present

## 2017-11-17 DIAGNOSIS — K5730 Diverticulosis of large intestine without perforation or abscess without bleeding: Secondary | ICD-10-CM | POA: Diagnosis not present

## 2017-11-17 LAB — URINALYSIS, ROUTINE W REFLEX MICROSCOPIC
Bilirubin Urine: NEGATIVE
Glucose, UA: 50 mg/dL — AB
HGB URINE DIPSTICK: NEGATIVE
KETONES UR: 5 mg/dL — AB
Leukocytes, UA: NEGATIVE
Nitrite: NEGATIVE
Protein, ur: NEGATIVE mg/dL
Specific Gravity, Urine: 1.006 (ref 1.005–1.030)
pH: 9 — ABNORMAL HIGH (ref 5.0–8.0)

## 2017-11-17 LAB — COMPREHENSIVE METABOLIC PANEL
ALBUMIN: 3.9 g/dL (ref 3.5–5.0)
ALT: 32 U/L (ref 0–44)
AST: 89 U/L — ABNORMAL HIGH (ref 15–41)
Alkaline Phosphatase: 80 U/L (ref 38–126)
Anion gap: 13 (ref 5–15)
BUN: 14 mg/dL (ref 8–23)
CHLORIDE: 100 mmol/L (ref 98–111)
CO2: 23 mmol/L (ref 22–32)
Calcium: 9.3 mg/dL (ref 8.9–10.3)
Creatinine, Ser: 0.76 mg/dL (ref 0.44–1.00)
GFR calc non Af Amer: >60 mL/min (ref >60)
GLUCOSE: 165 mg/dL — AB (ref 70–99)
POTASSIUM: 3.8 mmol/L (ref 3.5–5.1)
SODIUM: 136 mmol/L (ref 135–145)
Total Bilirubin: 1.1 mg/dL (ref 0.3–1.2)
Total Protein: 6.6 g/dL (ref 6.5–8.1)

## 2017-11-17 LAB — CBC
HCT: 40.4 % (ref 36.0–46.0)
Hemoglobin: 12.8 g/dL (ref 12.0–15.0)
MCH: 30 pg (ref 26.0–34.0)
MCHC: 31.7 g/dL (ref 30.0–36.0)
MCV: 94.6 fL (ref 78.0–100.0)
PLATELETS: 273 10*3/uL (ref 150–400)
RBC: 4.27 MIL/uL (ref 3.87–5.11)
RDW: 13.4 % (ref 11.5–15.5)
WBC: 8 10*3/uL (ref 4.0–10.5)

## 2017-11-17 LAB — LIPASE, BLOOD: LIPASE: 64 U/L — AB (ref 11–51)

## 2017-11-17 MED ORDER — SODIUM CHLORIDE 0.9 % IV BOLUS
1000.0000 mL | Freq: Once | INTRAVENOUS | Status: AC
  Administered 2017-11-17: 1000 mL via INTRAVENOUS

## 2017-11-17 MED ORDER — IOPAMIDOL (ISOVUE-300) INJECTION 61%
INTRAVENOUS | Status: AC
  Filled 2017-11-17: qty 100

## 2017-11-17 MED ORDER — IOPAMIDOL (ISOVUE-300) INJECTION 61%
100.0000 mL | Freq: Once | INTRAVENOUS | Status: AC | PRN
  Administered 2017-11-17: 100 mL via INTRAVENOUS

## 2017-11-17 MED ORDER — ONDANSETRON 4 MG PO TBDP
4.0000 mg | ORAL_TABLET | Freq: Once | ORAL | Status: AC
  Administered 2017-11-17: 4 mg via ORAL
  Filled 2017-11-17: qty 1

## 2017-11-17 MED ORDER — ONDANSETRON HCL 4 MG/2ML IJ SOLN
4.0000 mg | Freq: Once | INTRAMUSCULAR | Status: AC
  Administered 2017-11-17: 4 mg via INTRAVENOUS
  Filled 2017-11-17: qty 2

## 2017-11-17 MED ORDER — ONDANSETRON 4 MG PO TBDP: ORAL_TABLET | ORAL | 0 refills | Status: DC

## 2017-11-17 MED ORDER — MORPHINE SULFATE (PF) 4 MG/ML IV SOLN
4.0000 mg | Freq: Once | INTRAVENOUS | Status: AC
  Administered 2017-11-17: 4 mg via INTRAVENOUS
  Filled 2017-11-17: qty 1

## 2017-11-17 NOTE — ED Provider Notes (Signed)
[place] EMERGENCY DEPARTMENT Provider Note   CSN: [id number] Arrival date & time: 11/17/17  0341     History   Chief Complaint Chief Complaint  Patient presents with  . Abdominal Pain    HPI Ashley Perkins is a 77 y.o. female.  HPI  This 77 year old female with a history of diabetes, fibromyalgia, pancreatitis who presents with abdominal pain.  Patient reports onset of abdominal pain and cramping started this evening.  Does not seem to get better or worse with anything.  She did take half of a pain pill which did not seem to help much.  Currently she rates her pain at 6 out of 10.  She reports nausea without vomiting.  At baseline she has constipation.  She denies any fevers, chest pain, shortness of breath.  She has a history of pancreatitis with similar symptoms in the past.  Unknown etiology of her pancreatitis.  She does report that she had a scan at [facility] which "showed 2 small masses on her pancreas."  She is having these followed.  Past Medical History:  Diagnosis Date  . Diabetes mellitus without complication ([place])   . Fibromyalgia   . Hypercholesteremia     Patient Active Problem List   Diagnosis Date Noted  . Fibromyalgia 07/15/2017  . Primary osteoarthritis of both hands 07/15/2017  . Primary osteoarthritis of both knees 07/15/2017  . DDD (degenerative disc disease), cervical 07/15/2017  . DDD (degenerative disc disease), lumbar 07/15/2017  . History of knee surgery 07/15/2017  . Elevated liver function tests   . Generalized abdominal pain   . Depression   . Acute pancreatitis 05/07/2014  . Transaminitis 05/07/2014  . DM2 (diabetes mellitus, type 2) ([place]) 05/07/2014    Past Surgical History:  Procedure Laterality Date  . BACK SURGERY    . CHOLECYSTECTOMY    . KNEE ARTHROSCOPY  06/07/2017  . LAPAROSCOPIC UNILATERAL SALPINGO OOPHERECTOMY    . TUBAL LIGATION       OB History   None      Home Medications    Prior to Admission  medications   Medication Sig Start Date End Date Taking? Authorizing Provider  acetaminophen (TYLENOL) 650 MG CR tablet Take 650 mg by mouth every 8 (eight) hours as needed for pain.   Yes [provider]  amitriptyline (ELAVIL) 75 MG tablet Take 75 mg by mouth at bedtime.   Yes [provider]  B Complex Vitamins (VITAMIN B COMPLEX PO) Take 1 tablet by mouth daily.   Yes [provider]  glimepiride (AMARYL) 2 MG tablet Take 2 mg by mouth daily with breakfast.  11/05/16  Yes [provider]  levothyroxine (SYNTHROID, LEVOTHROID) 88 MCG tablet Take 88 mcg by mouth daily before breakfast. 02/13/14  Yes [provider]  meloxicam (MOBIC) 7.5 MG tablet Take 1 tablet (7.5 mg total) by mouth daily. 10/18/17  Yes Deveshwar, Abel Presto, MD  metFORMIN (GLUCOPHAGE) 500 MG tablet Take 1,000 mg by mouth 2 (two) times daily with a meal.    Yes [provider]  morphine (MS CONTIN) 15 MG 12 hr tablet Take 7.5-15 mg by mouth every 12 (twelve) hours.   Yes [provider]  Multiple Vitamin (MULTIVITAMIN WITH MINERALS) TABS Take 1 tablet by mouth daily.   Yes [provider]  pioglitazone (ACTOS) 15 MG tablet Take 15 mg by mouth daily.  11/05/16  Yes [provider]    Family History Family History  Problem Relation Age of  Onset  . Dementia Mother   . Diabetes Father   . Cancer Father   . Heart Problems Father   . Cancer Brother        prostate  . Heart Problems Brother     Social History Social History   Tobacco Use  . Smoking status: Never Smoker  . Smokeless tobacco: Never Used  Substance Use Topics  . Alcohol use: No  . Drug use: No     Allergies   Hydroxychloroquine   Review of Systems Review of Systems  Constitutional: Negative for fever.  Respiratory: Negative for shortness of breath.   Cardiovascular: Negative for chest pain.  Gastrointestinal: Positive for abdominal pain, constipation and nausea. Negative  for diarrhea and vomiting.  Genitourinary: Negative for dysuria.  Musculoskeletal: Negative for back pain.  Neurological: Negative for headaches.  All other systems reviewed and are negative.    Physical Exam Updated Vital Signs BP 139/71   Pulse 92   Temp 97.7 F (36.5 C) (Oral)   Resp 15   Ht 1.727 m (5\' 8" )   Wt 79.4 kg   SpO2 100%   BMI 26.61 kg/m   Physical Exam  Constitutional: She is oriented to person, place, and time.  Elderly, nontoxic-appearing, no acute distress  HENT:  Head: Normocephalic and atraumatic.  Mucous membranes dry  Eyes: Pupils are equal, round, and reactive to light.  Neck: Neck supple.  Cardiovascular: Normal rate, regular rhythm and normal heart sounds.  Pulmonary/Chest: Effort normal. No respiratory distress. She has no wheezes.  Abdominal: Soft. Bowel sounds are normal. There is tenderness in the epigastric area. There is no rebound and no guarding.  Neurological: She is alert and oriented to person, place, and time.  Skin: Skin is warm and dry.  Psychiatric: She has a normal mood and affect.  Nursing note and vitals reviewed.    ED Treatments / Results  Labs (all labs ordered are listed, but only abnormal results are displayed) Labs Reviewed  LIPASE, BLOOD - Abnormal; Notable for the following components:      Result Value   Lipase 64 (*)    All other components within normal limits  COMPREHENSIVE METABOLIC PANEL - Abnormal; Notable for the following components:   Glucose, Bld 165 (*)    AST 89 (*)    All other components within normal limits  URINALYSIS, ROUTINE W REFLEX MICROSCOPIC - Abnormal; Notable for the following components:   Color, Urine COLORLESS (*)    pH 9.0 (*)    Glucose, UA 50 (*)    Ketones, ur 5 (*)    All other components within normal limits  CBC    EKG None  Radiology Ct Abdomen Pelvis W Contrast  Result Date: 11/17/2017 CLINICAL DATA:  Epigastric region pain and nausea. EXAM: CT ABDOMEN AND PELVIS  WITH CONTRAST TECHNIQUE: Multidetector CT imaging of the abdomen and pelvis was performed using the standard protocol following bolus administration of intravenous contrast. CONTRAST:  100 mL ISOVUE-300 IOPAMIDOL (ISOVUE-300) INJECTION 61%, COMPARISON:  May 07, 2014 FINDINGS: Lower chest: There is mild bibasilar atelectatic change. No lung base edema or consolidation. Hepatobiliary: No focal liver lesions are evident. Gallbladder is absent. There is mild biliary duct dilatation. The common bile duct measures 12 mm proximally with tapering distally. No biliary duct mass or calculus is evident. Pancreas: There is slight decreased attenuation in the head and portions of the body of the pancreas compared to the remainder of the pancreas. There is no well-defined pancreatic  mass or duct dilatation. No peripancreatic fluid evident. Spleen: No splenic lesions are evident. There is a small accessory spleen anteriorly. Adrenals/Urinary Tract: Adrenals bilaterally appear unremarkable. Kidneys bilaterally show no evident mass or hydronephrosis on either side. There is no evident renal or ureteral calculus on either side. Urinary bladder is distended without wall thickening. Stomach/Bowel: There are sigmoid diverticula without diverticulitis. There is no appreciable bowel wall or mesenteric thickening. There is moderate stool throughout the colon. There is no evident bowel obstruction. No free air or portal venous air. Vascular/Lymphatic: The aorta is somewhat tortuous with mild atherosclerotic calcification. No aneurysm. Major mesenteric arterial vessels appear patent. No adenopathy is evident in the abdomen or pelvis. Reproductive: Uterus is anteverted.  No evident pelvic mass. Other: Appendix appears normal. No evident abscess or ascites in the abdomen or pelvis. Musculoskeletal: There is degenerative change at L5-S1. There are no blastic or lytic bone lesions. There is a probable small bone island in the right  acetabular region. No intramuscular or abdominal wall lesions are evident. IMPRESSION: 1. There is somewhat decreased attenuation involving the head and portions of the body of the pancreas, possibly due to a degree of pancreatitis. There is no well-defined mass or pancreatic duct dilatation. No peripancreatic fluid or pseudocyst. This finding may warrant nonemergent pancreatic MR to further assess. 2. Absent gallbladder with intrahepatic and extrahepatic biliary duct dilatation. No biliary duct mass or calculus evident. 3. No evident bowel obstruction. There are sigmoid diverticula without diverticulitis. No abscess in the abdomen pelvis. Appendix appears normal. 4.  No evident renal or ureteral calculus.  No hydronephrosis. 5.  Mild aortic atherosclerosis. Aortic Atherosclerosis (ICD10-I70.0). Electronically Signed   By: Lowella Grip III M.D.   On: 11/17/2017 07:39    Procedures Procedures (including critical care time)  Medications Ordered in ED Medications  iopamidol (ISOVUE-300) 61 % injection (has no administration in time range)  sodium chloride 0.9 % bolus 1,000 mL (has no administration in time range)  sodium chloride 0.9 % bolus 1,000 mL (0 mLs Intravenous Stopped 11/17/17 0727)  morphine 4 MG/ML injection 4 mg (4 mg Intravenous Given 11/17/17 0549)  ondansetron (ZOFRAN) injection 4 mg (4 mg Intravenous Given 11/17/17 0547)  iopamidol (ISOVUE-300) 61 % injection 100 mL (100 mLs Intravenous Contrast Given 11/17/17 0653)     Initial Impression / Assessment and Plan / ED Course  I have reviewed the triage vital signs and the nursing notes.  Pertinent labs & imaging results that were available during my care of the patient were reviewed by me and considered in my medical decision making (see chart for details).     Patient presents with abdominal pain.  Reports history of pancreatitis.  She is overall nontoxic-appearing.  She does clinically appear dry.  She has some tenderness without  signs of peritonitis on exam.  She is otherwise nontoxic-appearing and vital signs are reassuring.  Lab work-up is largely reassuring.  Lipase is only marginally elevated at 64.  Patient was given fluids and pain medication.  CT scan obtained.  CT shows some decreased attenuation of the pancreatic head which could reflect early appendicitis.  No obvious masses.  Patient has close follow-up and reports that she is to be reevaluated within the year for her abnormalities on her prior CT.  We will have her follow-up with her primary for this.  Patient states she feels somewhat better.  She clinically still appears dry.  She is given 1 additional liter of fluids.  She would like  to trial going home with pain control and a clear liquid diet.  Suspect she may have early pancreatitis.  She has morphine at home.  Final Clinical Impressions(s) / ED Diagnoses   Final diagnoses:  Acute pancreatitis, unspecified complication status, unspecified pancreatitis type    ED Discharge Orders    None       Merryl Hacker, MD 11/17/17 [phone]

## 2017-11-17 NOTE — ED Provider Notes (Signed)
Pain is controlled.  Tolerating oral intake.  Return precautions given.   Julianne Rice, MD 11/17/17 1135

## 2017-11-17 NOTE — ED Notes (Signed)
Pt states nausea has resolved denies pain.

## 2017-11-17 NOTE — Discharge Instructions (Signed)
Pain medications at home as already prescribed.  Make sure to stay hydrated.  Stick with a clear diet until symptoms are improving.  If you have worsening pain, nausea, vomiting, any new or worsening symptoms you should be reevaluated.

## 2017-11-17 NOTE — ED Triage Notes (Signed)
Pt reports sudden onset of upper abd pain cramping pain.  Pt reports beginnings of nausea, reports some constipation.  Pt reports pain is 7 out of 10.

## 2017-11-17 NOTE — ED Notes (Signed)
Pt takinig po fluids and tolerating well.

## 2017-11-17 NOTE — ED Notes (Signed)
After drinking fluids, Pt request medication for nausea.

## 2017-11-24 DIAGNOSIS — M2550 Pain in unspecified joint: Secondary | ICD-10-CM | POA: Diagnosis not present

## 2017-11-24 DIAGNOSIS — G894 Chronic pain syndrome: Secondary | ICD-10-CM | POA: Diagnosis not present

## 2017-11-24 DIAGNOSIS — M797 Fibromyalgia: Secondary | ICD-10-CM | POA: Diagnosis not present

## 2017-11-24 DIAGNOSIS — M069 Rheumatoid arthritis, unspecified: Secondary | ICD-10-CM | POA: Diagnosis not present

## 2017-11-26 ENCOUNTER — Telehealth: Payer: Self-pay | Admitting: Rheumatology

## 2017-11-26 NOTE — Progress Notes (Deleted)
Office Visit Note  Patient: Ashley Perkins             Date of Birth: 1940/03/27           MRN: [id number]             PCP: Gregor Hams, FNP Referring: Gregor Hams, FNP Visit Date: 11/30/2017 Occupation: @GUAROCC @  Subjective:  No chief complaint on file.   History of Present Illness: Ashley Perkins is a 77 y.o. female ***   Activities of Daily Living:  Patient reports morning stiffness for *** {minute/hour:19697}.   Patient {ACTIONS;DENIES/REPORTS:21021675::"Denies"} nocturnal pain.  Difficulty dressing/grooming: {ACTIONS;DENIES/REPORTS:21021675::"Denies"} Difficulty climbing stairs: {ACTIONS;DENIES/REPORTS:21021675::"Denies"} Difficulty getting out of chair: {ACTIONS;DENIES/REPORTS:21021675::"Denies"} Difficulty using hands for taps, buttons, cutlery, and/or writing: {ACTIONS;DENIES/REPORTS:21021675::"Denies"}  No Rheumatology ROS completed.   PMFS History:  Patient Active Problem List   Diagnosis Date Noted  . Fibromyalgia 07/15/2017  . Primary osteoarthritis of both hands 07/15/2017  . Primary osteoarthritis of both knees 07/15/2017  . DDD (degenerative disc disease), cervical 07/15/2017  . DDD (degenerative disc disease), lumbar 07/15/2017  . History of knee surgery 07/15/2017  . Elevated liver function tests   . Generalized abdominal pain   . Depression   . Acute pancreatitis 05/07/2014  . Transaminitis 05/07/2014  . DM2 (diabetes mellitus, type 2) ([place]) 05/07/2014    Past Medical History:  Diagnosis Date  . Diabetes mellitus without complication ([place])   . Fibromyalgia   . Hypercholesteremia     Family History  Problem Relation Age of Onset  . Dementia Mother   . Diabetes Father   . Cancer Father   . Heart Problems Father   . Cancer Brother        prostate  . Heart Problems Brother    Past Surgical History:  Procedure Laterality Date  . BACK SURGERY    . CHOLECYSTECTOMY    . KNEE ARTHROSCOPY  06/07/2017  . LAPAROSCOPIC UNILATERAL  SALPINGO OOPHERECTOMY    . TUBAL LIGATION     Social History   Social History Narrative  . Not on file    Objective: Vital Signs: There were no vitals taken for this visit.   Physical Exam   Musculoskeletal Exam: ***  CDAI Exam: CDAI Score: Not documented Patient Global Assessment: Not documented; Provider Global Assessment: Not documented Swollen: Not documented; Tender: Not documented Joint Exam   Not documented   There is currently no information documented on the homunculus. Go to the Rheumatology activity and complete the homunculus joint exam.  Investigation: No additional findings.  Imaging: Ct Abdomen Pelvis W Contrast  Result Date: 11/17/2017 CLINICAL DATA:  Epigastric region pain and nausea. EXAM: CT ABDOMEN AND PELVIS WITH CONTRAST TECHNIQUE: Multidetector CT imaging of the abdomen and pelvis was performed using the standard protocol following bolus administration of intravenous contrast. CONTRAST:  100 mL ISOVUE-300 IOPAMIDOL (ISOVUE-300) INJECTION 61%, COMPARISON:  May 07, 2014 FINDINGS: Lower chest: There is mild bibasilar atelectatic change. No lung base edema or consolidation. Hepatobiliary: No focal liver lesions are evident. Gallbladder is absent. There is mild biliary duct dilatation. The common bile duct measures 12 mm proximally with tapering distally. No biliary duct mass or calculus is evident. Pancreas: There is slight decreased attenuation in the head and portions of the body of the pancreas compared to the remainder of the pancreas. There is no well-defined pancreatic mass or duct dilatation. No peripancreatic fluid evident. Spleen: No splenic lesions are evident. There is a small accessory spleen anteriorly. Adrenals/Urinary Tract:  Adrenals bilaterally appear unremarkable. Kidneys bilaterally show no evident mass or hydronephrosis on either side. There is no evident renal or ureteral calculus on either side. Urinary bladder is distended without wall  thickening. Stomach/Bowel: There are sigmoid diverticula without diverticulitis. There is no appreciable bowel wall or mesenteric thickening. There is moderate stool throughout the colon. There is no evident bowel obstruction. No free air or portal venous air. Vascular/Lymphatic: The aorta is somewhat tortuous with mild atherosclerotic calcification. No aneurysm. Major mesenteric arterial vessels appear patent. No adenopathy is evident in the abdomen or pelvis. Reproductive: Uterus is anteverted.  No evident pelvic mass. Other: Appendix appears normal. No evident abscess or ascites in the abdomen or pelvis. Musculoskeletal: There is degenerative change at L5-S1. There are no blastic or lytic bone lesions. There is a probable small bone island in the right acetabular region. No intramuscular or abdominal wall lesions are evident. IMPRESSION: 1. There is somewhat decreased attenuation involving the head and portions of the body of the pancreas, possibly due to a degree of pancreatitis. There is no well-defined mass or pancreatic duct dilatation. No peripancreatic fluid or pseudocyst. This finding may warrant nonemergent pancreatic MR to further assess. 2. Absent gallbladder with intrahepatic and extrahepatic biliary duct dilatation. No biliary duct mass or calculus evident. 3. No evident bowel obstruction. There are sigmoid diverticula without diverticulitis. No abscess in the abdomen pelvis. Appendix appears normal. 4.  No evident renal or ureteral calculus.  No hydronephrosis. 5.  Mild aortic atherosclerosis. Aortic Atherosclerosis (ICD10-I70.0). Electronically Signed   By: Lowella Grip III M.D.   On: 11/17/2017 07:39    Recent Labs: Lab Results  Component Value Date   WBC 8.0 11/17/2017   HGB 12.8 11/17/2017   PLT 273 11/17/2017   NA 136 11/17/2017   K 3.8 11/17/2017   CL 100 11/17/2017   CO2 23 11/17/2017   GLUCOSE 165 (H) 11/17/2017   BUN 14 11/17/2017   CREATININE 0.76 11/17/2017   BILITOT  1.1 11/17/2017   ALKPHOS 80 11/17/2017   AST 89 (H) 11/17/2017   ALT 32 11/17/2017   PROT 6.6 11/17/2017   ALBUMIN 3.9 11/17/2017   CALCIUM 9.3 11/17/2017   GFRAA >60 11/17/2017   QFTBGOLDPLUS NEGATIVE 08/16/2017    Speciality Comments: No specialty comments available.  Procedures:  No procedures performed Allergies: Hydroxychloroquine   Assessment / Plan:     Visit Diagnoses: No diagnosis found.   Orders: No orders of the defined types were placed in this encounter.  No orders of the defined types were placed in this encounter.   Face-to-face time spent with patient was *** minutes. Greater than 50% of time was spent in counseling and coordination of care.  Follow-Up Instructions: No follow-ups on file.   Earnestine Mealing, CMA  Note - This record has been created using Editor, commissioning.  Chart creation errors have been sought, but may not always  have been located. Such creation errors do not reflect on  the standard of medical care.

## 2017-11-26 NOTE — Telephone Encounter (Signed)
Patient was in the office on 11/10/17. Patient states at that appointment she was advised that Lovena Le did not want to give her anything at this time. Patient states she was advised to take Mobic. Patient states she is not having relief with this medications. Per last office note patient had not had any recent flares and had no synovitis. Patient has been scheduled for 11/30/17.

## 2017-11-26 NOTE — Telephone Encounter (Signed)
Patient calling in reference to pain she is experiencing that has increased significantly in the past two days.  Per patient medication she has is not working. Patient needs something else to help relieve the pain she is having. Per patent, if doctor does not want to put her on something to help with the pain, she wants to be referred to a Rheumatologist that will. Patient uses [facility]. Please call to discuss.

## 2017-11-30 ENCOUNTER — Ambulatory Visit: Payer: Self-pay | Admitting: Rheumatology

## 2017-12-01 ENCOUNTER — Ambulatory Visit: Payer: Medicare HMO | Admitting: Rheumatology

## 2017-12-01 ENCOUNTER — Encounter: Payer: Self-pay | Admitting: Physician Assistant

## 2017-12-01 VITALS — BP 117/74 | HR 93 | Resp 14 | Ht 68.5 in | Wt 176.0 lb

## 2017-12-01 DIAGNOSIS — Z8719 Personal history of other diseases of the digestive system: Secondary | ICD-10-CM

## 2017-12-01 DIAGNOSIS — M5136 Other intervertebral disc degeneration, lumbar region: Secondary | ICD-10-CM

## 2017-12-01 DIAGNOSIS — R5383 Other fatigue: Secondary | ICD-10-CM

## 2017-12-01 DIAGNOSIS — R7689 Other specified abnormal immunological findings in serum: Secondary | ICD-10-CM

## 2017-12-01 DIAGNOSIS — M170 Bilateral primary osteoarthritis of knee: Secondary | ICD-10-CM

## 2017-12-01 DIAGNOSIS — M5030 Other cervical disc degeneration, unspecified cervical region: Secondary | ICD-10-CM

## 2017-12-01 DIAGNOSIS — Z8639 Personal history of other endocrine, nutritional and metabolic disease: Secondary | ICD-10-CM | POA: Diagnosis not present

## 2017-12-01 DIAGNOSIS — M1990 Unspecified osteoarthritis, unspecified site: Secondary | ICD-10-CM | POA: Diagnosis not present

## 2017-12-01 DIAGNOSIS — M19041 Primary osteoarthritis, right hand: Secondary | ICD-10-CM

## 2017-12-01 DIAGNOSIS — R768 Other specified abnormal immunological findings in serum: Secondary | ICD-10-CM | POA: Diagnosis not present

## 2017-12-01 DIAGNOSIS — M797 Fibromyalgia: Secondary | ICD-10-CM | POA: Diagnosis not present

## 2017-12-01 DIAGNOSIS — M51369 Other intervertebral disc degeneration, lumbar region without mention of lumbar back pain or lower extremity pain: Secondary | ICD-10-CM

## 2017-12-01 DIAGNOSIS — M19042 Primary osteoarthritis, left hand: Secondary | ICD-10-CM

## 2017-12-01 DIAGNOSIS — M1380 Other specified arthritis, unspecified site: Secondary | ICD-10-CM

## 2017-12-01 MED ORDER — METHOCARBAMOL 500 MG PO TABS: 500.0000 mg | ORAL_TABLET | Freq: Two times a day (BID) | ORAL | 1 refills | Status: DC | PRN

## 2017-12-01 NOTE — Progress Notes (Signed)
Office Visit Note  Patient: Ashley Perkins             Date of Birth: [birth year]           MRN: [id number]             PCP: Gregor Hams, FNP Referring: Gregor Hams, FNP Visit Date: 12/01/2017 Occupation: @GUAROCC @  Subjective:  Pain in all the muscles.  History of Present Illness: Ashley Perkins is a 77 y.o. female with history of inflammatory arthritis, osteoarthritis, and fibromyalgia.  She discontinued PLQ on 08/16/17 due to rash.  She has had no recurrence of inflammatory arthritis since then.  She states she continues to have some discomfort in bilateral trochanteric bursa.  She has been complaining of pain in her arms and her lower extremities especially over her thighs.  She states she has been going to water aerobics for the last 3 months now.  She continues to have some discomfort in her knee joints due to underlying osteoarthritis without any joint swelling.  She tried Mobic without much relief.  She states her fatigue persist.  She has been also having a flare of fibromyalgia.  She went to the pain management and there was not much to offer.  She states she has been under a lot of stress as her son-in-law is hospitalized.  She was hospitalized to in September for pancreatitis and treated with IV fluids.  She has been experiencing some insomnia due to increased stress.  Activities of Daily Living:  Patient reports morning stiffness for 0  minutes.   Patient Reports nocturnal pain.  Difficulty dressing/grooming: Denies Difficulty climbing stairs: Reports Difficulty getting out of chair: Reports Difficulty using hands for taps, buttons, cutlery, and/or writing: Denies  Review of Systems  Constitutional: Positive for fatigue.  HENT: Negative for mouth sores, mouth dryness and nose dryness.   Eyes: Negative for pain, visual disturbance and dryness.  Respiratory: Negative for cough, hemoptysis, shortness of breath and difficulty breathing.   Cardiovascular: Negative for  chest pain, palpitations, hypertension and swelling in legs/feet.  Gastrointestinal: Negative for blood in stool, constipation and diarrhea.  Endocrine: Negative for increased urination.  Genitourinary: Negative for painful urination.  Musculoskeletal: Positive for arthralgias, joint pain, myalgias, muscle tenderness and myalgias. Negative for joint swelling, muscle weakness and morning stiffness.  Skin: Negative for color change, pallor, rash, hair loss, nodules/bumps, skin tightness, ulcers and sensitivity to sunlight.  Allergic/Immunologic: Negative for susceptible to infections.  Neurological: Negative for dizziness, numbness, headaches and weakness.  Hematological: Negative for bruising/bleeding tendency and swollen glands.  Psychiatric/Behavioral: Positive for sleep disturbance. Negative for depressed mood. The patient is not nervous/anxious.     PMFS History:  Patient Active Problem List   Diagnosis Date Noted  . Fibromyalgia 07/15/2017  . Primary osteoarthritis of both hands 07/15/2017  . Primary osteoarthritis of both knees 07/15/2017  . DDD (degenerative disc disease), cervical 07/15/2017  . DDD (degenerative disc disease), lumbar 07/15/2017  . History of knee surgery 07/15/2017  . Elevated liver function tests   . Generalized abdominal pain   . Depression   . Acute pancreatitis 05/07/2014  . Transaminitis 05/07/2014  . DM2 (diabetes mellitus, type 2) ([place]) 05/07/2014    Past Medical History:  Diagnosis Date  . Diabetes mellitus without complication ([place])   . Fibromyalgia   . Hypercholesteremia     Family History  Problem Relation Age of Onset  . Dementia Mother   . Diabetes Father   .  Cancer Father   . Heart Problems Father   . Cancer Brother        prostate  . Heart Problems Brother    Past Surgical History:  Procedure Laterality Date  . BACK SURGERY    . CHOLECYSTECTOMY    . KNEE ARTHROSCOPY  06/07/2017  . LAPAROSCOPIC UNILATERAL SALPINGO OOPHERECTOMY      . TUBAL LIGATION     Social History   Social History Narrative  . Not on file    Objective: Vital Signs: BP 117/74 (BP Location: Left Arm, Patient Position: Sitting, Cuff Size: Normal)   Pulse 93   Resp 14   Ht 5' 8.5" (1.74 m)   Wt 176 lb (79.8 kg)   BMI 26.37 kg/m    Physical Exam  Constitutional: She is oriented to person, place, and time. She appears well-developed and well-nourished.  HENT:  Head: Normocephalic and atraumatic.  Eyes: Conjunctivae and EOM are normal.  Neck: Normal range of motion.  Cardiovascular: Normal rate, regular rhythm, normal heart sounds and intact distal pulses.  Pulmonary/Chest: Effort normal and breath sounds normal.  Abdominal: Soft. Bowel sounds are normal.  Lymphadenopathy:    She has no cervical adenopathy.  Neurological: She is alert and oriented to person, place, and time.  Skin: Skin is warm and dry. Capillary refill takes less than 2 seconds.  Psychiatric: She has a normal mood and affect. Her behavior is normal.  Nursing note and vitals reviewed.    Musculoskeletal Exam: C-spine thoracic lumbar spine limited range of motion without discomfort.  Shoulder joints elbow joints wrist joint MCPs PIPs were in good range of motion.  She has some DIP and PIP thickening.  No synovitis was noted.  Hip joints knee joints ankles MTPs PIPs were in good range of motion.  She has some tenderness over trochanteric bursa. CDAI Exam: CDAI Score: Not documented Patient Global Assessment: Not documented; Provider Global Assessment: Not documented Swollen: Not documented; Tender: Not documented Joint Exam   Not documented   There is currently no information documented on the homunculus. Go to the Rheumatology activity and complete the homunculus joint exam.  Investigation: No additional findings.  Imaging: Ct Abdomen Pelvis W Contrast  Result Date: 11/17/2017 CLINICAL DATA:  Epigastric region pain and nausea. EXAM: CT ABDOMEN AND PELVIS WITH  CONTRAST TECHNIQUE: Multidetector CT imaging of the abdomen and pelvis was performed using the standard protocol following bolus administration of intravenous contrast. CONTRAST:  100 mL ISOVUE-300 IOPAMIDOL (ISOVUE-300) INJECTION 61%, COMPARISON:  May 07, 2014 FINDINGS: Lower chest: There is mild bibasilar atelectatic change. No lung base edema or consolidation. Hepatobiliary: No focal liver lesions are evident. Gallbladder is absent. There is mild biliary duct dilatation. The common bile duct measures 12 mm proximally with tapering distally. No biliary duct mass or calculus is evident. Pancreas: There is slight decreased attenuation in the head and portions of the body of the pancreas compared to the remainder of the pancreas. There is no well-defined pancreatic mass or duct dilatation. No peripancreatic fluid evident. Spleen: No splenic lesions are evident. There is a small accessory spleen anteriorly. Adrenals/Urinary Tract: Adrenals bilaterally appear unremarkable. Kidneys bilaterally show no evident mass or hydronephrosis on either side. There is no evident renal or ureteral calculus on either side. Urinary bladder is distended without wall thickening. Stomach/Bowel: There are sigmoid diverticula without diverticulitis. There is no appreciable bowel wall or mesenteric thickening. There is moderate stool throughout the colon. There is no evident bowel obstruction. No free air  or portal venous air. Vascular/Lymphatic: The aorta is somewhat tortuous with mild atherosclerotic calcification. No aneurysm. Major mesenteric arterial vessels appear patent. No adenopathy is evident in the abdomen or pelvis. Reproductive: Uterus is anteverted.  No evident pelvic mass. Other: Appendix appears normal. No evident abscess or ascites in the abdomen or pelvis. Musculoskeletal: There is degenerative change at L5-S1. There are no blastic or lytic bone lesions. There is a probable small bone island in the right acetabular  region. No intramuscular or abdominal wall lesions are evident. IMPRESSION: 1. There is somewhat decreased attenuation involving the head and portions of the body of the pancreas, possibly due to a degree of pancreatitis. There is no well-defined mass or pancreatic duct dilatation. No peripancreatic fluid or pseudocyst. This finding may warrant nonemergent pancreatic MR to further assess. 2. Absent gallbladder with intrahepatic and extrahepatic biliary duct dilatation. No biliary duct mass or calculus evident. 3. No evident bowel obstruction. There are sigmoid diverticula without diverticulitis. No abscess in the abdomen pelvis. Appendix appears normal. 4.  No evident renal or ureteral calculus.  No hydronephrosis. 5.  Mild aortic atherosclerosis. Aortic Atherosclerosis (ICD10-I70.0). Electronically Signed   By: Lowella Grip III M.D.   On: 11/17/2017 07:39    Recent Labs: Lab Results  Component Value Date   WBC 8.0 11/17/2017   HGB 12.8 11/17/2017   PLT 273 11/17/2017   NA 136 11/17/2017   K 3.8 11/17/2017   CL 100 11/17/2017   CO2 23 11/17/2017   GLUCOSE 165 (H) 11/17/2017   BUN 14 11/17/2017   CREATININE 0.76 11/17/2017   BILITOT 1.1 11/17/2017   ALKPHOS 80 11/17/2017   AST 89 (H) 11/17/2017   ALT 32 11/17/2017   PROT 6.6 11/17/2017   ALBUMIN 3.9 11/17/2017   CALCIUM 9.3 11/17/2017   GFRAA >60 11/17/2017   QFTBGOLDPLUS NEGATIVE 08/16/2017    Speciality Comments: No specialty comments available.  Procedures:  No procedures performed Allergies: Hydroxychloroquine   Assessment / Plan:     Visit Diagnoses: Inflammatory arthritis-she had an episode of severe recurrent inflammatory arthritis for few months.  She was given a trial of Plaquenil to which she responded to although it was discontinued due to rash.  She has had no recurrence of inflammatory arthritis since then.  Positive ANA (antinuclear antibody)-ENA panel and complements were normal.  Fibromyalgia-she has  generalized pain discomfort and positive tender points.  She has been having frequent flares of fibromyalgia.  She has been under a lot of stress as her son-in-law had a stroke.  She is not having adequate response with nortriptyline.  She complains of severe fatigue and generalized pain.  Need for regular exercise was discussed.  Good sleep hygiene was discussed.  I have also added Robaxin 500 mg 1 tablet p.o. twice daily as needed.  Total 60 tablets with 2 refills were given.  Side effects were reviewed.  She has been advised not to drive if she takes Robaxin.  Benefits from massage was also discussed.  Primary osteoarthritis of both hands-she continues to have some stiffness in her hands.  But no synovitis was noted.  Primary osteoarthritis of both knees-she has chronic pain in her knee joints.  DDD (degenerative disc disease), cervical-she has some limitation of range of motion.  DDD (degenerative disc disease), lumbar-she has some chronic discomfort in her lower back.  Other fatigue-related to fibromyalgia, insomnia and stress.  Other medical problems are listed as follows:  History of diabetes mellitus  History of pancreatitis  History of hypercholesterolemia  History of hypothyroidism   Orders: No orders of the defined types were placed in this encounter.  Meds ordered this encounter  Medications  . methocarbamol (ROBAXIN) 500 MG tablet    Sig: Take 1 tablet (500 mg total) by mouth 2 (two) times daily as needed for muscle spasms.    Dispense:  60 tablet    Refill:  1    Face-to-face time spent with patient was 30 minutes. Greater than 50% of time was spent in counseling and coordination of care.  Follow-Up Instructions: Return in about 6 months (around 06/01/2018) for Osteoarthritis, Fibromyalgia.   Bo Merino, MD  Note - This record has been created using Editor, commissioning.  Chart creation errors have been sought, but may not always  have been located. Such  creation errors do not reflect on  the standard of medical care.

## 2017-12-14 ENCOUNTER — Ambulatory Visit: Payer: Self-pay | Admitting: Physician Assistant

## 2017-12-20 ENCOUNTER — Other Ambulatory Visit: Payer: Self-pay | Admitting: Gastroenterology

## 2017-12-20 DIAGNOSIS — K5289 Other specified noninfective gastroenteritis and colitis: Secondary | ICD-10-CM | POA: Diagnosis not present

## 2017-12-20 DIAGNOSIS — K8500 Idiopathic acute pancreatitis without necrosis or infection: Secondary | ICD-10-CM

## 2017-12-20 DIAGNOSIS — K219 Gastro-esophageal reflux disease without esophagitis: Secondary | ICD-10-CM | POA: Diagnosis not present

## 2017-12-20 DIAGNOSIS — M797 Fibromyalgia: Secondary | ICD-10-CM | POA: Diagnosis not present

## 2017-12-20 DIAGNOSIS — K5900 Constipation, unspecified: Secondary | ICD-10-CM | POA: Diagnosis not present

## 2017-12-20 DIAGNOSIS — E119 Type 2 diabetes mellitus without complications: Secondary | ICD-10-CM | POA: Diagnosis not present

## 2017-12-20 DIAGNOSIS — Z7984 Long term (current) use of oral hypoglycemic drugs: Secondary | ICD-10-CM | POA: Diagnosis not present

## 2017-12-21 DIAGNOSIS — Z79899 Other long term (current) drug therapy: Secondary | ICD-10-CM | POA: Diagnosis not present

## 2017-12-21 DIAGNOSIS — M797 Fibromyalgia: Secondary | ICD-10-CM | POA: Diagnosis not present

## 2017-12-21 DIAGNOSIS — M0579 Rheumatoid arthritis with rheumatoid factor of multiple sites without organ or systems involvement: Secondary | ICD-10-CM | POA: Diagnosis not present

## 2017-12-29 ENCOUNTER — Ambulatory Visit
Admission: RE | Admit: 2017-12-29 | Discharge: 2017-12-29 | Disposition: A | Discharge status: Home / Self Care | Payer: Medicare HMO | Source: Ambulatory Visit | Attending: Gastroenterology | Admitting: Gastroenterology

## 2017-12-29 DIAGNOSIS — K8500 Idiopathic acute pancreatitis without necrosis or infection: Secondary | ICD-10-CM

## 2017-12-29 DIAGNOSIS — R932 Abnormal findings on diagnostic imaging of liver and biliary tract: Secondary | ICD-10-CM | POA: Diagnosis not present

## 2017-12-29 DIAGNOSIS — Z8719 Personal history of other diseases of the digestive system: Secondary | ICD-10-CM | POA: Diagnosis not present

## 2017-12-29 MED ORDER — GADOBENATE DIMEGLUMINE 529 MG/ML IV SOLN
15.0000 mL | Freq: Once | INTRAVENOUS | Status: AC | PRN
  Administered 2017-12-29: 15 mL via INTRAVENOUS

## 2018-01-07 DIAGNOSIS — R69 Illness, unspecified: Secondary | ICD-10-CM | POA: Diagnosis not present

## 2018-01-14 ENCOUNTER — Telehealth: Payer: Self-pay | Admitting: Rheumatology

## 2018-01-14 NOTE — Telephone Encounter (Signed)
I received a note from patient.  She states she has been having a lot of discomfort from fibromyalgia.  She denies any joint swelling.  She states she was recently seen by pain management physician who advised that she should be reevaluated for rheumatoid arthritis that she is not having enough relief from her pain. Patient denies any joint swelling at this time.  On her chart review I also found that she had seen Dr. Posey Pronto her rheumatologist recently and was prescribed methotrexate.  Patient states that she was not prescribed methotrexate and the office did not call her back with the lab results.  She is a still awaiting a phone call from the office. At this point I have advised her to return for follow-up with Dr. Posey Pronto or [place] in case she develops joint swelling.    Bo Merino, MD

## 2018-01-17 ENCOUNTER — Ambulatory Visit: Payer: Self-pay | Admitting: Rheumatology

## 2018-01-17 DIAGNOSIS — E039 Hypothyroidism, unspecified: Secondary | ICD-10-CM | POA: Diagnosis not present

## 2018-01-17 DIAGNOSIS — M1990 Unspecified osteoarthritis, unspecified site: Secondary | ICD-10-CM | POA: Diagnosis not present

## 2018-01-17 DIAGNOSIS — E119 Type 2 diabetes mellitus without complications: Secondary | ICD-10-CM | POA: Diagnosis not present

## 2018-01-17 DIAGNOSIS — M797 Fibromyalgia: Secondary | ICD-10-CM | POA: Diagnosis not present

## 2018-01-17 DIAGNOSIS — I868 Varicose veins of other specified sites: Secondary | ICD-10-CM | POA: Diagnosis not present

## 2018-01-17 DIAGNOSIS — Z7984 Long term (current) use of oral hypoglycemic drugs: Secondary | ICD-10-CM | POA: Diagnosis not present

## 2018-01-19 DIAGNOSIS — R102 Pelvic and perineal pain: Secondary | ICD-10-CM | POA: Diagnosis not present

## 2018-01-19 DIAGNOSIS — M0600 Rheumatoid arthritis without rheumatoid factor, unspecified site: Secondary | ICD-10-CM | POA: Diagnosis not present

## 2018-01-27 DIAGNOSIS — Z79899 Other long term (current) drug therapy: Secondary | ICD-10-CM | POA: Diagnosis not present

## 2018-01-27 DIAGNOSIS — M353 Polymyalgia rheumatica: Secondary | ICD-10-CM | POA: Diagnosis not present

## 2018-02-09 DIAGNOSIS — E119 Type 2 diabetes mellitus without complications: Secondary | ICD-10-CM | POA: Diagnosis not present

## 2018-02-09 DIAGNOSIS — Z23 Encounter for immunization: Secondary | ICD-10-CM | POA: Diagnosis not present

## 2018-02-09 DIAGNOSIS — G894 Chronic pain syndrome: Secondary | ICD-10-CM | POA: Diagnosis not present

## 2018-02-09 DIAGNOSIS — E039 Hypothyroidism, unspecified: Secondary | ICD-10-CM | POA: Diagnosis not present

## 2018-02-23 DIAGNOSIS — M47816 Spondylosis without myelopathy or radiculopathy, lumbar region: Secondary | ICD-10-CM | POA: Diagnosis not present

## 2018-02-23 DIAGNOSIS — M797 Fibromyalgia: Secondary | ICD-10-CM | POA: Diagnosis not present

## 2018-02-23 DIAGNOSIS — G894 Chronic pain syndrome: Secondary | ICD-10-CM | POA: Diagnosis not present

## 2018-02-23 DIAGNOSIS — M2550 Pain in unspecified joint: Secondary | ICD-10-CM | POA: Diagnosis not present

## 2018-02-23 DIAGNOSIS — M069 Rheumatoid arthritis, unspecified: Secondary | ICD-10-CM | POA: Diagnosis not present

## 2018-03-01 DIAGNOSIS — L821 Other seborrheic keratosis: Secondary | ICD-10-CM | POA: Diagnosis not present

## 2018-03-01 DIAGNOSIS — I781 Nevus, non-neoplastic: Secondary | ICD-10-CM | POA: Diagnosis not present

## 2018-03-01 DIAGNOSIS — Z85828 Personal history of other malignant neoplasm of skin: Secondary | ICD-10-CM | POA: Diagnosis not present

## 2018-03-01 DIAGNOSIS — L820 Inflamed seborrheic keratosis: Secondary | ICD-10-CM | POA: Diagnosis not present

## 2018-03-17 DIAGNOSIS — G3184 Mild cognitive impairment, so stated: Secondary | ICD-10-CM | POA: Insufficient documentation

## 2018-03-18 DIAGNOSIS — E538 Deficiency of other specified B group vitamins: Secondary | ICD-10-CM | POA: Insufficient documentation

## 2018-03-30 NOTE — Progress Notes (Deleted)
Office Visit Note  Patient: Ashley Perkins             Date of Birth: [birth year]           MRN: [id number]             PCP: Gregor Hams, FNP Referring: Gregor Hams, FNP Visit Date: 04/13/2018 Occupation: @GUAROCC @  Subjective:  No chief complaint on file.   History of Present Illness: Ashley Perkins is a 78 y.o. female ***   Activities of Daily Living:  Patient reports morning stiffness for *** {minute/hour:19697}.   Patient {ACTIONS;DENIES/REPORTS:21021675::"Denies"} nocturnal pain.  Difficulty dressing/grooming: {ACTIONS;DENIES/REPORTS:21021675::"Denies"} Difficulty climbing stairs: {ACTIONS;DENIES/REPORTS:21021675::"Denies"} Difficulty getting out of chair: {ACTIONS;DENIES/REPORTS:21021675::"Denies"} Difficulty using hands for taps, buttons, cutlery, and/or writing: {ACTIONS;DENIES/REPORTS:21021675::"Denies"}  No Rheumatology ROS completed.   PMFS History:  Patient Active Problem List   Diagnosis Date Noted  . Fibromyalgia 07/15/2017  . Primary osteoarthritis of both hands 07/15/2017  . Primary osteoarthritis of both knees 07/15/2017  . DDD (degenerative disc disease), cervical 07/15/2017  . DDD (degenerative disc disease), lumbar 07/15/2017  . History of knee surgery 07/15/2017  . Elevated liver function tests   . Generalized abdominal pain   . Depression   . Acute pancreatitis 05/07/2014  . Transaminitis 05/07/2014  . DM2 (diabetes mellitus, type 2) ([place]) 05/07/2014    Past Medical History:  Diagnosis Date  . Diabetes mellitus without complication ([place])   . Fibromyalgia   . Hypercholesteremia     Family History  Problem Relation Age of Onset  . Dementia Mother   . Diabetes Father   . Cancer Father   . Heart Problems Father   . Cancer Brother        prostate  . Heart Problems Brother    Past Surgical History:  Procedure Laterality Date  . BACK SURGERY    . CHOLECYSTECTOMY    . KNEE ARTHROSCOPY  06/07/2017  . LAPAROSCOPIC UNILATERAL  SALPINGO OOPHERECTOMY    . TUBAL LIGATION     Social History   Social History Narrative  . Not on file    There is no immunization history on file for this patient.   Objective: Vital Signs: There were no vitals taken for this visit.   Physical Exam   Musculoskeletal Exam: ***  CDAI Exam: CDAI Score: Not documented Patient Global Assessment: Not documented; Provider Global Assessment: Not documented Swollen: Not documented; Tender: Not documented Joint Exam   Not documented   There is currently no information documented on the homunculus. Go to the Rheumatology activity and complete the homunculus joint exam.  Investigation: No additional findings.  Imaging: No results found.  Recent Labs: Lab Results  Component Value Date   WBC 8.0 11/17/2017   HGB 12.8 11/17/2017   PLT 273 11/17/2017   NA 136 11/17/2017   K 3.8 11/17/2017   CL 100 11/17/2017   CO2 23 11/17/2017   GLUCOSE 165 (H) 11/17/2017   BUN 14 11/17/2017   CREATININE 0.76 11/17/2017   BILITOT 1.1 11/17/2017   ALKPHOS 80 11/17/2017   AST 89 (H) 11/17/2017   ALT 32 11/17/2017   PROT 6.6 11/17/2017   ALBUMIN 3.9 11/17/2017   CALCIUM 9.3 11/17/2017   GFRAA >60 11/17/2017   QFTBGOLDPLUS NEGATIVE 08/16/2017    Speciality Comments: No specialty comments available.  Procedures:  No procedures performed Allergies: Hydroxychloroquine   Assessment / Plan:     Visit Diagnoses: Inflammatory arthritis -  episode of severe recurrent inflammatory arthritis for few  months. Trial of Plaquenil was effective but dc'd due to rash. no recurrence of syonvitis   Positive ANA (antinuclear antibody) - ENA panel and complements were normal  Fibromyalgia  Primary osteoarthritis of both hands  Primary osteoarthritis of both knees  DDD (degenerative disc disease), cervical  DDD (degenerative disc disease), lumbar  Other fatigue  History of diabetes mellitus  History of pancreatitis  History of  hypercholesterolemia  History of hypothyroidism   Orders: No orders of the defined types were placed in this encounter.  No orders of the defined types were placed in this encounter.   Face-to-face time spent with patient was *** minutes. Greater than 50% of time was spent in counseling and coordination of care.  Follow-Up Instructions: No follow-ups on file.   Ofilia Neas, PA-C  Note - This record has been created using Dragon software.  Chart creation errors have been sought, but may not always  have been located. Such creation errors do not reflect on  the standard of medical care.

## 2018-04-13 ENCOUNTER — Ambulatory Visit: Payer: Self-pay | Admitting: Physician Assistant

## 2018-05-02 ENCOUNTER — Encounter (HOSPITAL_COMMUNITY): Payer: Self-pay

## 2018-05-02 ENCOUNTER — Inpatient Hospital Stay (HOSPITAL_COMMUNITY)
Admission: EM | Admit: 2018-05-02 | Discharge: 2018-05-05 | DRG: 440 | Disposition: A | Discharge status: Home / Self Care | Payer: Medicare HMO | Attending: Internal Medicine | Admitting: Internal Medicine

## 2018-05-02 DIAGNOSIS — Z7984 Long term (current) use of oral hypoglycemic drugs: Secondary | ICD-10-CM

## 2018-05-02 DIAGNOSIS — R748 Abnormal levels of other serum enzymes: Secondary | ICD-10-CM

## 2018-05-02 DIAGNOSIS — E119 Type 2 diabetes mellitus without complications: Secondary | ICD-10-CM

## 2018-05-02 DIAGNOSIS — Z9049 Acquired absence of other specified parts of digestive tract: Secondary | ICD-10-CM

## 2018-05-02 DIAGNOSIS — E039 Hypothyroidism, unspecified: Secondary | ICD-10-CM | POA: Diagnosis present

## 2018-05-02 DIAGNOSIS — M353 Polymyalgia rheumatica: Secondary | ICD-10-CM | POA: Diagnosis present

## 2018-05-02 DIAGNOSIS — Z833 Family history of diabetes mellitus: Secondary | ICD-10-CM

## 2018-05-02 DIAGNOSIS — M797 Fibromyalgia: Secondary | ICD-10-CM | POA: Diagnosis present

## 2018-05-02 DIAGNOSIS — K859 Acute pancreatitis without necrosis or infection, unspecified: Principal | ICD-10-CM | POA: Diagnosis present

## 2018-05-02 DIAGNOSIS — Z79890 Hormone replacement therapy (postmenopausal): Secondary | ICD-10-CM

## 2018-05-02 DIAGNOSIS — R933 Abnormal findings on diagnostic imaging of other parts of digestive tract: Secondary | ICD-10-CM

## 2018-05-02 DIAGNOSIS — R7401 Elevation of levels of liver transaminase levels: Secondary | ICD-10-CM | POA: Diagnosis present

## 2018-05-02 DIAGNOSIS — K8020 Calculus of gallbladder without cholecystitis without obstruction: Secondary | ICD-10-CM | POA: Diagnosis present

## 2018-05-02 DIAGNOSIS — Z791 Long term (current) use of non-steroidal anti-inflammatories (NSAID): Secondary | ICD-10-CM

## 2018-05-02 DIAGNOSIS — E7800 Pure hypercholesterolemia, unspecified: Secondary | ICD-10-CM | POA: Diagnosis present

## 2018-05-02 DIAGNOSIS — K5900 Constipation, unspecified: Secondary | ICD-10-CM | POA: Diagnosis present

## 2018-05-02 DIAGNOSIS — R740 Nonspecific elevation of levels of transaminase and lactic acid dehydrogenase [LDH]: Secondary | ICD-10-CM | POA: Diagnosis present

## 2018-05-02 LAB — CBC WITH DIFFERENTIAL/PLATELET
ABS IMMATURE GRANULOCYTES: 0.02 10*3/uL (ref 0.00–0.07)
BASOS PCT: 0 %
Basophils Absolute: 0 10*3/uL (ref 0.0–0.1)
Eosinophils Absolute: 0.1 10*3/uL (ref 0.0–0.5)
Eosinophils Relative: 1 %
HCT: 39.3 % (ref 36.0–46.0)
HEMOGLOBIN: 12.7 g/dL (ref 12.0–15.0)
Immature Granulocytes: 0 %
Lymphocytes Relative: 25 %
Lymphs Abs: 1.7 10*3/uL (ref 0.7–4.0)
MCH: 29.9 pg (ref 26.0–34.0)
MCHC: 32.3 g/dL (ref 30.0–36.0)
MCV: 92.5 fL (ref 80.0–100.0)
MONOS PCT: 6 %
Monocytes Absolute: 0.4 10*3/uL (ref 0.1–1.0)
NEUTROS ABS: 4.5 10*3/uL (ref 1.7–7.7)
NEUTROS PCT: 68 %
Platelets: 233 10*3/uL (ref 150–400)
RBC: 4.25 MIL/uL (ref 3.87–5.11)
RDW: 13.9 % (ref 11.5–15.5)
WBC: 6.7 10*3/uL (ref 4.0–10.5)
nRBC: 0 % (ref 0.0–0.2)

## 2018-05-02 MED ORDER — SODIUM CHLORIDE 0.9 % IV SOLN
INTRAVENOUS | Status: DC
  Administered 2018-05-02: via INTRAVENOUS

## 2018-05-02 NOTE — ED Triage Notes (Signed)
Pt states she has flare up of pancreatitis. Pain started today, does have some nausea. States all symptoms present as one of her flare ups.

## 2018-05-03 ENCOUNTER — Emergency Department (HOSPITAL_COMMUNITY): Payer: Medicare HMO

## 2018-05-03 ENCOUNTER — Other Ambulatory Visit: Payer: Self-pay

## 2018-05-03 DIAGNOSIS — R748 Abnormal levels of other serum enzymes: Secondary | ICD-10-CM | POA: Diagnosis not present

## 2018-05-03 DIAGNOSIS — K859 Acute pancreatitis without necrosis or infection, unspecified: Secondary | ICD-10-CM | POA: Diagnosis present

## 2018-05-03 DIAGNOSIS — Z833 Family history of diabetes mellitus: Secondary | ICD-10-CM | POA: Diagnosis not present

## 2018-05-03 DIAGNOSIS — R740 Nonspecific elevation of levels of transaminase and lactic acid dehydrogenase [LDH]: Secondary | ICD-10-CM

## 2018-05-03 DIAGNOSIS — E7800 Pure hypercholesterolemia, unspecified: Secondary | ICD-10-CM | POA: Diagnosis present

## 2018-05-03 DIAGNOSIS — Z7984 Long term (current) use of oral hypoglycemic drugs: Secondary | ICD-10-CM | POA: Diagnosis not present

## 2018-05-03 DIAGNOSIS — K5900 Constipation, unspecified: Secondary | ICD-10-CM | POA: Diagnosis present

## 2018-05-03 DIAGNOSIS — M797 Fibromyalgia: Secondary | ICD-10-CM | POA: Diagnosis present

## 2018-05-03 DIAGNOSIS — M353 Polymyalgia rheumatica: Secondary | ICD-10-CM | POA: Diagnosis present

## 2018-05-03 DIAGNOSIS — E039 Hypothyroidism, unspecified: Secondary | ICD-10-CM | POA: Diagnosis present

## 2018-05-03 DIAGNOSIS — Z791 Long term (current) use of non-steroidal anti-inflammatories (NSAID): Secondary | ICD-10-CM | POA: Diagnosis not present

## 2018-05-03 DIAGNOSIS — Z9049 Acquired absence of other specified parts of digestive tract: Secondary | ICD-10-CM | POA: Diagnosis not present

## 2018-05-03 DIAGNOSIS — E119 Type 2 diabetes mellitus without complications: Secondary | ICD-10-CM

## 2018-05-03 DIAGNOSIS — K8020 Calculus of gallbladder without cholecystitis without obstruction: Secondary | ICD-10-CM | POA: Diagnosis present

## 2018-05-03 DIAGNOSIS — Z79890 Hormone replacement therapy (postmenopausal): Secondary | ICD-10-CM | POA: Diagnosis not present

## 2018-05-03 LAB — COMPREHENSIVE METABOLIC PANEL
ALBUMIN: 4 g/dL (ref 3.5–5.0)
ALT: 58 U/L — AB (ref 0–44)
AST: 172 U/L — AB (ref 15–41)
Alkaline Phosphatase: 135 U/L — ABNORMAL HIGH (ref 38–126)
Anion gap: 11 (ref 5–15)
BILIRUBIN TOTAL: 0.7 mg/dL (ref 0.3–1.2)
BUN: 13 mg/dL (ref 8–23)
CHLORIDE: 98 mmol/L (ref 98–111)
CO2: 25 mmol/L (ref 22–32)
CREATININE: 0.7 mg/dL (ref 0.44–1.00)
Calcium: 8.9 mg/dL (ref 8.9–10.3)
GFR calc Af Amer: >60 mL/min (ref >60)
GLUCOSE: 167 mg/dL — AB (ref 70–99)
POTASSIUM: 3.8 mmol/L (ref 3.5–5.1)
Sodium: 134 mmol/L — ABNORMAL LOW (ref 135–145)
TOTAL PROTEIN: 7 g/dL (ref 6.5–8.1)

## 2018-05-03 LAB — URINALYSIS, ROUTINE W REFLEX MICROSCOPIC
Bilirubin Urine: NEGATIVE
Glucose, UA: 50 mg/dL — AB
Hgb urine dipstick: NEGATIVE
Ketones, ur: NEGATIVE mg/dL
Leukocytes,Ua: NEGATIVE
Nitrite: NEGATIVE
Protein, ur: NEGATIVE mg/dL
Specific Gravity, Urine: 1.005 (ref 1.005–1.030)
pH: 9 — ABNORMAL HIGH (ref 5.0–8.0)

## 2018-05-03 LAB — GLUCOSE, CAPILLARY
GLUCOSE-CAPILLARY: 101 mg/dL — AB (ref 70–99)
Glucose-Capillary: 154 mg/dL — ABNORMAL HIGH (ref 70–99)
Glucose-Capillary: 90 mg/dL (ref 70–99)
Glucose-Capillary: 127 mg/dL — ABNORMAL HIGH (ref 70–99)

## 2018-05-03 LAB — CBG MONITORING, ED: Glucose-Capillary: 110 mg/dL — ABNORMAL HIGH (ref 70–99)

## 2018-05-03 LAB — LIPASE, BLOOD: LIPASE: 110 U/L — AB (ref 11–51)

## 2018-05-03 MED ORDER — ACETAMINOPHEN 650 MG RE SUPP: 650.0000 mg | Freq: Four times a day (QID) | RECTAL | Status: DC | PRN

## 2018-05-03 MED ORDER — AMITRIPTYLINE HCL 25 MG PO TABS
75.0000 mg | ORAL_TABLET | Freq: Every day | ORAL | Status: DC
  Administered 2018-05-03 – 2018-05-04 (×2): 75 mg via ORAL
  Filled 2018-05-03 (×2): qty 3

## 2018-05-03 MED ORDER — ACETAMINOPHEN 325 MG PO TABS
650.0000 mg | ORAL_TABLET | Freq: Four times a day (QID) | ORAL | Status: DC | PRN
  Administered 2018-05-03: 650 mg via ORAL
  Filled 2018-05-03: qty 2

## 2018-05-03 MED ORDER — SODIUM CHLORIDE 0.9 % IV SOLN
INTRAVENOUS | Status: DC
  Administered 2018-05-03 – 2018-05-05 (×3): via INTRAVENOUS

## 2018-05-03 MED ORDER — MORPHINE SULFATE (PF) 2 MG/ML IV SOLN: 2.0000 mg | INTRAVENOUS | Status: DC | PRN

## 2018-05-03 MED ORDER — FENTANYL CITRATE (PF) 100 MCG/2ML IJ SOLN
50.0000 ug | Freq: Once | INTRAMUSCULAR | Status: AC
  Administered 2018-05-03: 50 ug via INTRAVENOUS
  Filled 2018-05-03: qty 2

## 2018-05-03 MED ORDER — ONDANSETRON HCL 4 MG PO TABS: 4.0000 mg | ORAL_TABLET | Freq: Four times a day (QID) | ORAL | Status: DC | PRN

## 2018-05-03 MED ORDER — INSULIN ASPART 100 UNIT/ML ~~LOC~~ SOLN
0.0000 [IU] | SUBCUTANEOUS | Status: DC
  Administered 2018-05-03 – 2018-05-04 (×2): 2 [IU] via SUBCUTANEOUS

## 2018-05-03 MED ORDER — SODIUM CHLORIDE 0.9 % IV BOLUS
1000.0000 mL | Freq: Once | INTRAVENOUS | Status: AC
  Administered 2018-05-03: 1000 mL via INTRAVENOUS

## 2018-05-03 MED ORDER — ADULT MULTIVITAMIN W/MINERALS CH
1.0000 | ORAL_TABLET | Freq: Every day | ORAL | Status: DC
  Administered 2018-05-03 – 2018-05-05 (×3): 1 via ORAL
  Filled 2018-05-03 (×3): qty 1

## 2018-05-03 MED ORDER — LIP MEDEX EX OINT
TOPICAL_OINTMENT | CUTANEOUS | Status: AC
  Filled 2018-05-03: qty 7

## 2018-05-03 MED ORDER — ONDANSETRON HCL 4 MG/2ML IJ SOLN: 4.0000 mg | Freq: Four times a day (QID) | INTRAMUSCULAR | Status: DC | PRN

## 2018-05-03 MED ORDER — ENOXAPARIN SODIUM 40 MG/0.4ML ~~LOC~~ SOLN
40.0000 mg | Freq: Every day | SUBCUTANEOUS | Status: DC
  Administered 2018-05-03 – 2018-05-05 (×3): 40 mg via SUBCUTANEOUS
  Filled 2018-05-03 (×4): qty 0.4

## 2018-05-03 MED ORDER — GABAPENTIN 300 MG PO CAPS
300.0000 mg | ORAL_CAPSULE | Freq: Three times a day (TID) | ORAL | Status: DC
  Administered 2018-05-03 – 2018-05-05 (×7): 300 mg via ORAL
  Filled 2018-05-03 (×7): qty 1

## 2018-05-03 MED ORDER — ONDANSETRON HCL 4 MG/2ML IJ SOLN
4.0000 mg | Freq: Once | INTRAMUSCULAR | Status: AC
  Administered 2018-05-03: 4 mg via INTRAVENOUS
  Filled 2018-05-03: qty 2

## 2018-05-03 MED ORDER — LEVOTHYROXINE SODIUM 88 MCG PO TABS
88.0000 ug | ORAL_TABLET | Freq: Every day | ORAL | Status: DC
  Administered 2018-05-03 – 2018-05-05 (×3): 88 ug via ORAL
  Filled 2018-05-03 (×4): qty 1

## 2018-05-03 NOTE — ED Notes (Signed)
ED TO INPATIENT HANDOFF REPORT  Name/Age/Gender Ashley Perkins 78 y.o. female  Code Status    Code Status Orders  (From admission, onward)         Start     Ordered   05/03/18 0610  Full code  Continuous     05/03/18 0609        Code Status History    Date Active Date Inactive Code Status Order ID Comments User Context   05/07/2014 0524 05/09/2014 2128 Full Code [id number]  Etta Quill, DO Inpatient      Home/SNF/Other Home  Chief Complaint Pancreatitis  Level of Care/Admitting Diagnosis ED Disposition    ED Disposition Condition [place]: [facility] [[id number]]  Level of Care: Med-Surg [16]  Diagnosis: Recurrent acute pancreatitis [983382]  Admitting Physician: Etta Quill [[id number]]  Attending Physician: Etta Quill [[id number]]  PT Class (Do Not Modify): Observation [104]  PT Acc Code (Do Not Modify): Observation [10022]       Medical History Past Medical History:  Diagnosis Date  . Diabetes mellitus without complication ([place])   . Fibromyalgia   . Hypercholesteremia     Allergies Allergies  Allergen Reactions  . Hydroxychloroquine Hives and Rash    All over All over     IV Location/Drains/Wounds Patient Lines/Drains/Airways Status   Active Line/Drains/Airways    Name:   Placement date:   Placement time:   Site:   Days:   Peripheral IV 05/02/18 Left Forearm   05/02/18    2329    Forearm   1          Labs/Imaging Results for orders placed or performed during the hospital encounter of 05/02/18 (from the past 48 hour(s))  CBC with Differential/Platelet     Status: None   Collection Time: 05/02/18 11:12 PM  Result Value Ref Range   WBC 6.7 4.0 - 10.5 K/uL   RBC 4.25 3.87 - 5.11 MIL/uL   Hemoglobin 12.7 12.0 - 15.0 g/dL   HCT 39.3 36.0 - 46.0 %   MCV 92.5 80.0 - 100.0 fL   MCH 29.9 26.0 - 34.0 pg   MCHC 32.3 30.0 - 36.0 g/dL   RDW 13.9 11.5 - 15.5 %   Platelets 233 150 - 400 K/uL   nRBC 0.0  0.0 - 0.2 %   Neutrophils Relative % 68 %   Neutro Abs 4.5 1.7 - 7.7 K/uL   Lymphocytes Relative 25 %   Lymphs Abs 1.7 0.7 - 4.0 K/uL   Monocytes Relative 6 %   Monocytes Absolute 0.4 0.1 - 1.0 K/uL   Eosinophils Relative 1 %   Eosinophils Absolute 0.1 0.0 - 0.5 K/uL   Basophils Relative 0 %   Basophils Absolute 0.0 0.0 - 0.1 K/uL   Immature Granulocytes 0 %   Abs Immature Granulocytes 0.02 0.00 - 0.07 K/uL    Comment: Performed at [facility], [place] [address] [place], [place] [address]  Comprehensive metabolic panel     Status: Abnormal   Collection Time: 05/02/18 11:12 PM  Result Value Ref Range   Sodium 134 (L) 135 - 145 mmol/L   Potassium 3.8 3.5 - 5.1 mmol/L   Chloride 98 98 - 111 mmol/L   CO2 25 22 - 32 mmol/L   Glucose, Bld 167 (H) 70 - 99 mg/dL   BUN 13 8 - 23 mg/dL   Creatinine, Ser 0.70 0.44 - 1.00 mg/dL   Calcium  8.9 8.9 - 10.3 mg/dL   Total Protein 7.0 6.5 - 8.1 g/dL   Albumin 4.0 3.5 - 5.0 g/dL   AST 172 (H) 15 - 41 U/L   ALT 58 (H) 0 - 44 U/L   Alkaline Phosphatase 135 (H) 38 - 126 U/L   Total Bilirubin 0.7 0.3 - 1.2 mg/dL   GFR calc non Af Amer >60 >60 mL/min   GFR calc Af Amer >60 >60 mL/min   Anion gap 11 5 - 15    Comment: Performed at [facility], [place] [address] [place], [place] [address]  Lipase, blood     Status: Abnormal   Collection Time: 05/02/18 11:12 PM  Result Value Ref Range   Lipase 110 (H) 11 - 51 U/L    Comment: Performed at [facility], [place] [address] [place], [place] [address]  CBG monitoring, ED     Status: Abnormal   Collection Time: 05/03/18  8:04 AM  Result Value Ref Range   Glucose-Capillary 110 (H) 70 - 99 mg/dL   US Abdomen Limited Ruq  Result Date: 05/03/2018 CLINICAL DATA:  Initial evaluation for recurrent pancreatitis, elevated LFTs EXAM: ULTRASOUND ABDOMEN LIMITED RIGHT UPPER QUADRANT COMPARISON:  None. FINDINGS: Gallbladder: Surgically absent. Common bile duct:  Diameter: 8 mm, within normal limits for age and post cholecystectomy changes. Liver: No focal lesion identified. Within normal limits in parenchymal echogenicity. Portal vein is patent on color Doppler imaging with normal direction of blood flow towards the liver. IMPRESSION: 1. Prior cholecystectomy. 2. Otherwise unremarkable right upper quadrant ultrasound. No biliary dilatation Electronically Signed   By: Jeannine Boga M.D.   On: 05/03/2018 04:04    Pending Labs Unresulted Labs (From admission, onward)    Start     Ordered   05/04/18 0500  Comprehensive metabolic panel  Tomorrow morning,   R     05/03/18 0609   05/04/18 0500  CBC  Tomorrow morning,   R     05/03/18 0609   05/03/18 0633  Urinalysis, Routine w reflex microscopic  ONCE - STAT,   STAT     05/03/18 0632          Vitals/Pain Today's Vitals   05/03/18 0630 05/03/18 0700 05/03/18 0730 05/03/18 0800  BP: 138/68 132/67 128/68 137/73  Pulse: 75 81 75 78  Resp:   18 18  Temp:      TempSrc:      SpO2: 93% 100% 96% 98%  Weight:      Height:      PainSc:        Isolation Precautions No active isolations  Medications Medications  0.9 %  sodium chloride infusion ( Intravenous Stopped 05/03/18 0700)  morphine 2 MG/ML injection 2-4 mg (has no administration in time range)  0.9 %  sodium chloride infusion (125 mL/hr Intravenous Rate/Dose Change 05/03/18 0700)  insulin aspart (novoLOG) injection 0-9 Units (0 Units Subcutaneous Not Given 05/03/18 0805)  levothyroxine (SYNTHROID, LEVOTHROID) tablet 88 mcg (has no administration in time range)  gabapentin (NEURONTIN) capsule 300 mg (has no administration in time range)  multivitamin with minerals tablet 1 tablet (has no administration in time range)  amitriptyline (ELAVIL) tablet 75 mg (has no administration in time range)  acetaminophen (TYLENOL) tablet 650 mg (has no administration in time range)    Or  acetaminophen (TYLENOL) suppository 650 mg (has no administration  in time range)  ondansetron (ZOFRAN) tablet 4 mg (has no administration in time range)  Or  ondansetron (ZOFRAN) injection 4 mg (has no administration in time range)  enoxaparin (LOVENOX) injection 40 mg (has no administration in time range)  sodium chloride 0.9 % bolus 1,000 mL (0 mLs Intravenous Stopped 05/03/18 0534)  sodium chloride 0.9 % bolus 1,000 mL (0 mLs Intravenous Stopped 05/03/18 0534)  fentaNYL (SUBLIMAZE) injection 50 mcg (50 mcg Intravenous Given 05/03/18 0206)  ondansetron (ZOFRAN) injection 4 mg (4 mg Intravenous Given 05/03/18 0205)    Mobility walks

## 2018-05-03 NOTE — Progress Notes (Addendum)
78 year old Caucasian female Type 2 diabetes mellitus on oral meds Hypothyroidism Fibromyalgia Rheumatoid arthritis versus inflammatory arthritis previously on Plaquenil degenerative disc disease in addition Admitted with recurrent pancreatitis 2/17  Note last admission Was for pancreatitis 05/06/2014-05/09/2014 She was also hospitalized last Sept 2019 with a recurrent pancreatitis per her report--I am unable to locate this in [place]  She had a cholecystectomy in the past previously followed by Dr. Galen Manila. Roma Kayser in [place] for intra-papillary ductal neoplasm of the pancreas-it appears she was only seen once by them 09/03/2017-MRCP 12/29/2017 showed very mild hepatic ductal dilatation with no mention of any lesions and compatible with postcholecystectomy physiology  On exam she is lively and verbose in no distress and relates in great and exhaustive detail all of her medical history, even non -solicited history She doesn't appear to be in pain She c/o mainly a cramping abd pain at presentation which started last pm around 9 pm and crescendoed until she couldn't bear it at 03:00 am when she came to ED--she felt initially that she might have had a viral illness after a full day of gardening and being outdoors   eomi ncat  Chest clear abd soft no rebound No le edema   [facility] aware of patient-d/w Dr. Penelope Coop MRCP ordered, get lipds [lipids in 2016 150] --he is avaialble if any glaring anomaly on MRCP otherwsie recs OP follow up Dr. Oletta Lamas and or [facility] once resolved  let her eat-liquids for now Repeat LFT [place] am  Verneita Griffes, MD [facility] 2:20 PM

## 2018-05-03 NOTE — ED Provider Notes (Signed)
[facility] DEPT Provider Note   CSN: [id number] Arrival date & time: 05/02/18  2248  Time seen 1:10 AM  History   Chief Complaint Chief Complaint  Patient presents with  . Pancreatitis    HPI Ashley Perkins is a 78 y.o. female.     HPI patient reports she has had 3-4 episodes of pancreatitis over the past 2 years.  The first time she was admitted for 3 days.  She was fine all day today.  She denies eating anything different although she did say she ate 2 small pieces of pizza this evening.  At about 9:30 PM she started having upper abdominal discomfort that radiates up into her breast and into her shoulder blades.  She describes it as a cramping pain that waxes and wanes and sometimes gets sharp.  She has had nausea without vomiting or diarrhea.  She denies fever.  She denies any burning sensation.  She does complain of a lot of abdominal bloating.  She is status post cholecystectomy.  She states she has had endoscopy, colonoscopy, CT scan, and MRI scan and they do not know the etiology of her pancreatitis.  PCP Gregor Hams, FNP GI Dr. Oletta Lamas  Past Medical History:  Diagnosis Date  . Diabetes mellitus without complication ([place])   . Fibromyalgia   . Hypercholesteremia     Patient Active Problem List   Diagnosis Date Noted  . Fibromyalgia 07/15/2017  . Primary osteoarthritis of both hands 07/15/2017  . Primary osteoarthritis of both knees 07/15/2017  . DDD (degenerative disc disease), cervical 07/15/2017  . DDD (degenerative disc disease), lumbar 07/15/2017  . History of knee surgery 07/15/2017  . Elevated liver function tests   . Generalized abdominal pain   . Depression   . Acute pancreatitis 05/07/2014  . Transaminitis 05/07/2014  . DM2 (diabetes mellitus, type 2) ([place]) 05/07/2014    Past Surgical History:  Procedure Laterality Date  . BACK SURGERY    . CHOLECYSTECTOMY    . KNEE ARTHROSCOPY  06/07/2017  . LAPAROSCOPIC  UNILATERAL SALPINGO OOPHERECTOMY    . TUBAL LIGATION       OB History   No obstetric history on file.      Home Medications    Prior to Admission medications   Medication Sig Start Date End Date Taking? Authorizing Provider  acetaminophen (TYLENOL) 650 MG CR tablet Take 650 mg by mouth every 8 (eight) hours as needed for pain.   Yes [provider]  amitriptyline (ELAVIL) 75 MG tablet Take 75 mg by mouth at bedtime.   Yes [provider]  B Complex Vitamins (VITAMIN B COMPLEX PO) Take 1 tablet by mouth daily.   Yes [provider]  gabapentin (NEURONTIN) 300 MG capsule Take 300 mg by mouth 3 (three) times daily. 04/06/18  Yes [provider]  glimepiride (AMARYL) 2 MG tablet Take 2 mg by mouth daily with breakfast.  11/05/16  Yes [provider]  ibuprofen (ADVIL,MOTRIN) 200 MG tablet Take 400 mg by mouth every 6 (six) hours as needed for moderate pain.   Yes [provider]  levothyroxine (SYNTHROID, LEVOTHROID) 88 MCG tablet Take 88 mcg by mouth daily before breakfast. 02/13/14  Yes [provider]  metFORMIN (GLUCOPHAGE) 500 MG tablet Take 1,000 mg by mouth 2 (two) times daily with a meal.    Yes [provider]  Multiple Vitamin (MULTIVITAMIN WITH MINERALS) TABS Take 1 tablet by mouth daily.   Yes [provider]  ondansetron (ZOFRAN ODT) 4 MG disintegrating tablet 4mg  ODT q4 hours prn nausea/vomit 11/17/17  Yes Julianne Rice, MD  pioglitazone (ACTOS) 15 MG tablet Take 15 mg by mouth daily.  11/05/16  Yes [provider]  meloxicam (MOBIC) 7.5 MG tablet Take 1 tablet (7.5 mg total) by mouth daily. Patient not taking: Reported on 05/02/2018 10/18/17   Bo Merino, MD  methocarbamol (ROBAXIN) 500 MG tablet Take 1 tablet (500 mg total) by mouth 2 (two) times daily as needed for muscle spasms. Patient not taking: Reported on 05/02/2018 12/01/17   Bo Merino, MD    Family History Family  History  Problem Relation Age of Onset  . Dementia Mother   . Diabetes Father   . Cancer Father   . Heart Problems Father   . Cancer Brother        prostate  . Heart Problems Brother     Social History Social History   Tobacco Use  . Smoking status: Never Smoker  . Smokeless tobacco: Never Used  Substance Use Topics  . Alcohol use: No  . Drug use: No     Allergies   Hydroxychloroquine   Review of Systems Review of Systems  All other systems reviewed and are negative.    Physical Exam Updated Vital Signs BP (!) 142/70 (BP Location: Right Arm)   Pulse 81   Temp 97.9 F (36.6 C) (Oral)   Resp 16   Ht 5\' 5"  (1.651 m)   Wt 68 kg   SpO2 93%   BMI 24.95 kg/m   Physical Exam Vitals signs and nursing note reviewed.  Constitutional:      General: She is not in acute distress.    Appearance: Normal appearance. She is well-developed. She is not ill-appearing or toxic-appearing.  HENT:     Head: Normocephalic and atraumatic.     Right Ear: External ear normal.     Left Ear: External ear normal.     Nose: Nose normal. No mucosal edema or rhinorrhea.     Mouth/Throat:     Mouth: Mucous membranes are dry.     Dentition: No dental abscesses.     Pharynx: No uvula swelling.  Eyes:     Conjunctiva/sclera: Conjunctivae normal.     Pupils: Pupils are equal, round, and reactive to light.  Neck:     Musculoskeletal: Full passive range of motion without pain, normal range of motion and neck supple.  Cardiovascular:     Rate and Rhythm: Normal rate and regular rhythm.     Heart sounds: Normal heart sounds. No murmur. No friction rub. No gallop.   Pulmonary:     Effort: Pulmonary effort is normal. No respiratory distress.     Breath sounds: Normal breath sounds. No wheezing, rhonchi or rales.  Chest:     Chest wall: No tenderness or crepitus.  Abdominal:     General: Bowel sounds are normal. There is no distension.     Palpations: Abdomen is soft.     Tenderness:  There is no abdominal tenderness. There is no guarding or rebound.     Comments: She indicates her whole upper abdomen is painful however she is not painful to palpation.  Musculoskeletal: Normal range of motion.        General: No tenderness.     Comments: Moves all extremities well.   Skin:    General: Skin is warm and dry.     Coloration: Skin is not pale.  Findings: No erythema or rash.  Neurological:     Mental Status: She is alert and oriented to person, place, and time.     Cranial Nerves: No cranial nerve deficit.  Psychiatric:        Mood and Affect: Mood is not anxious.        Speech: Speech normal.        Behavior: Behavior normal.      ED Treatments / Results  Labs (all labs ordered are listed, but only abnormal results are displayed) Results for orders placed or performed during the hospital encounter of 05/02/18  CBC with Differential/Platelet  Result Value Ref Range   WBC 6.7 4.0 - 10.5 K/uL   RBC 4.25 3.87 - 5.11 MIL/uL   Hemoglobin 12.7 12.0 - 15.0 g/dL   HCT 39.3 36.0 - 46.0 %   MCV 92.5 80.0 - 100.0 fL   MCH 29.9 26.0 - 34.0 pg   MCHC 32.3 30.0 - 36.0 g/dL   RDW 13.9 11.5 - 15.5 %   Platelets 233 150 - 400 K/uL   nRBC 0.0 0.0 - 0.2 %   Neutrophils Relative % 68 %   Neutro Abs 4.5 1.7 - 7.7 K/uL   Lymphocytes Relative 25 %   Lymphs Abs 1.7 0.7 - 4.0 K/uL   Monocytes Relative 6 %   Monocytes Absolute 0.4 0.1 - 1.0 K/uL   Eosinophils Relative 1 %   Eosinophils Absolute 0.1 0.0 - 0.5 K/uL   Basophils Relative 0 %   Basophils Absolute 0.0 0.0 - 0.1 K/uL   Immature Granulocytes 0 %   Abs Immature Granulocytes 0.02 0.00 - 0.07 K/uL  Comprehensive metabolic panel  Result Value Ref Range   Sodium 134 (L) 135 - 145 mmol/L   Potassium 3.8 3.5 - 5.1 mmol/L   Chloride 98 98 - 111 mmol/L   CO2 25 22 - 32 mmol/L   Glucose, Bld 167 (H) 70 - 99 mg/dL   BUN 13 8 - 23 mg/dL   Creatinine, Ser 0.70 0.44 - 1.00 mg/dL   Calcium 8.9 8.9 - 10.3 mg/dL   Total  Protein 7.0 6.5 - 8.1 g/dL   Albumin 4.0 3.5 - 5.0 g/dL   AST 172 (H) 15 - 41 U/L   ALT 58 (H) 0 - 44 U/L   Alkaline Phosphatase 135 (H) 38 - 126 U/L   Total Bilirubin 0.7 0.3 - 1.2 mg/dL   GFR calc non Af Amer >60 >60 mL/min   GFR calc Af Amer >60 >60 mL/min   Anion gap 11 5 - 15  Lipase, blood  Result Value Ref Range   Lipase 110 (H) 11 - 51 U/L   Laboratory interpretation all normal except elevation of LFTs, first time since 2016, minimal elevation of lipase, in 2016 her number was around the thousand.    EKG None  Radiology US Abdomen Limited Ruq  Result Date: 05/03/2018 CLINICAL DATA:  Initial evaluation for recurrent pancreatitis, elevated LFTs EXAM: ULTRASOUND ABDOMEN LIMITED RIGHT UPPER QUADRANT COMPARISON:  None. FINDINGS: Gallbladder: Surgically absent. Common bile duct: Diameter: 8 mm, within normal limits for age and post cholecystectomy changes. Liver: No focal lesion identified. Within normal limits in parenchymal echogenicity. Portal vein is patent on color Doppler imaging with normal direction of blood flow towards the liver. IMPRESSION: 1. Prior cholecystectomy. 2. Otherwise unremarkable right upper quadrant ultrasound. No biliary dilatation Electronically Signed   By: Jeannine Boga M.D.   On: 05/03/2018 04:04    Procedures  Procedures (including critical care time)  Medications Ordered in ED Medications  0.9 %  sodium chloride infusion ( Intravenous New Bag/Given 05/02/18 2339)  morphine 2 MG/ML injection 2-4 mg (has no administration in time range)  0.9 %  sodium chloride infusion (has no administration in time range)  insulin aspart (novoLOG) injection 0-9 Units (has no administration in time range)  sodium chloride 0.9 % bolus 1,000 mL (0 mLs Intravenous Stopped 05/03/18 0534)  sodium chloride 0.9 % bolus 1,000 mL (0 mLs Intravenous Stopped 05/03/18 0534)  fentaNYL (SUBLIMAZE) injection 50 mcg (50 mcg Intravenous Given 05/03/18 0206)  ondansetron (ZOFRAN)  injection 4 mg (4 mg Intravenous Given 05/03/18 0205)     Initial Impression / Assessment and Plan / ED Course  I have reviewed the triage vital signs and the nursing notes.  Pertinent labs & imaging results that were available during my care of the patient were reviewed by me and considered in my medical decision making (see chart for details).        Patient was given IV fluids and IV pain and nausea medication.  Ultrasound was done to look for retained biliary stone.   05:00 recheck, starting to feel better, discussed her [place] results. Talked about admission and she is agreeable.   06:01 Dr Alcario Drought, hospitalist, will admit    Final Clinical Impressions(s) / ED Diagnoses   Final diagnoses:  Acute pancreatitis, unspecified complication status, unspecified pancreatitis type  Elevated liver enzymes    Plan admission  Rolland Porter, MD, Barbette Or, MD 05/03/18 [phone]

## 2018-05-03 NOTE — H&P (Signed)
History and Physical    Ashley Perkins YTK:[id number] DOB: 22-Feb-1941 DOA: 05/02/2018  PCP: Gregor Hams, FNP  Patient coming from: Home  I have personally briefly reviewed patient's old medical records in [place]  Chief Complaint: Pancreatitis  HPI: Ashley Perkins is a 78 y.o. female with medical history significant of DM2, PMR.  Patient has had 3-4 episodes of pancreatitis over past 2 years.  Admitted for 2 days the first time.  Pancreatitis is idiopathic, though they did discover 2x very small IPMNs back in June of last year on MRI at [facility].  She had another pancreatitis episode in Sept.  MRI / MRCP in Oct was read as normal.  Has had cholecystectomy in past.  Last evening at  about 9:30 PM she started having upper abdominal discomfort that radiates up into her breast and into her shoulder blades.  She describes it as a cramping pain that waxes and wanes and sometimes gets sharp.  She has had nausea without vomiting or diarrhea.  She denies fever.  She denies any burning sensation.  She does complain of a lot of abdominal bloating.   ED Course: LFT slightly elevated: AST 172, alt 58, alk 135, lipase 110.  RUQ US unremarkable (other than prior cholecystectomy).  Patient given 2L NS and hospitalist asked to admit.   Review of Systems: As per HPI otherwise 10 point review of systems negative.   Past Medical History:  Diagnosis Date  . Diabetes mellitus without complication ([place])   . Fibromyalgia   . Hypercholesteremia     Past Surgical History:  Procedure Laterality Date  . BACK SURGERY    . CHOLECYSTECTOMY    . KNEE ARTHROSCOPY  06/07/2017  . LAPAROSCOPIC UNILATERAL SALPINGO OOPHERECTOMY    . TUBAL LIGATION       reports that she has never smoked. She has never used smokeless tobacco. She reports that she does not drink alcohol or use drugs.  Allergies  Allergen Reactions  . Hydroxychloroquine Hives and Rash    All over All over     Family History    Problem Relation Age of Onset  . Dementia Mother   . Diabetes Father   . Cancer Father   . Heart Problems Father   . Cancer Brother        prostate  . Heart Problems Brother      Prior to Admission medications   Medication Sig Start Date End Date Taking? Authorizing Provider  acetaminophen (TYLENOL) 650 MG CR tablet Take 650 mg by mouth every 8 (eight) hours as needed for pain.   Yes [provider]  amitriptyline (ELAVIL) 75 MG tablet Take 75 mg by mouth at bedtime.   Yes [provider]  B Complex Vitamins (VITAMIN B COMPLEX PO) Take 1 tablet by mouth daily.   Yes [provider]  gabapentin (NEURONTIN) 300 MG capsule Take 300 mg by mouth 3 (three) times daily. 04/06/18  Yes [provider]  glimepiride (AMARYL) 2 MG tablet Take 2 mg by mouth daily with breakfast.  11/05/16  Yes [provider]  ibuprofen (ADVIL,MOTRIN) 200 MG tablet Take 400 mg by mouth every 6 (six) hours as needed for moderate pain.   Yes [provider]  levothyroxine (SYNTHROID, LEVOTHROID) 88 MCG tablet Take 88 mcg by mouth daily before breakfast. 02/13/14  Yes [provider]  metFORMIN (GLUCOPHAGE) 500 MG tablet Take 1,000 mg by mouth 2 (two) times daily with a meal.  Yes [provider]  Multiple Vitamin (MULTIVITAMIN WITH MINERALS) TABS Take 1 tablet by mouth daily.   Yes [provider]  ondansetron (ZOFRAN ODT) 4 MG disintegrating tablet 27m ODT q4 hours prn nausea/vomit 11/17/17  Yes YJulianne Rice MD  pioglitazone (ACTOS) 15 MG tablet Take 15 mg by mouth daily.  11/05/16  Yes [provider]    Physical Exam: Vitals:   05/03/18 0130 05/03/18 0324 05/03/18 0418 05/03/18 0535  BP: (!) 156/77 117/71  (!) 142/70  Pulse: 84 80  81  Resp:  16  16  Temp:    97.9 F (36.6 C)  TempSrc:    Oral  SpO2: 100% 98%  93%  Weight:   68 kg   Height:   _0  (1.651 m)     Constitutional: NAD, calm, comfortable Eyes: PERRL,  lids and conjunctivae normal ENMT: Mucous membranes are moist. Posterior pharynx clear of any exudate or lesions.Normal dentition.  Neck: normal, supple, no masses, no thyromegaly Respiratory: clear to auscultation bilaterally, no wheezing, no crackles. Normal respiratory effort. No accessory muscle use.  Cardiovascular: Regular rate and rhythm, no murmurs / rubs / gallops. No extremity edema. 2+ pedal pulses. No carotid bruits.  Abdomen: Epigastric TTP Musculoskeletal: no clubbing / cyanosis. No joint deformity upper and lower extremities. Good ROM, no contractures. Normal muscle tone.  Skin: no rashes, lesions, ulcers. No induration Neurologic: CN 2-12 grossly intact. Sensation intact, DTR normal. Strength 5/5 in all 4.  Psychiatric: Normal judgment and insight. Alert and oriented x 3. Normal mood.    Labs on Admission: I have personally reviewed following labs and imaging studies  CBC: Recent Labs  Lab 05/02/18 2312  WBC 6.7  NEUTROABS 4.5  HGB 12.7  HCT 39.3  MCV 92.5  PLT 2808  Basic Metabolic Panel: Recent Labs  Lab 05/02/18 2312  NA 134*  K 3.8  CL 98  CO2 25  GLUCOSE 167*  BUN 13  CREATININE 0.70  CALCIUM 8.9   GFR: Estimated Creatinine Clearance: 52.2 mL/min (by C-G formula based on SCr of 0.7 mg/dL). Liver Function Tests: Recent Labs  Lab 05/02/18 2312  AST 172*  ALT 58*  ALKPHOS 135*  BILITOT 0.7  PROT 7.0  ALBUMIN 4.0   Recent Labs  Lab 05/02/18 2312  LIPASE 110*   No results for input(s): AMMONIA in the last 168 hours. Coagulation Profile: No results for input(s): INR, PROTIME in the last 168 hours. Cardiac Enzymes: No results for input(s): CKTOTAL, CKMB, CKMBINDEX, TROPONINI in the last 168 hours. BNP (last 3 results) No results for input(s): PROBNP in the last 8760 hours. HbA1C: No results for input(s): HGBA1C in the last 72 hours. CBG: No results for input(s): GLUCAP in the last 168 hours. Lipid Profile: No results for input(s):  CHOL, HDL, LDLCALC, TRIG, CHOLHDL, LDLDIRECT in the last 72 hours. Thyroid Function Tests: No results for input(s): TSH, T4TOTAL, FREET4, T3FREE, THYROIDAB in the last 72 hours. Anemia Panel: No results for input(s): VITAMINB12, FOLATE, FERRITIN, TIBC, IRON, RETICCTPCT in the last 72 hours. Urine analysis:    Component Value Date/Time   COLORURINE COLORLESS (A) 11/17/2017 0401   APPEARANCEUR CLEAR 11/17/2017 0401   LABSPEC 1.006 11/17/2017 0401   PHURINE 9.0 (H) 11/17/2017 0401   GLUCOSEU 50 (A) 11/17/2017 0401   HGBUR NEGATIVE 11/17/2017 0401   BILIRUBINUR NEGATIVE 11/17/2017 0401   KETONESUR 5 (A) 11/17/2017 0401   PROTEINUR NEGATIVE 11/17/2017 0401   UROBILINOGEN 0.2 12/13/2010 0421   NITRITE  NEGATIVE 11/17/2017 0401   LEUKOCYTESUR NEGATIVE 11/17/2017 0401    Radiological Exams on Admission: US Abdomen Limited Ruq  Result Date: 05/03/2018 CLINICAL DATA:  Initial evaluation for recurrent pancreatitis, elevated LFTs EXAM: ULTRASOUND ABDOMEN LIMITED RIGHT UPPER QUADRANT COMPARISON:  None. FINDINGS: Gallbladder: Surgically absent. Common bile duct: Diameter: 8 mm, within normal limits for age and post cholecystectomy changes. Liver: No focal lesion identified. Within normal limits in parenchymal echogenicity. Portal vein is patent on color Doppler imaging with normal direction of blood flow towards the liver. IMPRESSION: 1. Prior cholecystectomy. 2. Otherwise unremarkable right upper quadrant ultrasound. No biliary dilatation Electronically Signed   By: Jeannine Boga M.D.   On: 05/03/2018 04:04    EKG: Independently reviewed.  Assessment/Plan Principal Problem:   Acute pancreatitis Active Problems:   Transaminitis   DM2 (diabetes mellitus, type 2) (HCC)   Recurrent acute pancreatitis   PMR (polymyalgia rheumatica) (HCC)    1. Recurrent acute pancreatitis - 1. Idiopathic 2. Does have h/o very tiny IPMNs of unclear significance on June MRCP last year. 3. Discussed with  patient, she likely will want to f/u with GI after this admit to see if they want to re-image yet again 1. Though worth noting that MRCP was performed after Sept pancreatitis and radiologist didn't even call these at that time. 4. IVF 5. NPO 6. Morphine PRN pain 7. zofran PRN nausea 8. Repeat CBC / CMP tomorrow AM 2. DM2 - 1. Hold home PO meds 2. Sensitive SSI q4h 3. PMR - currently not on any meds, no prednisone currently, etc.  DVT prophylaxis: Lovenox Code Status: Full Family Communication: Husband at bedside Disposition Plan: Home after admit Consults called: None Admission status: Place in 31    [place], [place] Hospitalists  How to contact the [facility] Attending or Consulting provider [place] or covering provider during after hours [place], for this patient?  1. Check the care team in [place] and look for a) attending/consulting [facility] provider listed and b) the [facility] team listed 2. Log into [email or web address]  Amion Physician Scheduling and messaging for groups and whole hospitals  On call and physician scheduling software for group practices, residents, hospitalists and other medical providers for call, clinic, rotation and shift schedules. OnCall [employer] is a hospital-wide system for scheduling doctors and paging doctors on call. EasyPlot is for scientific plotting and data analysis.  [email or web address]  and use [place]'s universal password to access. If you do not have the password, please contact the hospital operator.  3. Locate the [facility] provider you are looking for under [facility] and page to a number that you can be directly reached. 4. If you still have difficulty reaching the provider, please page the [facility] (Director on Call) for the Hospitalists listed on amion for assistance.  05/03/2018, 6:33 AM

## 2018-05-04 ENCOUNTER — Inpatient Hospital Stay (HOSPITAL_COMMUNITY): Payer: Medicare HMO

## 2018-05-04 DIAGNOSIS — R748 Abnormal levels of other serum enzymes: Secondary | ICD-10-CM

## 2018-05-04 LAB — COMPREHENSIVE METABOLIC PANEL
ALT: 59 U/L — ABNORMAL HIGH (ref 0–44)
AST: 59 U/L — ABNORMAL HIGH (ref 15–41)
Albumin: 3.2 g/dL — ABNORMAL LOW (ref 3.5–5.0)
Alkaline Phosphatase: 106 U/L (ref 38–126)
Anion gap: 6 (ref 5–15)
BUN: 5 mg/dL — ABNORMAL LOW (ref 8–23)
CALCIUM: 8.4 mg/dL — AB (ref 8.9–10.3)
CO2: 25 mmol/L (ref 22–32)
Chloride: 107 mmol/L (ref 98–111)
Creatinine, Ser: 0.68 mg/dL (ref 0.44–1.00)
GFR calc Af Amer: >60 mL/min (ref >60)
Glucose, Bld: 131 mg/dL — ABNORMAL HIGH (ref 70–99)
Potassium: 3.7 mmol/L (ref 3.5–5.1)
Sodium: 138 mmol/L (ref 135–145)
Total Bilirubin: 0.6 mg/dL (ref 0.3–1.2)
Total Protein: 5.8 g/dL — ABNORMAL LOW (ref 6.5–8.1)

## 2018-05-04 LAB — GLUCOSE, CAPILLARY
GLUCOSE-CAPILLARY: 119 mg/dL — AB (ref 70–99)
Glucose-Capillary: 126 mg/dL — ABNORMAL HIGH (ref 70–99)
Glucose-Capillary: 198 mg/dL — ABNORMAL HIGH (ref 70–99)
Glucose-Capillary: 201 mg/dL — ABNORMAL HIGH (ref 70–99)
Glucose-Capillary: 113 mg/dL — ABNORMAL HIGH (ref 70–99)

## 2018-05-04 LAB — CBC
HCT: 37.1 % (ref 36.0–46.0)
Hemoglobin: 11.6 g/dL — ABNORMAL LOW (ref 12.0–15.0)
MCH: 29.4 pg (ref 26.0–34.0)
MCHC: 31.3 g/dL (ref 30.0–36.0)
MCV: 94.2 fL (ref 80.0–100.0)
Platelets: 186 10*3/uL (ref 150–400)
RBC: 3.94 MIL/uL (ref 3.87–5.11)
RDW: 13.9 % (ref 11.5–15.5)
WBC: 5.3 10*3/uL (ref 4.0–10.5)
nRBC: 0 % (ref 0.0–0.2)

## 2018-05-04 LAB — LIPID PANEL
Cholesterol: 154 mg/dL (ref 0–200)
HDL: 53 mg/dL (ref >40)
LDL Cholesterol: 89 mg/dL (ref 0–99)
TRIGLYCERIDES: 60 mg/dL (ref <150)
Total CHOL/HDL Ratio: 2.9 RATIO
VLDL: 12 mg/dL (ref 0–40)

## 2018-05-04 MED ORDER — INSULIN ASPART 100 UNIT/ML ~~LOC~~ SOLN
0.0000 [IU] | Freq: Three times a day (TID) | SUBCUTANEOUS | Status: DC
  Administered 2018-05-04: 3 [IU] via SUBCUTANEOUS
  Administered 2018-05-05: 1 [IU] via SUBCUTANEOUS

## 2018-05-04 MED ORDER — GADOBUTROL 1 MMOL/ML IV SOLN
8.0000 mL | Freq: Once | INTRAVENOUS | Status: AC | PRN
  Administered 2018-05-04: 8 mL via INTRAVENOUS

## 2018-05-04 MED ORDER — GLYCERIN (LAXATIVE) 2.1 G RE SUPP
1.0000 | Freq: Once | RECTAL | Status: AC
  Administered 2018-05-04: 1 via RECTAL
  Filled 2018-05-04: qty 1

## 2018-05-04 NOTE — Progress Notes (Signed)
Pt had 2 BM's, enema not given. MD made aware.

## 2018-05-04 NOTE — Progress Notes (Addendum)
PROGRESS NOTE    Ashley Perkins  WCH:[id number] DOB: 07/24/1940 DOA: 05/02/2018 PCP: Lyman Bishop, DO    Brief Narrative:  78-year-old female history of type 2 diabetes, PMR, 3-4 episodes of prior pancreatitis over the past 2 years with pancreatitis felt to be idiopathic.  2 very small IPMNs were noted back in June of last year on MRI at [facility].  Patient with episode of pancreatitis in September MRI MRCP done in October was normal.  Patient status post cholecystectomy.  Patient presented to the ED with upper abdominal discomfort with radiation to her breast and her shoulders described as a cramping pain waxing and waning.  Patient noted to have elevated LFTs, lipase level of 110.  Right upper quadrant ultrasound which was done was unremarkable.  Patient admitted for acute pancreatitis placed on IV fluids, bowel rest, supportive care.   Assessment & Plan:   Principal Problem:   Acute pancreatitis Active Problems:   Transaminitis   DM2 (diabetes mellitus, type 2) (HCC)   Recurrent acute pancreatitis   PMR (polymyalgia rheumatica) (HCC)   Pancreatitis   Elevated liver enzymes  #1 recurrent acute pancreatitis Questionable etiology.  Idiopathic.  Patient did have a history of very tiny IPMN's of unclear significance on MRCP of June 2019.  Patient improving clinically.  Abdominal pain improved.  No further nausea or vomiting.  Tolerating clears.  LFTs trending down.  Lipase levels trending down.  Patient status post cholecystectomy.??  Passed gallstone.  MRCP was done this morning 05/04/2018 which was unremarkable.  Fasting lipid panel with a triglyceride level of 60.  Decrease IV fluids to 100 cc/h.  Advance diet to a full liquid diet.  Soft diet in the morning.  Continue supportive care.  Will likely need outpatient follow-up with GI on discharge.  2.  Diabetes mellitus type 2 CBG of 198 this morning.  Patient currently on clears.  Continue to hold oral hypoglycemic agents.  Continue sliding  scale insulin.  Change CBGs to before meals and at bedtime.  3.  Transaminitis Likely secondary to problem #1.  LFTs trending down.  Follow.  4.  PMR Not on any medications.  Outpatient follow-up.  5.  Constipation Patient with complaints of constipation.  Glycerin suppository.  If no improvement will give an enema.  6.  Hypothyroidism Continue home dose Synthroid.   DVT prophylaxis: Lovenox Code Status: Full Family Communication: Updated patient and husband at bedside. Disposition Plan: Likely home when clinically improved and tolerating a solid diet hopefully the next 24 to 48 hours.   Consultants:   None  Procedures:   MRCP 05/04/2018  Right upper quadrant ultrasound 05/03/2018  Antimicrobials:   None   Subjective: Patient sitting up in bed.  Denies any further nausea or vomiting.  States abdominal pain has improved.  No chest pain.  No shortness of breath.  Tolerated clear liquids.  Patient complaining of constipation.  Objective: Vitals:   05/03/18 1302 05/03/18 2034 05/04/18 0558 05/04/18 1412  BP: (!) 145/71 121/80 128/75 135/81  Pulse: 77 70 74 68  Resp: 18 16 15 16   Temp: 98 F (36.7 C) 98.4 F (36.9 C) 98.1 F (36.7 C) 98.3 F (36.8 C)  TempSrc: Oral Oral Oral Oral  SpO2: 99% 97% 97% 99%  Weight:      Height:        Intake/Output Summary (Last 24 hours) at 05/04/2018 1700 Last data filed at 05/04/2018 1600 Gross per 24 hour  Intake 2951.31 ml  Output -  Net 2951.31 ml   Filed Weights   05/03/18 0418 05/03/18 0841  Weight: 68 kg 85 kg    Examination:  General exam: Appears calm and comfortable  Respiratory system: Clear to auscultation. Respiratory effort normal. Cardiovascular system: S1 & S2 heard, RRR. No JVD, murmurs, rubs, gallops or clicks. No pedal edema. Gastrointestinal system: Abdomen is nondistended, soft and nontender. No organomegaly or masses felt. Normal bowel sounds heard. Central nervous system: Alert and oriented. No  focal neurological deficits. Extremities: Symmetric 5 x 5 power. Skin: No rashes, lesions or ulcers Psychiatry: Judgement and insight appear normal. Mood & affect appropriate.     Data Reviewed: I have personally reviewed following labs and imaging studies  CBC: Recent Labs  Lab 05/02/18 2312 05/04/18 0334  WBC 6.7 5.3  NEUTROABS 4.5  --   HGB 12.7 11.6*  HCT 39.3 37.1  MCV 92.5 94.2  PLT 233 944   Basic Metabolic Panel: Recent Labs  Lab 05/02/18 2312 05/04/18 0334  NA 134* 138  K 3.8 3.7  CL 98 107  CO2 25 25  GLUCOSE 167* 131*  BUN 13 5*  CREATININE 0.70 0.68  CALCIUM 8.9 8.4*   GFR: Estimated Creatinine Clearance: 62.4 mL/min (by C-G formula based on SCr of 0.68 mg/dL). Liver Function Tests: Recent Labs  Lab 05/02/18 2312 05/04/18 0334  AST 172* 59*  ALT 58* 59*  ALKPHOS 135* 106  BILITOT 0.7 0.6  PROT 7.0 5.8*  ALBUMIN 4.0 3.2*   Recent Labs  Lab 05/02/18 2312  LIPASE 110*   No results for input(s): AMMONIA in the last 168 hours. Coagulation Profile: No results for input(s): INR, PROTIME in the last 168 hours. Cardiac Enzymes: No results for input(s): CKTOTAL, CKMB, CKMBINDEX, TROPONINI in the last 168 hours. BNP (last 3 results) No results for input(s): PROBNP in the last 8760 hours. HbA1C: No results for input(s): HGBA1C in the last 72 hours. CBG: Recent Labs  Lab 05/03/18 2321 05/04/18 0333 05/04/18 0815 05/04/18 1207 05/04/18 1641  GLUCAP 127* 126* 119* 198* 201*   Lipid Profile: Recent Labs    05/04/18 0334  CHOL 154  HDL 53  LDLCALC 89  TRIG 60  CHOLHDL 2.9   Thyroid Function Tests: No results for input(s): TSH, T4TOTAL, FREET4, T3FREE, THYROIDAB in the last 72 hours. Anemia Panel: No results for input(s): VITAMINB12, FOLATE, FERRITIN, TIBC, IRON, RETICCTPCT in the last 72 hours. Sepsis Labs: No results for input(s): PROCALCITON, LATICACIDVEN in the last 168 hours.  No results found for this or any previous visit  (from the past 240 hour(s)).       Radiology Studies: Mr Abdomen Mrcp Wo Contrast  Result Date: 05/04/2018 CLINICAL DATA:  78 year old female with history of recurrent pancreatitis. EXAM: MRI ABDOMEN WITHOUT CONTRAST  (INCLUDING MRCP) TECHNIQUE: Multiplanar multisequence MR imaging of the abdomen was performed. Heavily T2-weighted images of the biliary and pancreatic ducts were obtained, and three-dimensional MRCP images were rendered by post processing. COMPARISON:  Abdominal MRI 12/29/2017. CT of the abdomen and pelvis 11/17/2017. FINDINGS: Lower chest: Unremarkable. Hepatobiliary: No suspicious cystic or solid hepatic lesions. Status post cholecystectomy. No intrahepatic biliary ductal dilatation noted on MRCP images. MRCP images demonstrate 7 mm common bile duct in the porta hepatis, likely reflective of benign post cholecystectomy physiology. No filling defect in the common bile duct to suggest choledocholithiasis. Pancreas: No pancreatic mass. No pancreatic ductal dilatation noted on MRCP images. No pancreatic or peripancreatic fluid or inflammatory changes. Spleen:  Unremarkable. Adrenals/Urinary Tract: Bilateral  kidneys and bilateral adrenal glands are normal in appearance. No hydroureteronephrosis in the visualized portions of the abdomen. Stomach/Bowel: Visualized portions are unremarkable. Vascular/Lymphatic: Aortic atherosclerosis, without evidence of aneurysm in the abdominal vasculature. No lymphadenopathy noted in the abdomen. Other: No significant volume of ascites noted in the visualized portions of the abdomen. Musculoskeletal: No aggressive appearing osseous lesions are noted in the visualized portions of the skeleton. IMPRESSION: 1. No findings to account for the patient's history of recurrent pancreatitis. 2. Aortic atherosclerosis. Electronically Signed   By: Vinnie Langton M.D.   On: 05/04/2018 10:39   Mr 3d Recon At Scanner  Result Date: 05/04/2018 CLINICAL DATA:  78 year old  female with history of recurrent pancreatitis. EXAM: MRI ABDOMEN WITHOUT CONTRAST  (INCLUDING MRCP) TECHNIQUE: Multiplanar multisequence MR imaging of the abdomen was performed. Heavily T2-weighted images of the biliary and pancreatic ducts were obtained, and three-dimensional MRCP images were rendered by post processing. COMPARISON:  Abdominal MRI 12/29/2017. CT of the abdomen and pelvis 11/17/2017. FINDINGS: Lower chest: Unremarkable. Hepatobiliary: No suspicious cystic or solid hepatic lesions. Status post cholecystectomy. No intrahepatic biliary ductal dilatation noted on MRCP images. MRCP images demonstrate 7 mm common bile duct in the porta hepatis, likely reflective of benign post cholecystectomy physiology. No filling defect in the common bile duct to suggest choledocholithiasis. Pancreas: No pancreatic mass. No pancreatic ductal dilatation noted on MRCP images. No pancreatic or peripancreatic fluid or inflammatory changes. Spleen:  Unremarkable. Adrenals/Urinary Tract: Bilateral kidneys and bilateral adrenal glands are normal in appearance. No hydroureteronephrosis in the visualized portions of the abdomen. Stomach/Bowel: Visualized portions are unremarkable. Vascular/Lymphatic: Aortic atherosclerosis, without evidence of aneurysm in the abdominal vasculature. No lymphadenopathy noted in the abdomen. Other: No significant volume of ascites noted in the visualized portions of the abdomen. Musculoskeletal: No aggressive appearing osseous lesions are noted in the visualized portions of the skeleton. IMPRESSION: 1. No findings to account for the patient's history of recurrent pancreatitis. 2. Aortic atherosclerosis. Electronically Signed   By: Vinnie Langton M.D.   On: 05/04/2018 10:39   US Abdomen Limited Ruq  Result Date: 05/03/2018 CLINICAL DATA:  Initial evaluation for recurrent pancreatitis, elevated LFTs EXAM: ULTRASOUND ABDOMEN LIMITED RIGHT UPPER QUADRANT COMPARISON:  None. FINDINGS: Gallbladder:  Surgically absent. Common bile duct: Diameter: 8 mm, within normal limits for age and post cholecystectomy changes. Liver: No focal lesion identified. Within normal limits in parenchymal echogenicity. Portal vein is patent on color Doppler imaging with normal direction of blood flow towards the liver. IMPRESSION: 1. Prior cholecystectomy. 2. Otherwise unremarkable right upper quadrant ultrasound. No biliary dilatation Electronically Signed   By: Jeannine Boga M.D.   On: 05/03/2018 04:04        Scheduled Meds: . amitriptyline  75 mg Oral QHS  . enoxaparin (LOVENOX) injection  40 mg Subcutaneous Daily  . gabapentin  300 mg Oral TID  . insulin aspart  0-9 Units Subcutaneous Q4H  . levothyroxine  88 mcg Oral QAC breakfast  . multivitamin with minerals  1 tablet Oral Daily   Continuous Infusions: . sodium chloride 100 mL/hr at 05/04/18 1600     LOS: 1 day    Time spent: 40 minutes    Irine Seal, MD [facility]  If 7PM-7AM, please contact night-coverage [email or web address] 05/04/2018, 5:00 PM

## 2018-05-05 LAB — COMPREHENSIVE METABOLIC PANEL
ALT: 44 U/L (ref 0–44)
AST: 34 U/L (ref 15–41)
Albumin: 3.3 g/dL — ABNORMAL LOW (ref 3.5–5.0)
Alkaline Phosphatase: 93 U/L (ref 38–126)
Anion gap: 7 (ref 5–15)
BUN: 8 mg/dL (ref 8–23)
CO2: 25 mmol/L (ref 22–32)
Calcium: 8.5 mg/dL — ABNORMAL LOW (ref 8.9–10.3)
Chloride: 106 mmol/L (ref 98–111)
Creatinine, Ser: 0.78 mg/dL (ref 0.44–1.00)
GFR calc Af Amer: >60 mL/min (ref >60)
GFR calc non Af Amer: >60 mL/min (ref >60)
Glucose, Bld: 145 mg/dL — ABNORMAL HIGH (ref 70–99)
Potassium: 3.6 mmol/L (ref 3.5–5.1)
SODIUM: 138 mmol/L (ref 135–145)
Total Bilirubin: 0.5 mg/dL (ref 0.3–1.2)
Total Protein: 5.8 g/dL — ABNORMAL LOW (ref 6.5–8.1)

## 2018-05-05 LAB — GLUCOSE, CAPILLARY
Glucose-Capillary: 142 mg/dL — ABNORMAL HIGH (ref 70–99)
Glucose-Capillary: 147 mg/dL — ABNORMAL HIGH (ref 70–99)

## 2018-05-05 NOTE — Progress Notes (Signed)
Patient discharged to home with family, discharge instructions reviewed with patient who verbalized understanding. No New RX.

## 2018-05-05 NOTE — Discharge Summary (Signed)
Physician Discharge Summary  Ashley Perkins MHW:[id number] DOB: Jul 30, 1940 DOA: 05/02/2018  PCP: Lyman Bishop, DO  Admit date: 05/02/2018 Discharge date: 05/05/2018  Time spent: 50 minutes  Recommendations for Outpatient Follow-up:  1. Follow-up with Lyman Bishop, DO in 2 weeks.  On follow-up patient need a comprehensive metabolic profile done to follow-up on electrolytes and renal function and LFTs. 2. Follow-up with Dr. Oletta Lamas, gastroenterology in 2 to 3 weeks.   Discharge Diagnoses:  Principal Problem:   Acute pancreatitis Active Problems:   Transaminitis   DM2 (diabetes mellitus, type 2) (HCC)   Recurrent acute pancreatitis   PMR (polymyalgia rheumatica) (HCC)   Pancreatitis   Elevated liver enzymes   Discharge Condition: Stable and improved.  Diet recommendation: Soft diet  Filed Weights   05/03/18 0418 05/03/18 0841  Weight: 68 kg 85 kg    History of present illness:  Per Dr Richmond Campbell is a 78 y.o. female with medical history significant of DM2, PMR.  Patient has had 3-4 episodes of pancreatitis over past 2 years.  Admitted for 2 days the first time.  Pancreatitis is idiopathic, though they did discover 2x very small IPMNs back in June of last year on MRI at [facility].  She had another pancreatitis episode in Sept.  MRI / MRCP in Oct was read as normal.  Has had cholecystectomy in past.  Last evening at about 9:30 PM she started having upper abdominal discomfort that radiates up into her breast and into her shoulder blades. She describes it as a cramping pain that waxes and wanes and sometimes gets sharp. She has had nausea without vomiting or diarrhea. She denies fever. She denies any burning sensation. She does complain of a lot of abdominal bloating.   ED Course: LFT slightly elevated: AST 172, alt 58, alk 135, lipase 110.  RUQ US unremarkable (other than prior cholecystectomy).  Patient given 2L NS and hospitalist asked to  admit.  Hospital Course:  1 recurrent acute pancreatitis Questionable etiology.  Idiopathic.  Patient did have a history of very tiny IPMN's of unclear significance on MRCP of June 2019.  Patient was admitted placed on bowel rest, IV fluids, antiemetics, pain management and supportive care.  Patient's LFTs trended down.  Patient had no nausea or emesis during the hospitalization and improved clinically.  Lipase levels trended down.  Patient status post cholecystectomy.??  Passed gallstone.  MRCP was done on 05/04/2018 which was unremarkable.  Fasting lipid panel with a triglyceride level of 60.  Patient was started on clear liquid diet and IV fluids discontinued.  Diet was advanced to a soft diet which patient tolerated.  Patient will be discharged home in stable and improved condition and is to follow-up with PCP and gastroenterology in the outpatient setting.   2.  Diabetes mellitus type 2 Patient's oral hypoglycemic agents were held throughout the hospitalization and patient maintained on sliding scale insulin.   3.  Transaminitis Likely secondary to problem #1.  LFTs trending down.  Outpatient follow-up.   4.  PMR Not on any medications.  Outpatient follow-up.  5.  Constipation Patient with complaints of constipation.    Patient given a glycerin suppository with good results.  Outpatient follow-up.    6.  Hypothyroidism Patient maintained on home regimen of Synthroid.   Procedures:  MRCP 05/04/2018  Right upper quadrant ultrasound 05/03/2018  Consultations:  None  Discharge Exam: Vitals:   05/04/18 2144 05/05/18 0546  BP: 133/66 (!) 142/82  Pulse:  79 78  Resp: 16 16  Temp: 98.1 F (36.7 C) 97.8 F (36.6 C)  SpO2: 100% 100%    General: NAD Cardiovascular: RRR Respiratory: CTAB  Discharge Instructions   Discharge Instructions    Diet general   Complete by:  As directed    Soft diet   Increase activity slowly   Complete by:  As directed      Allergies as  of 05/05/2018      Reactions   Hydroxychloroquine Hives, Rash   All over All over      Medication List    TAKE these medications   acetaminophen 650 MG CR tablet Commonly known as:  TYLENOL Take 650 mg by mouth every 8 (eight) hours as needed for pain.   amitriptyline 75 MG tablet Commonly known as:  ELAVIL Take 75 mg by mouth at bedtime.   gabapentin 300 MG capsule Commonly known as:  NEURONTIN Take 300 mg by mouth 3 (three) times daily.   glimepiride 2 MG tablet Commonly known as:  AMARYL Take 2 mg by mouth daily with breakfast.   ibuprofen 200 MG tablet Commonly known as:  ADVIL,MOTRIN Take 400 mg by mouth every 6 (six) hours as needed for moderate pain.   levothyroxine 88 MCG tablet Commonly known as:  SYNTHROID, LEVOTHROID Take 88 mcg by mouth daily before breakfast.   metFORMIN 500 MG tablet Commonly known as:  GLUCOPHAGE Take 1,000 mg by mouth 2 (two) times daily with a meal.   multivitamin with minerals Tabs tablet Take 1 tablet by mouth daily.   ondansetron 4 MG disintegrating tablet Commonly known as:  ZOFRAN ODT 44m ODT q4 hours prn nausea/vomit   pioglitazone 15 MG tablet Commonly known as:  ACTOS Take 15 mg by mouth daily.   VITAMIN B COMPLEX PO Take 1 tablet by mouth daily.      Allergies  Allergen Reactions  . Hydroxychloroquine Hives and Rash    All over All over    Follow-up Information    MLyman Bishop DO. Schedule an appointment as soon as possible for a visit in 2 week(s).   Specialty:  Family Medicine Contact information: [place] [phone]        ELaurence Spates MD. Schedule an appointment as soon as possible for a visit in 3 week(s).   Specialty:  Gastroenterology Why:  f/u in 2-3 weeks. Contact information: [address] [place] [phone]           The results of significant diagnostics from this hospitalization (including imaging,  microbiology, ancillary and laboratory) are listed below for reference.    Significant Diagnostic Studies: Mr Abdomen Mrcp Wo Contrast  Result Date: 05/04/2018 CLINICAL DATA:  78year old female with history of recurrent pancreatitis. EXAM: MRI ABDOMEN WITHOUT CONTRAST  (INCLUDING MRCP) TECHNIQUE: Multiplanar multisequence MR imaging of the abdomen was performed. Heavily T2-weighted images of the biliary and pancreatic ducts were obtained, and three-dimensional MRCP images were rendered by post processing. COMPARISON:  Abdominal MRI 12/29/2017. CT of the abdomen and pelvis 11/17/2017. FINDINGS: Lower chest: Unremarkable. Hepatobiliary: No suspicious cystic or solid hepatic lesions. Status post cholecystectomy. No intrahepatic biliary ductal dilatation noted on MRCP images. MRCP images demonstrate 7 mm common bile duct in the porta hepatis, likely reflective of benign post cholecystectomy physiology. No filling defect in the common bile duct to suggest choledocholithiasis. Pancreas: No pancreatic mass. No pancreatic ductal dilatation noted on MRCP images. No  pancreatic or peripancreatic fluid or inflammatory changes. Spleen:  Unremarkable. Adrenals/Urinary Tract: Bilateral kidneys and bilateral adrenal glands are normal in appearance. No hydroureteronephrosis in the visualized portions of the abdomen. Stomach/Bowel: Visualized portions are unremarkable. Vascular/Lymphatic: Aortic atherosclerosis, without evidence of aneurysm in the abdominal vasculature. No lymphadenopathy noted in the abdomen. Other: No significant volume of ascites noted in the visualized portions of the abdomen. Musculoskeletal: No aggressive appearing osseous lesions are noted in the visualized portions of the skeleton. IMPRESSION: 1. No findings to account for the patient's history of recurrent pancreatitis. 2. Aortic atherosclerosis. Electronically Signed   By: Vinnie Langton M.D.   On: 05/04/2018 10:39   Mr 3d Recon At  Scanner  Result Date: 05/04/2018 CLINICAL DATA:  78 year old female with history of recurrent pancreatitis. EXAM: MRI ABDOMEN WITHOUT CONTRAST  (INCLUDING MRCP) TECHNIQUE: Multiplanar multisequence MR imaging of the abdomen was performed. Heavily T2-weighted images of the biliary and pancreatic ducts were obtained, and three-dimensional MRCP images were rendered by post processing. COMPARISON:  Abdominal MRI 12/29/2017. CT of the abdomen and pelvis 11/17/2017. FINDINGS: Lower chest: Unremarkable. Hepatobiliary: No suspicious cystic or solid hepatic lesions. Status post cholecystectomy. No intrahepatic biliary ductal dilatation noted on MRCP images. MRCP images demonstrate 7 mm common bile duct in the porta hepatis, likely reflective of benign post cholecystectomy physiology. No filling defect in the common bile duct to suggest choledocholithiasis. Pancreas: No pancreatic mass. No pancreatic ductal dilatation noted on MRCP images. No pancreatic or peripancreatic fluid or inflammatory changes. Spleen:  Unremarkable. Adrenals/Urinary Tract: Bilateral kidneys and bilateral adrenal glands are normal in appearance. No hydroureteronephrosis in the visualized portions of the abdomen. Stomach/Bowel: Visualized portions are unremarkable. Vascular/Lymphatic: Aortic atherosclerosis, without evidence of aneurysm in the abdominal vasculature. No lymphadenopathy noted in the abdomen. Other: No significant volume of ascites noted in the visualized portions of the abdomen. Musculoskeletal: No aggressive appearing osseous lesions are noted in the visualized portions of the skeleton. IMPRESSION: 1. No findings to account for the patient's history of recurrent pancreatitis. 2. Aortic atherosclerosis. Electronically Signed   By: Vinnie Langton M.D.   On: 05/04/2018 10:39   US Abdomen Limited Ruq  Result Date: 05/03/2018 CLINICAL DATA:  Initial evaluation for recurrent pancreatitis, elevated LFTs EXAM: ULTRASOUND ABDOMEN LIMITED  RIGHT UPPER QUADRANT COMPARISON:  None. FINDINGS: Gallbladder: Surgically absent. Common bile duct: Diameter: 8 mm, within normal limits for age and post cholecystectomy changes. Liver: No focal lesion identified. Within normal limits in parenchymal echogenicity. Portal vein is patent on color Doppler imaging with normal direction of blood flow towards the liver. IMPRESSION: 1. Prior cholecystectomy. 2. Otherwise unremarkable right upper quadrant ultrasound. No biliary dilatation Electronically Signed   By: Jeannine Boga M.D.   On: 05/03/2018 04:04    Microbiology: No results found for this or any previous visit (from the past 240 hour(s)).   Labs: Basic Metabolic Panel: Recent Labs  Lab 05/02/18 2312 05/04/18 0334 05/05/18 0340  NA 134* 138 138  K 3.8 3.7 3.6  CL 98 107 106  CO2 '25 25 25  ' GLUCOSE 167* 131* 145*  BUN 13 5* 8  CREATININE 0.70 0.68 0.78  CALCIUM 8.9 8.4* 8.5*   Liver Function Tests: Recent Labs  Lab 05/02/18 2312 05/04/18 0334 05/05/18 0340  AST 172* 59* 34  ALT 58* 59* 44  ALKPHOS 135* 106 93  BILITOT 0.7 0.6 0.5  PROT 7.0 5.8* 5.8*  ALBUMIN 4.0 3.2* 3.3*   Recent Labs  Lab 05/02/18 2312  LIPASE 110*  No results for input(s): AMMONIA in the last 168 hours. CBC: Recent Labs  Lab 05/02/18 2312 05/04/18 0334  WBC 6.7 5.3  NEUTROABS 4.5  --   HGB 12.7 11.6*  HCT 39.3 37.1  MCV 92.5 94.2  PLT 233 186   Cardiac Enzymes: No results for input(s): CKTOTAL, CKMB, CKMBINDEX, TROPONINI in the last 168 hours. BNP: BNP (last 3 results) No results for input(s): BNP in the last 8760 hours.  ProBNP (last 3 results) No results for input(s): PROBNP in the last 8760 hours.  CBG: Recent Labs  Lab 05/04/18 1207 05/04/18 1641 05/04/18 1942 05/05/18 0805 05/05/18 1204  GLUCAP 198* 201* 113* 142* 147*       Signed:  Irine Seal MD.  [facility] 05/05/2018, 12:30 PM

## 2018-05-20 DIAGNOSIS — M25562 Pain in left knee: Secondary | ICD-10-CM | POA: Insufficient documentation

## 2018-07-21 ENCOUNTER — Ambulatory Visit: Payer: Self-pay | Admitting: Rheumatology

## 2019-02-28 DIAGNOSIS — E1169 Type 2 diabetes mellitus with other specified complication: Secondary | ICD-10-CM | POA: Insufficient documentation

## 2019-02-28 DIAGNOSIS — E785 Hyperlipidemia, unspecified: Secondary | ICD-10-CM | POA: Insufficient documentation

## 2019-02-28 DIAGNOSIS — B001 Herpesviral vesicular dermatitis: Secondary | ICD-10-CM | POA: Insufficient documentation

## 2019-08-24 DIAGNOSIS — E1169 Type 2 diabetes mellitus with other specified complication: Secondary | ICD-10-CM | POA: Insufficient documentation

## 2019-09-08 NOTE — Patient Instructions (Addendum)
DUE TO COVID-19 ONLY ONE VISITOR IS ALLOWED TO COME WITH YOU AND STAY IN THE WAITING ROOM ONLY DURING PRE OP AND PROCEDURE DAY OF SURGERY. THE 2 VISITOR MAY VISIT WITH YOU AFTER SURGERY IN YOUR PRIVATE ROOM DURING VISITING HOURS ONLY!  YOU NEED TO HAVE A COVID 19 TEST ON_7/8/21______ @_1 :45______, THIS TEST MUST BE DONE BEFORE SURGERY, COME  [address], [place] , [address].  ([place]) ONCE YOUR COVID TEST IS COMPLETED, PLEASE BEGIN THE QUARANTINE INSTRUCTIONS AS OUTLINED IN YOUR HANDOUT.                Ashley Perkins   Your procedure is scheduled on: 09/25/19   Report to [facility] Main  Entrance   Report to admitting at  11:15 AM     Call this number if you have problems the morning of surgery [phone]   . BRUSH YOUR TEETH MORNING OF SURGERY AND RINSE YOUR MOUTH OUT, NO CHEWING GUM CANDY OR MINTS.   Do not eat food After Midnight.  YOU MAY HAVE CLEAR LIQUIDS FROM MIDNIGHT UNTIL 10:30 AM    CLEAR LIQUID DIET   Foods Allowed                                                                     Foods Excluded  Coffee and tea, regular and decaf                             liquids that you cannot  Plain Jell-O any favor except red or purple                                           see through such as: Fruit ices (not with fruit pulp)                                     milk, soups, orange juice  Iced Popsicles                                    All solid food Carbonated beverages, regular and diet                                    Cranberry, grape and apple juices Sports drinks like Gatorade Lightly seasoned clear broth or consume(fat free) Sugar, honey syrup     . At 10:30 AM Please finish the prescribed Pre-Surgery Gatorade drink.   Nothing by mouth after you finish the Gatorade drink !   Take these medicines the morning of surgery with A SIP OF WATER: Gabapentin, Levothyroxine  DO NOT TAKE ANY DIABETIC MEDICATIONS DAY OF YOUR  SURGERY    How to Manage Your Diabetes Before and After Surgery  Why is it important to control my blood sugar before and after surgery? . Improving blood sugar levels before and  after surgery helps healing and can limit problems. . A way of improving blood sugar control is eating a healthy diet by: o  Eating less sugar and carbohydrates o  Increasing activity/exercise o  Talking with your doctor about reaching your blood sugar goals . High blood sugars (greater than 180 mg/dL) can raise your risk of infections and slow your recovery, so you will need to focus on controlling your diabetes during the weeks before surgery. . Make sure that the doctor who takes care of your diabetes knows about your planned surgery including the date and location.  How do I manage my blood sugar before surgery? . Check your blood sugar at least 4 times a day, starting 2 days before surgery, to make sure that the level is not too high or low. o Check your blood sugar the morning of your surgery when you wake up and every 2 hours until you get to the Short Stay unit. . If your blood sugar is less than 70 mg/dL, you will need to treat for low blood sugar: o Do not take insulin. o Treat a low blood sugar (less than 70 mg/dL) with  cup of clear juice (cranberry or apple), 4 glucose tablets, OR glucose gel. o Recheck blood sugar in 15 minutes after treatment (to make sure it is greater than 70 mg/dL). If your blood sugar is not greater than 70 mg/dL on recheck, call [phone] for further instructions. . Report your blood sugar to the short stay nurse when you get to Short Stay.  . If you are admitted to the hospital after surgery: o Your blood sugar will be checked by the staff and you will probably be given insulin after surgery (instead of oral diabetes medicines) to make sure you have good blood sugar levels. o The goal for blood sugar control after surgery is 80-180 mg/dL.   WHAT DO I DO ABOUT MY DIABETES  MEDICATION?  Marland Kitchen Do not take oral diabetes medicines (pills) the morning of surgery.                You may not have any metal on your body including hair pins and              piercings  Do not wear jewelry, make-up, lotions, powders or perfumes, deodorant             Do not wear nail polish on your fingernails.  Do not shave  48 hours prior to surgery.              Do not bring valuables to the hospital. [place].  Contacts, dentures or bridgework may not be worn into surgery.       Patients discharged the day of surgery will not be allowed to drive home. IF YOU ARE HAVING SURGERY AND GOING HOME THE SAME DAY, YOU MUST HAVE AN ADULT TO DRIVE YOU HOME AND BE WITH YOU FOR 24 HOURS. YOU MAY GO HOME BY TAXI OR UBER OR ORTHERWISE, BUT AN ADULT MUST ACCOMPANY YOU HOME AND STAY WITH YOU FOR 24 HOURS.  Name and phone number of your driver:  Special Instructions: N/A              Please read over the following fact sheets you were given: _____________________________________________________________________             [facility] -  Preparing for Surgery Before surgery, you can play an important role.   Because skin is not sterile, your skin needs to be as free of germs as possible .  You can reduce the number of germs on your skin by washing with CHG (chlorahexidine gluconate) soap before surgery .  CHG is an antiseptic cleaner which kills germs and bonds with the skin to continue killing germs even after washing. Please DO NOT use if you have an allergy to CHG or antibacterial soaps .  If your skin becomes reddened/irritated stop using the CHG and inform your nurse when you arrive at Short Stay. Do not shave (including legs and underarms) for at least 48 hours prior to the first CHG shower.  . Please follow these instructions carefully:  1.  Shower with CHG Soap the night before surgery and the  morning of Surgery.  2.  If you choose to  wash your hair, wash your hair first as usual with your  normal  shampoo.  3.  After you shampoo, rinse your hair and body thoroughly to remove the  shampoo.                                        4.  Use CHG as you would any other liquid soap.  You can apply chg directly  to the skin and wash                       Gently with a scrungie or clean washcloth.  5.  Apply the CHG Soap to your body ONLY FROM THE NECK DOWN.   Do not use on face/ open                           Wound or open sores. Avoid contact with eyes, ears mouth and genitals (private parts).                       Wash face,  Genitals (private parts) with your normal soap.             6.  Wash thoroughly, paying special attention to the area where your surgery  will be performed.  7.  Thoroughly rinse your body with warm water from the neck down.  8.  DO NOT shower/wash with your normal soap after using and rinsing off  the CHG Soap.             9.  Pat yourself dry with a clean towel.            10.  Wear clean pajamas.            11.  Place clean sheets on your bed the night of your first shower and do not  sleep with pets. Day of Surgery : Do not apply any lotions/deodorants the morning of surgery.  Please wear clean clothes to the hospital/surgery center.  FAILURE TO FOLLOW THESE INSTRUCTIONS MAY RESULT IN THE CANCELLATION OF YOUR SURGERY PATIENT SIGNATURE_________________________________  NURSE SIGNATURE__________________________________  ________________________________________________________________________   Adam Phenix  An incentive spirometer is a tool that can help keep your lungs clear and active. This tool measures how well you are filling your lungs with each breath. Taking long deep breaths may help reverse or decrease the chance of developing breathing (pulmonary) problems (especially infection)  following:  A long period of time when you are unable to move or be active. BEFORE THE PROCEDURE   If  the spirometer includes an indicator to show your best effort, your nurse or respiratory therapist will set it to a desired goal.  If possible, sit up straight or lean slightly forward. Try not to slouch.  Hold the incentive spirometer in an upright position. INSTRUCTIONS FOR USE  1. Sit on the edge of your bed if possible, or sit up as far as you can in bed or on a chair. 2. Hold the incentive spirometer in an upright position. 3. Breathe out normally. 4. Place the mouthpiece in your mouth and seal your lips tightly around it. 5. Breathe in slowly and as deeply as possible, raising the piston or the ball toward the top of the column. 6. Hold your breath for 3-5 seconds or for as long as possible. Allow the piston or ball to fall to the bottom of the column. 7. Remove the mouthpiece from your mouth and breathe out normally. 8. Rest for a few seconds and repeat Steps 1 through 7 at least 10 times every 1-2 hours when you are awake. Take your time and take a few normal breaths between deep breaths. 9. The spirometer may include an indicator to show your best effort. Use the indicator as a goal to work toward during each repetition. 10. After each set of 10 deep breaths, practice coughing to be sure your lungs are clear. If you have an incision (the cut made at the time of surgery), support your incision when coughing by placing a pillow or rolled up towels firmly against it. Once you are able to get out of bed, walk around indoors and cough well. You may stop using the incentive spirometer when instructed by your caregiver.  RISKS AND COMPLICATIONS  Take your time so you do not get dizzy or light-headed.  If you are in pain, you may need to take or ask for pain medication before doing incentive spirometry. It is harder to take a deep breath if you are having pain. AFTER USE  Rest and breathe slowly and easily.  It can be helpful to keep track of a log of your progress. Your caregiver can  provide you with a simple table to help with this. If you are using the spirometer at home, follow these instructions: [place] IF:   You are having difficultly using the spirometer.  You have trouble using the spirometer as often as instructed.  Your pain medication is not giving enough relief while using the spirometer.  You develop fever of 100.5 F (38.1 C) or higher. SEEK IMMEDIATE MEDICAL CARE IF:   You cough up bloody sputum that had not been present before.  You develop fever of 102 F (38.9 C) or greater.  You develop worsening pain at or near the incision site. MAKE SURE YOU:   Understand these instructions.  Will watch your condition.  Will get help right away if you are not doing well or get worse. Document Released: 07/13/2006 Document Revised: 05/25/2011 Document Reviewed: 09/13/2006 [facility] Patient Information 2014 [place], [place].   ________________________________________________________________________

## 2019-09-12 ENCOUNTER — Encounter (HOSPITAL_COMMUNITY): Payer: Self-pay

## 2019-09-12 ENCOUNTER — Encounter (HOSPITAL_COMMUNITY)
Admission: RE | Admit: 2019-09-12 | Discharge: 2019-09-12 | Disposition: A | Discharge status: Home / Self Care | Payer: Medicare HMO | Source: Ambulatory Visit | Attending: Orthopedic Surgery | Admitting: Orthopedic Surgery

## 2019-09-12 ENCOUNTER — Other Ambulatory Visit: Payer: Self-pay

## 2019-09-12 DIAGNOSIS — Z01812 Encounter for preprocedural laboratory examination: Secondary | ICD-10-CM | POA: Diagnosis not present

## 2019-09-12 HISTORY — DX: Nausea with vomiting, unspecified: R11.2

## 2019-09-12 HISTORY — DX: Other complications of anesthesia, initial encounter: T88.59XA

## 2019-09-12 HISTORY — DX: Other specified postprocedural states: Z98.890

## 2019-09-12 HISTORY — DX: Hypothyroidism, unspecified: E03.9

## 2019-09-12 HISTORY — DX: Unspecified osteoarthritis, unspecified site: M19.90

## 2019-09-12 LAB — CBC
HCT: 41.1 % (ref 36.0–46.0)
Hemoglobin: 13.2 g/dL (ref 12.0–15.0)
MCH: 30.5 pg (ref 26.0–34.0)
MCHC: 32.1 g/dL (ref 30.0–36.0)
MCV: 94.9 fL (ref 80.0–100.0)
Platelets: 241 10*3/uL (ref 150–400)
RBC: 4.33 MIL/uL (ref 3.87–5.11)
RDW: 13.5 % (ref 11.5–15.5)
WBC: 6.7 10*3/uL (ref 4.0–10.5)
nRBC: 0 % (ref 0.0–0.2)

## 2019-09-12 LAB — COMPREHENSIVE METABOLIC PANEL
ALT: 13 U/L (ref 0–44)
AST: 20 U/L (ref 15–41)
Albumin: 3.7 g/dL (ref 3.5–5.0)
Alkaline Phosphatase: 76 U/L (ref 38–126)
Anion gap: 8 (ref 5–15)
BUN: 12 mg/dL (ref 8–23)
CO2: 30 mmol/L (ref 22–32)
Calcium: 9 mg/dL (ref 8.9–10.3)
Chloride: 100 mmol/L (ref 98–111)
Creatinine, Ser: 0.84 mg/dL (ref 0.44–1.00)
GFR calc Af Amer: >60 mL/min (ref >60)
GFR calc non Af Amer: >60 mL/min (ref >60)
Glucose, Bld: 167 mg/dL — ABNORMAL HIGH (ref 70–99)
Potassium: 4.8 mmol/L (ref 3.5–5.1)
Sodium: 138 mmol/L (ref 135–145)
Total Bilirubin: 0.5 mg/dL (ref 0.3–1.2)
Total Protein: 6.7 g/dL (ref 6.5–8.1)

## 2019-09-12 LAB — SURGICAL PCR SCREEN
MRSA, PCR: NEGATIVE
Staphylococcus aureus: NEGATIVE

## 2019-09-12 LAB — TYPE AND SCREEN
ABO/RH(D): O POS
Antibody Screen: NEGATIVE

## 2019-09-12 LAB — PROTIME-INR
INR: 1 (ref 0.8–1.2)
Prothrombin Time: 12.7 seconds (ref 11.4–15.2)

## 2019-09-12 LAB — APTT: aPTT: 31 seconds (ref 24–36)

## 2019-09-12 NOTE — Progress Notes (Signed)
COVID Vaccine Completed:Yes Date COVID Vaccine completed:05/27/19 COVID vaccine manufacturer:   Moderna     PCP -Tobie Lords  Cardiologist - no  Chest x-ray - no EKG -  Stress Test - no ECHO - no Cardiac Cath - no  Sleep Study - no CPAP -   Fasting Blood Sugar - 150-170 Checks Blood Sugar __BID___ times a day  Blood Thinner Instructions:NA Aspirin Instructions: Last Dose:  Anesthesia review:   Patient denies shortness of breath, fever, cough and chest pain at PAT appointment yes   Patient verbalized understanding of instructions that were given to them at the PAT appointment. Patient was also instructed that they will need to review over the PAT instructions again at home before surgery. Yes  Pt did water aerobics for years. She has no SOB with stairs or ADLs.

## 2019-09-13 NOTE — H&P (Signed)
TOTAL KNEE ADMISSION H&P  Patient is being admitted for left total knee arthroplasty.  Subjective:  Chief Complaint: Left knee pain.  HPI: Ashley Perkins, 79 y.o. female has a history of pain and functional disability in the left knee due to arthritis and has failed non-surgical conservative treatments for greater than 12 weeks to include corticosteriod injections, viscosupplementation injections and activity modification. Onset of symptoms was gradual, starting several years ago with gradually worsening course since that time. The patient noted no past surgery on the left knee.  Patient currently rates pain in the left knee at 8 out of 10 with activity. Patient has worsening of pain with activity and weight bearing, pain that interferes with activities of daily living, crepitus and joint swelling. Patient has evidence of bone-on-bone arthritis in the lateral compartment of the left knee with patellofemoral narrowing by imaging studies. There is no active infection.  Patient Active Problem List   Diagnosis Date Noted   Elevated liver enzymes    Recurrent acute pancreatitis 05/03/2018   PMR (polymyalgia rheumatica) (HCC) 05/03/2018   Pancreatitis 05/03/2018   Fibromyalgia 07/15/2017   Primary osteoarthritis of both hands 07/15/2017   Primary osteoarthritis of both knees 07/15/2017   DDD (degenerative disc disease), cervical 07/15/2017   DDD (degenerative disc disease), lumbar 07/15/2017   History of knee surgery 07/15/2017   Elevated liver function tests    Generalized abdominal pain    Depression    Acute pancreatitis 05/07/2014   Transaminitis 05/07/2014   DM2 (diabetes mellitus, type 2) ([facility]) 05/07/2014    Past Medical History:  Diagnosis Date   Arthritis    knees,   Complication of anesthesia    Diabetes mellitus without complication (HCC)    Fibromyalgia    Hypercholesteremia    Hypothyroidism    PONV (postoperative nausea and vomiting)     Past  Surgical History:  Procedure Laterality Date   BACK SURGERY     CHOLECYSTECTOMY     KNEE ARTHROSCOPY  06/07/2017   LAPAROSCOPIC UNILATERAL SALPINGO OOPHERECTOMY     TUBAL LIGATION      Prior to Admission medications   Medication Sig Start Date End Date Taking? Authorizing Provider  amitriptyline (ELAVIL) 75 MG tablet Take 75 mg by mouth at bedtime.   Yes [provider]  B Complex Vitamins (VITAMIN B COMPLEX PO) Take 1 tablet by mouth daily.   Yes [provider]  cholecalciferol (VITAMIN D3) 25 MCG (1000 UNIT) tablet Take 1,000 Units by mouth daily.   Yes [provider]  gabapentin (NEURONTIN) 300 MG capsule Take 300 mg by mouth 3 (three) times daily. 04/06/18  Yes [provider]  glimepiride (AMARYL) 2 MG tablet Take 2 mg by mouth daily with breakfast.  11/05/16  Yes [provider]  HYDROcodone-acetaminophen (NORCO/VICODIN) 5-325 MG tablet Take 0.5 tablets by mouth every 8 (eight) hours as needed for moderate pain.   Yes [provider]  levothyroxine (SYNTHROID, LEVOTHROID) 88 MCG tablet Take 88 mcg by mouth daily before breakfast. 02/13/14  Yes [provider]  metFORMIN (GLUCOPHAGE) 500 MG tablet Take 1,000 mg by mouth 2 (two) times daily with a meal.    Yes [provider]  Multiple Vitamin (MULTIVITAMIN WITH MINERALS) TABS Take 1 tablet by mouth daily.   Yes [provider]  pioglitazone (ACTOS) 15 MG tablet Take 15 mg by mouth daily.  11/05/16  Yes [provider]    Allergies  Allergen Reactions   Hydroxychloroquine Hives and Rash  All over      Social History   Socioeconomic History   Marital status: Married    Spouse name: Not on file   Number of children: Not on file   Years of education: Not on file   Highest education level: Not on file  Occupational History   Not on file  Tobacco Use   Smoking status: Never Smoker   Smokeless tobacco: Never Used  Vaping  Use   Vaping Use: Never used  Substance and Sexual Activity   Alcohol use: No   Drug use: No   Sexual activity: Yes  Other Topics Concern   Not on file  Social History Narrative   Not on file   Social Determinants of Health   Financial Resource Strain:    Difficulty of Paying Living Expenses:   Food Insecurity:    Worried About Charity fundraiser in the Last Year:    Arboriculturist in the Last Year:   Transportation Needs:    Film/video editor (Medical):    Lack of Transportation (Non-Medical):   Physical Activity:    Days of Exercise per Week:    Minutes of Exercise per Session:   Stress:    Feeling of Stress :   Social Connections:    Frequency of Communication with Friends and Family:    Frequency of Social Gatherings with Friends and Family:    Attends Religious Services:    Active Member of Clubs or Organizations:    Attends Archivist Meetings:    Marital Status:   Intimate Partner Violence:    Fear of Current or Ex-Partner:    Emotionally Abused:    Physically Abused:    Sexually Abused:       Tobacco Use: Low Risk    Smoking Tobacco Use: Never Smoker   Smokeless Tobacco Use: Never Used   Social History   Substance and Sexual Activity  Alcohol Use No    Family History  Problem Relation Age of Onset   Dementia Mother    Diabetes Father    Cancer Father    Heart Problems Father    Cancer Brother        prostate   Heart Problems Brother     Review of Systems  Constitutional: Negative for chills and fever.  HENT: Negative for congestion, sore throat and tinnitus.   Eyes: Negative for double vision, photophobia and pain.  Respiratory: Negative for cough, shortness of breath and wheezing.   Cardiovascular: Negative for chest pain, palpitations and orthopnea.  Gastrointestinal: Negative for heartburn, nausea and vomiting.  Genitourinary: Negative for dysuria, frequency and urgency.  Musculoskeletal:  Positive for joint pain.  Neurological: Negative for dizziness, weakness and headaches.    Objective:  Physical Exam: Well nourished and well developed.  General: Alert and oriented x3, cooperative and pleasant, no acute distress.  Head: normocephalic, atraumatic, neck supple.  Eyes: EOMI.  Respiratory: breath sounds clear in all fields, no wheezing, rales, or rhonchi. Cardiovascular: Regular rate and rhythm, no murmurs, gallops or rubs.  Abdomen: non-tender to palpation and soft, normoactive bowel sounds. Musculoskeletal:  Left Knee Exam:  Large effusion.  Significant varus/valgus laxity in that her valgus deformity converts back to neutral  Range of motion is 5 to 130 degrees.  No AP laxity.  Calves soft and nontender. Motor function intact in LE. Strength 5/5 LE bilaterally. Neuro: Distal pulses 2+. Sensation to light touch intact in LE.  Vital signs in last 24  hours: Temp:  [98.4 F (36.9 C)] 98.4 F (36.9 C) (06/29 1155) Pulse Rate:  [80] 80 (06/29 1155) Resp:  [18] 18 (06/29 1155) BP: (128)/(63) 128/63 (06/29 1155) SpO2:  [98 %] 98 % (06/29 1155) Weight:  [83.5 kg] 83.5 kg (06/29 1155)  Imaging Review Plain radiographs demonstrate severe degenerative joint disease of the left knee. The overall alignment is neutral. The bone quality appears to be adequate for age and reported activity level.  Assessment/Plan:  End stage arthritis, left knee   The patient history, physical examination, clinical judgment of the provider and imaging studies are consistent with end stage degenerative joint disease of the left knee and total knee arthroplasty is deemed medically necessary. The treatment options including medical management, injection therapy arthroscopy and arthroplasty were discussed at length. The risks and benefits of total knee arthroplasty were presented and reviewed. The risks due to aseptic loosening, infection, stiffness, patella tracking problems, thromboembolic  complications and other imponderables were discussed. The patient acknowledged the explanation, agreed to proceed with the plan and consent was signed. Patient is being admitted for inpatient treatment for surgery, pain control, PT, OT, prophylactic antibiotics, VTE prophylaxis, progressive ambulation and ADLs and discharge planning. The patient is planning to be discharged home.   Patient's anticipated LOS is less than 2 midnights, meeting these requirements: - Lives within 1 hour of care - Has a competent adult at home to recover with post-op recover - NO history of  - Chronic pain requiring opiods  - Coronary Artery Disease  - Heart failure  - Heart attack  - Stroke  - DVT/VTE  - Cardiac arrhythmia  - Respiratory Failure/COPD  - Renal failure  - Anemia  - Advanced Liver disease  Therapy Plans: Outpatient therapy at [facility] Disposition: Home with husband Planned DVT Prophylaxis: Aspirin 325 mg BID DME Needed: Gilford Rile, 3-in-1 PCP: Tobie Lords, FNP (clearance provided) TXA: IV Allergies: Plaquenil (hives) Anesthesia Concerns: Nausea BMI: 27.7 Last HgbA1c: 6.8% (08/24/2019)  Other: - Takes Norco 5-325 mg TID, prescribed 90 tabs on 6/18 by Elfredia Nevins, FNP  - Patient was instructed on what medications to stop prior to surgery. - Follow-up visit in 2 weeks with Dr. Wynelle Link - Begin physical therapy following surgery - Pre-operative lab work as pre-surgical testing - Prescriptions will be provided in hospital at time of discharge  Theresa Duty, PA-C Orthopedic Surgery [facility] [place]

## 2019-09-21 ENCOUNTER — Other Ambulatory Visit (HOSPITAL_COMMUNITY)
Admission: RE | Admit: 2019-09-21 | Discharge: 2019-09-21 | Disposition: A | Discharge status: Home / Self Care | Payer: Medicare HMO | Source: Ambulatory Visit | Attending: Orthopedic Surgery | Admitting: Orthopedic Surgery

## 2019-09-21 DIAGNOSIS — Z01812 Encounter for preprocedural laboratory examination: Secondary | ICD-10-CM | POA: Diagnosis present

## 2019-09-21 DIAGNOSIS — Z20822 Contact with and (suspected) exposure to covid-19: Secondary | ICD-10-CM | POA: Diagnosis not present

## 2019-09-21 LAB — SARS CORONAVIRUS 2 (TAT 6-24 HRS): SARS Coronavirus 2: NEGATIVE

## 2019-09-24 MED ORDER — BUPIVACAINE LIPOSOME 1.3 % IJ SUSP
20.0000 mL | Freq: Once | INTRAMUSCULAR | Status: DC
  Filled 2019-09-24: qty 20

## 2019-09-24 NOTE — Anesthesia Preprocedure Evaluation (Addendum)
Anesthesia Evaluation  Patient identified by MRN, date of birth, ID band Patient awake    Reviewed: Allergy & Precautions, NPO status , Patient's Chart, lab work & pertinent test results  History of Anesthesia Complications (+) PONV and history of anesthetic complications (mild)  Airway Mallampati: II  TM Distance: >3 FB Neck ROM: Full    Dental no notable dental hx. (+) Dental Advisory Given,    Pulmonary neg pulmonary ROS,    Pulmonary exam normal breath sounds clear to auscultation       Cardiovascular negative cardio ROS Normal cardiovascular exam Rhythm:Regular Rate:Normal     Neuro/Psych PSYCHIATRIC DISORDERS Depression negative neurological ROS     GI/Hepatic negative GI ROS, Neg liver ROS,   Endo/Other  diabetes, Well Controlled, Type 2, Oral Hypoglycemic AgentsHypothyroidism   Renal/GU negative Renal ROS  negative genitourinary   Musculoskeletal  (+) Arthritis , Osteoarthritis,  Fibromyalgia -, narcotic dependentHydrocodone for chronic LBP- rarely uses 1 back surgery in past   Abdominal   Peds  Hematology negative hematology ROS (+) hct 41, plt 241   Anesthesia Other Findings   Reproductive/Obstetrics negative OB ROS                            Anesthesia Physical Anesthesia Plan  ASA: III  Anesthesia Plan: Spinal, Regional and MAC   Post-op Pain Management:  Regional for Post-op pain   Induction:   PONV Risk Score and Plan: 2 and Propofol infusion and TIVA  Airway Management Planned: Natural Airway and Nasal Cannula  Additional Equipment: None  Intra-op Plan:   Post-operative Plan:   Informed Consent: I have reviewed the patients History and Physical, chart, labs and discussed the procedure including the risks, benefits and alternatives for the proposed anesthesia with the patient or authorized representative who has indicated his/her understanding and acceptance.        Plan Discussed with: CRNA  Anesthesia Plan Comments:        Anesthesia Quick Evaluation

## 2019-09-25 ENCOUNTER — Ambulatory Visit (HOSPITAL_COMMUNITY): Payer: Medicare HMO | Admitting: Certified Registered"

## 2019-09-25 ENCOUNTER — Ambulatory Visit (HOSPITAL_COMMUNITY)
Admission: RE | Admit: 2019-09-25 | Discharge: 2019-09-26 | Disposition: A | Discharge status: Home / Self Care | Payer: Medicare HMO | Attending: Orthopedic Surgery | Admitting: Orthopedic Surgery

## 2019-09-25 ENCOUNTER — Encounter (HOSPITAL_COMMUNITY): Payer: Self-pay | Admitting: Orthopedic Surgery

## 2019-09-25 ENCOUNTER — Encounter (HOSPITAL_COMMUNITY)
Admission: RE | Disposition: A | Discharge status: Home / Self Care | Payer: Self-pay | Source: Home / Self Care | Attending: Orthopedic Surgery

## 2019-09-25 ENCOUNTER — Other Ambulatory Visit: Payer: Self-pay

## 2019-09-25 DIAGNOSIS — Z7984 Long term (current) use of oral hypoglycemic drugs: Secondary | ICD-10-CM | POA: Diagnosis not present

## 2019-09-25 DIAGNOSIS — M5030 Other cervical disc degeneration, unspecified cervical region: Secondary | ICD-10-CM | POA: Diagnosis not present

## 2019-09-25 DIAGNOSIS — M1712 Unilateral primary osteoarthritis, left knee: Secondary | ICD-10-CM | POA: Insufficient documentation

## 2019-09-25 DIAGNOSIS — E7800 Pure hypercholesterolemia, unspecified: Secondary | ICD-10-CM | POA: Diagnosis not present

## 2019-09-25 DIAGNOSIS — M353 Polymyalgia rheumatica: Secondary | ICD-10-CM | POA: Diagnosis not present

## 2019-09-25 DIAGNOSIS — Z79890 Hormone replacement therapy (postmenopausal): Secondary | ICD-10-CM | POA: Insufficient documentation

## 2019-09-25 DIAGNOSIS — M179 Osteoarthritis of knee, unspecified: Secondary | ICD-10-CM | POA: Diagnosis present

## 2019-09-25 DIAGNOSIS — E119 Type 2 diabetes mellitus without complications: Secondary | ICD-10-CM | POA: Diagnosis not present

## 2019-09-25 DIAGNOSIS — E039 Hypothyroidism, unspecified: Secondary | ICD-10-CM | POA: Insufficient documentation

## 2019-09-25 DIAGNOSIS — M797 Fibromyalgia: Secondary | ICD-10-CM | POA: Diagnosis not present

## 2019-09-25 DIAGNOSIS — Z79899 Other long term (current) drug therapy: Secondary | ICD-10-CM | POA: Diagnosis not present

## 2019-09-25 DIAGNOSIS — M1710 Unilateral primary osteoarthritis, unspecified knee: Secondary | ICD-10-CM | POA: Diagnosis present

## 2019-09-25 HISTORY — PX: TOTAL KNEE ARTHROPLASTY: SHX125

## 2019-09-25 LAB — COMPREHENSIVE METABOLIC PANEL
ALT: 13 U/L (ref 0–44)
AST: 18 U/L (ref 15–41)
Albumin: 3.3 g/dL — ABNORMAL LOW (ref 3.5–5.0)
Alkaline Phosphatase: 64 U/L (ref 38–126)
Anion gap: 7 (ref 5–15)
BUN: 9 mg/dL (ref 8–23)
CO2: 29 mmol/L (ref 22–32)
Calcium: 8.5 mg/dL — ABNORMAL LOW (ref 8.9–10.3)
Chloride: 101 mmol/L (ref 98–111)
Creatinine, Ser: 0.7 mg/dL (ref 0.44–1.00)
GFR calc Af Amer: >60 mL/min (ref >60)
GFR calc non Af Amer: >60 mL/min (ref >60)
Glucose, Bld: 160 mg/dL — ABNORMAL HIGH (ref 70–99)
Potassium: 3.9 mmol/L (ref 3.5–5.1)
Sodium: 137 mmol/L (ref 135–145)
Total Bilirubin: 0.4 mg/dL (ref 0.3–1.2)
Total Protein: 5.9 g/dL — ABNORMAL LOW (ref 6.5–8.1)

## 2019-09-25 LAB — GLUCOSE, CAPILLARY
Glucose-Capillary: 160 mg/dL — ABNORMAL HIGH (ref 70–99)
Glucose-Capillary: 205 mg/dL — ABNORMAL HIGH (ref 70–99)
Glucose-Capillary: 242 mg/dL — ABNORMAL HIGH (ref 70–99)

## 2019-09-25 LAB — ABO/RH: ABO/RH(D): O POS

## 2019-09-25 LAB — HEMOGLOBIN A1C
Hgb A1c MFr Bld: 6.9 % — ABNORMAL HIGH (ref 4.8–5.6)
Mean Plasma Glucose: 151.33 mg/dL

## 2019-09-25 SURGERY — ARTHROPLASTY, KNEE, TOTAL
Anesthesia: Monitor Anesthesia Care | Site: Knee | Laterality: Left

## 2019-09-25 MED ORDER — PROPOFOL 10 MG/ML IV BOLUS
INTRAVENOUS | Status: DC | PRN
  Administered 2019-09-25: 30 mg via INTRAVENOUS

## 2019-09-25 MED ORDER — MORPHINE SULFATE (PF) 2 MG/ML IV SOLN
0.5000 mg | INTRAVENOUS | Status: DC | PRN
  Administered 2019-09-25: 1 mg via INTRAVENOUS
  Filled 2019-09-25: qty 1

## 2019-09-25 MED ORDER — PHENOL 1.4 % MT LIQD: 1.0000 | OROMUCOSAL | Status: DC | PRN

## 2019-09-25 MED ORDER — PROPOFOL 500 MG/50ML IV EMUL
INTRAVENOUS | Status: DC | PRN
  Administered 2019-09-25: 40 ug/kg/min via INTRAVENOUS

## 2019-09-25 MED ORDER — ORAL CARE MOUTH RINSE: 15.0000 mL | Freq: Once | OROMUCOSAL | Status: AC

## 2019-09-25 MED ORDER — TRANEXAMIC ACID-NACL 1000-0.7 MG/100ML-% IV SOLN
1000.0000 mg | INTRAVENOUS | Status: AC
  Administered 2019-09-25: 1000 mg via INTRAVENOUS
  Filled 2019-09-25: qty 100

## 2019-09-25 MED ORDER — METHOCARBAMOL 500 MG PO TABS
500.0000 mg | ORAL_TABLET | Freq: Four times a day (QID) | ORAL | Status: DC | PRN
  Administered 2019-09-25: 500 mg via ORAL
  Filled 2019-09-25: qty 1

## 2019-09-25 MED ORDER — BISACODYL 10 MG RE SUPP: 10.0000 mg | Freq: Every day | RECTAL | Status: DC | PRN

## 2019-09-25 MED ORDER — CEFAZOLIN SODIUM-DEXTROSE 2-4 GM/100ML-% IV SOLN
2.0000 g | INTRAVENOUS | Status: AC
  Administered 2019-09-25: 2 g via INTRAVENOUS
  Filled 2019-09-25: qty 100

## 2019-09-25 MED ORDER — TRAMADOL HCL 50 MG PO TABS: 50.0000 mg | ORAL_TABLET | Freq: Four times a day (QID) | ORAL | Status: DC | PRN

## 2019-09-25 MED ORDER — CEFAZOLIN SODIUM-DEXTROSE 2-4 GM/100ML-% IV SOLN
2.0000 g | Freq: Four times a day (QID) | INTRAVENOUS | Status: AC
  Administered 2019-09-25 (×2): 2 g via INTRAVENOUS
  Filled 2019-09-25 (×2): qty 100

## 2019-09-25 MED ORDER — ONDANSETRON HCL 4 MG/2ML IJ SOLN
INTRAMUSCULAR | Status: DC | PRN
  Administered 2019-09-25: 4 mg via INTRAVENOUS

## 2019-09-25 MED ORDER — ONDANSETRON HCL 4 MG/2ML IJ SOLN: 4.0000 mg | Freq: Four times a day (QID) | INTRAMUSCULAR | Status: DC | PRN

## 2019-09-25 MED ORDER — SODIUM CHLORIDE 0.9 % IR SOLN
Status: DC | PRN
  Administered 2019-09-25: 1000 mL

## 2019-09-25 MED ORDER — DIPHENHYDRAMINE HCL 12.5 MG/5ML PO ELIX: 12.5000 mg | ORAL_SOLUTION | ORAL | Status: DC | PRN

## 2019-09-25 MED ORDER — CHLORHEXIDINE GLUCONATE 0.12 % MT SOLN
15.0000 mL | Freq: Once | OROMUCOSAL | Status: AC
  Administered 2019-09-25: 15 mL via OROMUCOSAL

## 2019-09-25 MED ORDER — LIDOCAINE 2% (20 MG/ML) 5 ML SYRINGE
INTRAMUSCULAR | Status: DC | PRN
  Administered 2019-09-25: 40 mg via INTRAVENOUS

## 2019-09-25 MED ORDER — METOCLOPRAMIDE HCL 5 MG/ML IJ SOLN: 5.0000 mg | Freq: Three times a day (TID) | INTRAMUSCULAR | Status: DC | PRN

## 2019-09-25 MED ORDER — POVIDONE-IODINE 10 % EX SWAB
2.0000 "application " | Freq: Once | CUTANEOUS | Status: AC
  Administered 2019-09-25: 2 via TOPICAL

## 2019-09-25 MED ORDER — FENTANYL CITRATE (PF) 100 MCG/2ML IJ SOLN
INTRAMUSCULAR | Status: AC
  Filled 2019-09-25: qty 2

## 2019-09-25 MED ORDER — ACETAMINOPHEN 10 MG/ML IV SOLN
1000.0000 mg | Freq: Four times a day (QID) | INTRAVENOUS | Status: DC
  Administered 2019-09-25: 1000 mg via INTRAVENOUS
  Filled 2019-09-25: qty 100

## 2019-09-25 MED ORDER — MENTHOL 3 MG MT LOZG: 1.0000 | LOZENGE | OROMUCOSAL | Status: DC | PRN

## 2019-09-25 MED ORDER — DOCUSATE SODIUM 100 MG PO CAPS
100.0000 mg | ORAL_CAPSULE | Freq: Two times a day (BID) | ORAL | Status: DC
  Administered 2019-09-25 – 2019-09-26 (×2): 100 mg via ORAL
  Filled 2019-09-25 (×2): qty 1

## 2019-09-25 MED ORDER — DEXAMETHASONE SODIUM PHOSPHATE 10 MG/ML IJ SOLN
INTRAMUSCULAR | Status: DC | PRN
  Administered 2019-09-25: 10 mg

## 2019-09-25 MED ORDER — MEPIVACAINE HCL (PF) 2 % IJ SOLN
INTRAMUSCULAR | Status: DC | PRN
  Administered 2019-09-25: 3.2 mL via EPIDURAL

## 2019-09-25 MED ORDER — POLYETHYLENE GLYCOL 3350 17 G PO PACK: 17.0000 g | PACK | Freq: Every day | ORAL | Status: DC | PRN

## 2019-09-25 MED ORDER — MIDAZOLAM HCL 2 MG/2ML IJ SOLN
INTRAMUSCULAR | Status: DC | PRN
  Administered 2019-09-25 (×2): 1 mg via INTRAVENOUS

## 2019-09-25 MED ORDER — LIDOCAINE 2% (20 MG/ML) 5 ML SYRINGE
INTRAMUSCULAR | Status: AC
  Filled 2019-09-25: qty 5

## 2019-09-25 MED ORDER — SODIUM CHLORIDE (PF) 0.9 % IJ SOLN
INTRAMUSCULAR | Status: AC
  Filled 2019-09-25: qty 50

## 2019-09-25 MED ORDER — PROMETHAZINE HCL 25 MG/ML IJ SOLN: 6.2500 mg | INTRAMUSCULAR | Status: DC | PRN

## 2019-09-25 MED ORDER — MIDAZOLAM HCL 2 MG/2ML IJ SOLN
INTRAMUSCULAR | Status: AC
  Filled 2019-09-25: qty 2

## 2019-09-25 MED ORDER — LEVOTHYROXINE SODIUM 88 MCG PO TABS
88.0000 ug | ORAL_TABLET | Freq: Every day | ORAL | Status: DC
  Administered 2019-09-26: 88 ug via ORAL
  Filled 2019-09-25: qty 1

## 2019-09-25 MED ORDER — SODIUM CHLORIDE 0.9 % IV SOLN: INTRAVENOUS | Status: DC

## 2019-09-25 MED ORDER — PROPOFOL 10 MG/ML IV BOLUS
INTRAVENOUS | Status: AC
  Filled 2019-09-25: qty 20

## 2019-09-25 MED ORDER — LACTATED RINGERS IV SOLN: INTRAVENOUS | Status: DC

## 2019-09-25 MED ORDER — METHOCARBAMOL 500 MG IVPB - SIMPLE MED
500.0000 mg | Freq: Four times a day (QID) | INTRAVENOUS | Status: DC | PRN
  Filled 2019-09-25: qty 50

## 2019-09-25 MED ORDER — ACETAMINOPHEN 500 MG PO TABS
1000.0000 mg | ORAL_TABLET | Freq: Four times a day (QID) | ORAL | Status: AC
  Administered 2019-09-25 – 2019-09-26 (×4): 1000 mg via ORAL
  Filled 2019-09-25 (×4): qty 2

## 2019-09-25 MED ORDER — DEXAMETHASONE SODIUM PHOSPHATE 10 MG/ML IJ SOLN
INTRAMUSCULAR | Status: AC
  Filled 2019-09-25: qty 1

## 2019-09-25 MED ORDER — PIOGLITAZONE HCL 15 MG PO TABS
15.0000 mg | ORAL_TABLET | Freq: Every day | ORAL | Status: DC
  Filled 2019-09-25: qty 1

## 2019-09-25 MED ORDER — OXYCODONE HCL 5 MG PO TABS
5.0000 mg | ORAL_TABLET | ORAL | Status: DC | PRN
  Administered 2019-09-25 (×2): 5 mg via ORAL
  Administered 2019-09-26 (×3): 10 mg via ORAL
  Filled 2019-09-25: qty 2
  Filled 2019-09-25: qty 1
  Filled 2019-09-25 (×2): qty 2
  Filled 2019-09-25: qty 1

## 2019-09-25 MED ORDER — ONDANSETRON HCL 4 MG/2ML IJ SOLN
INTRAMUSCULAR | Status: AC
  Filled 2019-09-25: qty 2

## 2019-09-25 MED ORDER — FENTANYL CITRATE (PF) 100 MCG/2ML IJ SOLN
INTRAMUSCULAR | Status: AC
  Administered 2019-09-25: 50 ug
  Filled 2019-09-25: qty 2

## 2019-09-25 MED ORDER — INSULIN ASPART 100 UNIT/ML ~~LOC~~ SOLN
0.0000 [IU] | Freq: Every day | SUBCUTANEOUS | Status: DC
  Administered 2019-09-25: 2 [IU] via SUBCUTANEOUS

## 2019-09-25 MED ORDER — GLIMEPIRIDE 2 MG PO TABS
2.0000 mg | ORAL_TABLET | Freq: Every day | ORAL | Status: DC
  Administered 2019-09-26: 2 mg via ORAL
  Filled 2019-09-25: qty 1

## 2019-09-25 MED ORDER — BUPIVACAINE LIPOSOME 1.3 % IJ SUSP
INTRAMUSCULAR | Status: DC | PRN
  Administered 2019-09-25: 20 mL

## 2019-09-25 MED ORDER — SODIUM CHLORIDE (PF) 0.9 % IJ SOLN
INTRAMUSCULAR | Status: AC
  Filled 2019-09-25: qty 10

## 2019-09-25 MED ORDER — STERILE WATER FOR IRRIGATION IR SOLN
Status: DC | PRN
  Administered 2019-09-25: 2000 mL

## 2019-09-25 MED ORDER — SODIUM CHLORIDE (PF) 0.9 % IJ SOLN
INTRAMUSCULAR | Status: DC | PRN
  Administered 2019-09-25: 60 mL

## 2019-09-25 MED ORDER — INSULIN ASPART 100 UNIT/ML ~~LOC~~ SOLN
0.0000 [IU] | Freq: Three times a day (TID) | SUBCUTANEOUS | Status: DC
  Administered 2019-09-25: 5 [IU] via SUBCUTANEOUS
  Administered 2019-09-26: 8 [IU] via SUBCUTANEOUS
  Administered 2019-09-26: 2 [IU] via SUBCUTANEOUS

## 2019-09-25 MED ORDER — METOCLOPRAMIDE HCL 5 MG PO TABS: 5.0000 mg | ORAL_TABLET | Freq: Three times a day (TID) | ORAL | Status: DC | PRN

## 2019-09-25 MED ORDER — ROPIVACAINE HCL 5 MG/ML IJ SOLN
INTRAMUSCULAR | Status: DC | PRN
  Administered 2019-09-25: 30 mL via PERINEURAL

## 2019-09-25 MED ORDER — FLEET ENEMA 7-19 GM/118ML RE ENEM: 1.0000 | ENEMA | Freq: Once | RECTAL | Status: DC | PRN

## 2019-09-25 MED ORDER — DEXAMETHASONE SODIUM PHOSPHATE 10 MG/ML IJ SOLN
10.0000 mg | Freq: Once | INTRAMUSCULAR | Status: AC
  Administered 2019-09-26: 10 mg via INTRAVENOUS
  Filled 2019-09-25: qty 1

## 2019-09-25 MED ORDER — ONDANSETRON HCL 4 MG PO TABS: 4.0000 mg | ORAL_TABLET | Freq: Four times a day (QID) | ORAL | Status: DC | PRN

## 2019-09-25 MED ORDER — GABAPENTIN 300 MG PO CAPS
300.0000 mg | ORAL_CAPSULE | Freq: Three times a day (TID) | ORAL | Status: DC
  Administered 2019-09-25 – 2019-09-26 (×3): 300 mg via ORAL
  Filled 2019-09-25 (×3): qty 1

## 2019-09-25 MED ORDER — AMITRIPTYLINE HCL 50 MG PO TABS
75.0000 mg | ORAL_TABLET | Freq: Every day | ORAL | Status: DC
  Administered 2019-09-25: 75 mg via ORAL
  Filled 2019-09-25: qty 1

## 2019-09-25 MED ORDER — OXYCODONE HCL 5 MG/5ML PO SOLN: 5.0000 mg | Freq: Once | ORAL | Status: DC | PRN

## 2019-09-25 MED ORDER — OXYCODONE HCL 5 MG PO TABS: 5.0000 mg | ORAL_TABLET | Freq: Once | ORAL | Status: DC | PRN

## 2019-09-25 MED ORDER — ASPIRIN EC 325 MG PO TBEC
325.0000 mg | DELAYED_RELEASE_TABLET | Freq: Two times a day (BID) | ORAL | Status: DC
  Administered 2019-09-26: 325 mg via ORAL
  Filled 2019-09-25: qty 1

## 2019-09-25 MED ORDER — DEXAMETHASONE SODIUM PHOSPHATE 10 MG/ML IJ SOLN
8.0000 mg | Freq: Once | INTRAMUSCULAR | Status: AC
  Administered 2019-09-25: 8 mg via INTRAVENOUS

## 2019-09-25 MED ORDER — FENTANYL CITRATE (PF) 100 MCG/2ML IJ SOLN: 25.0000 ug | INTRAMUSCULAR | Status: DC | PRN

## 2019-09-25 SURGICAL SUPPLY — 51 items
BAG ZIPLOCK 12X15 (MISCELLANEOUS) ×2 IMPLANT
BLADE SAG 18X100X1.27 (BLADE) ×2 IMPLANT
BLADE SAW SGTL 11.0X1.19X90.0M (BLADE) ×2 IMPLANT
BLADE SURG SZ10 CARB STEEL (BLADE) ×4 IMPLANT
BNDG ELASTIC 6X5.8 VLCR STR LF (GAUZE/BANDAGES/DRESSINGS) ×2 IMPLANT
BOWL SMART MIX CTS (DISPOSABLE) ×2 IMPLANT
CEMENT HV SMART SET (Cement) ×4 IMPLANT
CEMENT TIBIA MBT (Knees) ×1 IMPLANT
COVER SURGICAL LIGHT HANDLE (MISCELLANEOUS) ×2 IMPLANT
COVER WAND RF STERILE (DRAPES) IMPLANT
CUFF TOURN SGL QUICK 34 (TOURNIQUET CUFF) ×2
CUFF TRNQT CYL 34X4.125X (TOURNIQUET CUFF) ×1 IMPLANT
DECANTER SPIKE VIAL GLASS SM (MISCELLANEOUS) ×2 IMPLANT
DRAPE U-SHAPE 47X51 STRL (DRAPES) ×2 IMPLANT
DRSG AQUACEL AG ADV 3.5X10 (GAUZE/BANDAGES/DRESSINGS) ×2 IMPLANT
DURAPREP 26ML APPLICATOR (WOUND CARE) ×2 IMPLANT
ELECT REM PT RETURN 15FT ADLT (MISCELLANEOUS) ×2 IMPLANT
FEMUR SIGMA PS KNEE SZ 4.0N L (Femur) ×2 IMPLANT
GLOVE BIO SURGEON STRL SZ7 (GLOVE) ×2 IMPLANT
GLOVE BIO SURGEON STRL SZ8 (GLOVE) ×2 IMPLANT
GLOVE BIOGEL PI IND STRL 7.0 (GLOVE) ×1 IMPLANT
GLOVE BIOGEL PI IND STRL 8 (GLOVE) ×1 IMPLANT
GLOVE BIOGEL PI INDICATOR 7.0 (GLOVE) ×1
GLOVE BIOGEL PI INDICATOR 8 (GLOVE) ×1
GOWN STRL REUS W/TWL LRG LVL3 (GOWN DISPOSABLE) ×4 IMPLANT
HANDPIECE INTERPULSE COAX TIP (DISPOSABLE) ×2
HOLDER FOLEY CATH W/STRAP (MISCELLANEOUS) IMPLANT
IMMOBILIZER KNEE 20 (SOFTGOODS) ×2
IMMOBILIZER KNEE 20 THIGH 36 (SOFTGOODS) ×1 IMPLANT
KIT TURNOVER KIT A (KITS) IMPLANT
MANIFOLD NEPTUNE II (INSTRUMENTS) ×2 IMPLANT
NS IRRIG 1000ML POUR BTL (IV SOLUTION) ×2 IMPLANT
PACK TOTAL KNEE CUSTOM (KITS) ×2 IMPLANT
PADDING CAST COTTON 6X4 STRL (CAST SUPPLIES) ×2 IMPLANT
PATELLA DOME PFC 35MM (Knees) ×2 IMPLANT
PENCIL SMOKE EVACUATOR (MISCELLANEOUS) IMPLANT
PIN STEINMAN FIXATION KNEE (PIN) ×2 IMPLANT
PLATE ROT INSERT 10MM SIZE 4 (Plate) ×2 IMPLANT
PROTECTOR NERVE ULNAR (MISCELLANEOUS) ×2 IMPLANT
SET HNDPC FAN SPRY TIP SCT (DISPOSABLE) ×1 IMPLANT
STRIP CLOSURE SKIN 1/2X4 (GAUZE/BANDAGES/DRESSINGS) ×2 IMPLANT
SUT MNCRL AB 4-0 PS2 18 (SUTURE) ×2 IMPLANT
SUT STRATAFIX 0 PDS 27 VIOLET (SUTURE) ×2
SUT VIC AB 2-0 CT1 27 (SUTURE) ×6
SUT VIC AB 2-0 CT1 TAPERPNT 27 (SUTURE) ×3 IMPLANT
SUTURE STRATFX 0 PDS 27 VIOLET (SUTURE) ×1 IMPLANT
TIBIA MBT CEMENT (Knees) ×2 IMPLANT
TRAY FOLEY MTR SLVR 16FR STAT (SET/KITS/TRAYS/PACK) ×2 IMPLANT
WATER STERILE IRR 1000ML POUR (IV SOLUTION) ×4 IMPLANT
WRAP KNEE MAXI GEL POST OP (GAUZE/BANDAGES/DRESSINGS) ×2 IMPLANT
YANKAUER SUCT BULB TIP 10FT TU (MISCELLANEOUS) ×2 IMPLANT

## 2019-09-25 NOTE — Evaluation (Signed)
Physical Therapy Evaluation Patient Details Name: Ashley Perkins MRN: [id number] DOB: 1940-05-13 Today's Date: 09/25/2019   History of Present Illness  Patient is 79 y.o. female s/p Lt TKA on 09/25/19 with PMH significant for fibromyaliga, OA, DM, hypothyroidism, back surgery.    Clinical Impression  Ashley Perkins is a 79 y.o. female POD 0 s/p Lt TKA. Patient reports independence with mobility at baseline. Patient is now limited by functional impairments (see PT problem list below) and requires min assist for transfers and gait with RW. Patient was able to ambulate ~70 feet with RW and min assist. Patient instructed in exercise to facilitate ROM and circulation. Patient will benefit from continued skilled PT interventions to address impairments and progress towards PLOF. Acute PT will follow to progress mobility and stair training in preparation for safe discharge home.     Follow Up Recommendations Follow surgeon's recommendation for DC plan and follow-up therapies;Outpatient PT    Equipment Recommendations  Rolling walker with 5" wheels    Recommendations for Other Services       Precautions / Restrictions Precautions Precautions: Fall Restrictions Weight Bearing Restrictions: No Other Position/Activity Restrictions: WBAT      Mobility  Bed Mobility Overal bed mobility: Needs Assistance Bed Mobility: Supine to Sit     Supine to sit: Min assist     General bed mobility comments: cues to use be rail to raise trunk and assist to bring Lt LE off EOB.  Transfers Overall transfer level: Needs assistance Equipment used: Rolling walker (2 wheeled) Transfers: Sit to/from Stand Sit to Stand: Min assist         Ambulation/Gait Ambulation/Gait assistance: Min Web designer (Feet): 70 Feet Assistive device: Rolling walker (2 wheeled) Gait Pattern/deviations: Step-to pattern;Decreased stride length;Decreased stance time - left;Decreased weight shift to left Gait  velocity: decr   General Gait Details: cues for safe step pattern and to maintain safe position to RW. assist to walker position.   Stairs         Wheelchair Mobility    Modified Rankin (Stroke Patients Only)       Balance Overall balance assessment: Needs assistance Sitting-balance support: Feet supported Sitting balance-Leahy Scale: Good     Standing balance support: During functional activity;Bilateral upper extremity supported Standing balance-Leahy Scale: Fair              Pertinent Vitals/Pain Pain Assessment: Faces Faces Pain Scale: Hurts little more Pain Location: Lt knee Pain Descriptors / Indicators: Aching;Discomfort;Sore Pain Intervention(s): Limited activity within patient's tolerance;Monitored during session;Repositioned    Home Living Family/patient expects to be discharged to:: Private residence Living Arrangements: Spouse/significant other Available Help at Discharge: Family Type of Home: House (condo) Home Access: Stairs to enter Entrance Stairs-Rails: None Entrance Stairs-Number of Steps: 3 Home Layout: One level Home Equipment: Cane - single point;Tub bench;Toilet riser      Prior Function Level of Independence: Independent with assistive device(s)   Gait / Transfers Assistance Needed: pt using SPC for mobility           Hand Dominance   Dominant Hand: Right    Extremity/Trunk Assessment   Upper Extremity Assessment Upper Extremity Assessment: Overall WFL for tasks assessed    Lower Extremity Assessment Lower Extremity Assessment: LLE deficits/detail LLE Deficits / Details: good quad activation, no extensor lag with SLR. LLE Sensation: WNL LLE Coordination: WNL    Cervical / Trunk Assessment Cervical / Trunk Assessment: Normal  Communication   Communication: No difficulties  Cognition Arousal/Alertness:  Awake/alert Behavior During Therapy: WFL for tasks assessed/performed Overall Cognitive Status: Within Functional  Limits for tasks assessed               General Comments      Exercises Total Joint Exercises Ankle Circles/Pumps: AROM;Both;20 reps;Seated Quad Sets: AROM;Left;5 reps;Seated Heel Slides: AROM;Left;5 reps;Seated   Assessment/Plan    PT Assessment Patient needs continued PT services  PT Problem List Decreased strength;Decreased range of motion;Decreased activity tolerance;Decreased balance;Decreased mobility;Decreased knowledge of use of DME;Pain       PT Treatment Interventions DME instruction;Gait training;Stair training;Functional mobility training;Therapeutic exercise;Therapeutic activities;Balance training;Patient/family education    PT Goals (Current goals can be found in the Care Plan section)  Acute Rehab PT Goals Patient Stated Goal: get back to water aerobics PT Goal Formulation: With patient Time For Goal Achievement: 10/02/19 Potential to Achieve Goals: Good    Frequency 7X/week    AM-PAC PT "6 Clicks" Mobility  Outcome Measure Help needed turning from your back to your side while in a flat bed without using bedrails?: A Little Help needed moving from lying on your back to sitting on the side of a flat bed without using bedrails?: A Little Help needed moving to and from a bed to a chair (including a wheelchair)?: A Little Help needed standing up from a chair using your arms (e.g., wheelchair or bedside chair)?: A Little Help needed to walk in hospital room?: A Little Help needed climbing 3-5 steps with a railing? : A Little 6 Click Score: 18    End of Session Equipment Utilized During Treatment: Gait belt Activity Tolerance: Patient tolerated treatment well Patient left: in chair;with call bell/phone within reach;with chair alarm set;with family/visitor present Nurse Communication: Mobility status PT Visit Diagnosis: Muscle weakness (generalized) (M62.81);Difficulty in walking, not elsewhere classified (R26.2)    Time: 2706-2376 PT Time Calculation  (min) (ACUTE ONLY): 26 min   Charges:   PT Evaluation $PT Eval Low Complexity: 1 Low PT Treatments $Gait Training: 8-22 mins        Verner Mould, DPT Acute Rehabilitation Services  Office [phone] Pager [phone]  09/25/2019 6:20 PM

## 2019-09-25 NOTE — Anesthesia Procedure Notes (Signed)
Spinal  Patient location during procedure: OR Start time: 09/25/2019 11:21 AM Staffing Performed: resident/CRNA  Anesthesiologist: Pervis Hocking, DO Resident/CRNA: Eben Burow, CRNA Preanesthetic Checklist Completed: patient identified, IV checked, site marked, risks and benefits discussed, surgical consent, monitors and equipment checked, pre-op evaluation and timeout performed Spinal Block Patient position: sitting Prep: DuraPrep and site prepped and draped Patient monitoring: heart rate, cardiac monitor, continuous pulse ox and blood pressure Approach: midline Location: L3-4 Injection technique: single-shot Needle Needle type: Pencan  Needle gauge: 24 G Needle length: 9 cm Assessment Sensory level: T4 Additional Notes Pt placed in sitting position, spinal kit expiration date checked and verified, + CSF, - heme, pt tolerated well. Dr Doroteo Glassman present and supervising throughout [place].

## 2019-09-25 NOTE — Anesthesia Procedure Notes (Signed)
Procedure Name: MAC Date/Time: 09/25/2019 11:17 AM Performed by: Eben Burow, CRNA Pre-anesthesia Checklist: Patient identified, Emergency Drugs available, Suction available, Patient being monitored and Timeout performed Oxygen Delivery Method: Simple face mask Placement Confirmation: positive ETCO2

## 2019-09-25 NOTE — Anesthesia Postprocedure Evaluation (Signed)
Anesthesia Post Note  Patient: Ashley Perkins  Procedure(s) Performed: TOTAL KNEE ARTHROPLASTY (Left Knee)     Patient location during evaluation: PACU Anesthesia Type: Regional, MAC and Spinal Level of consciousness: oriented and awake and alert Pain management: pain level controlled Vital Signs Assessment: post-procedure vital signs reviewed and stable Respiratory status: spontaneous breathing, respiratory function stable and patient connected to nasal cannula oxygen Cardiovascular status: blood pressure returned to baseline and stable Postop Assessment: no headache, no backache, no apparent nausea or vomiting, patient able to bend at knees and spinal receding Anesthetic complications: no   No complications documented.  Last Vitals:  Vitals:   09/25/19 1330 09/25/19 1345  BP: 140/79 138/74  Pulse: 78 64  Resp: 16 12  Temp:    SpO2: 100% 100%    Last Pain:  Vitals:   09/25/19 1330  TempSrc:   PainSc: 0-No pain                 Pervis Hocking

## 2019-09-25 NOTE — Progress Notes (Signed)
Assisted Dr. Beth Finucane with left, ultrasound guided, adductor canal block. Side rails up, monitors on throughout procedure. See vital signs in flow sheet. Tolerated Procedure well. ° °

## 2019-09-25 NOTE — Op Note (Signed)
OPERATIVE REPORT-TOTAL KNEE ARTHROPLASTY   Pre-operative diagnosis- Osteoarthritis  Left knee(s)  Post-operative diagnosis- Osteoarthritis Left knee(s)  Procedure-  Left  Total Knee Arthroplasty  Surgeon- Dione Plover. Latashia Koch, MD  Assistant- Theresa Duty, PA-C   Anesthesia-  Adductor canal block and spinal  EBL-50 mL   Drains Hemovac  Tourniquet time-  Total Tourniquet Time Documented: Thigh (Left) - 38 minutes Total: Thigh (Left) - 38 minutes     Complications- None  Condition-PACU - hemodynamically stable.   Brief Clinical Note  Ashley Perkins is a 79 y.o. year old female with end stage OA of her left knee with progressively worsening pain and dysfunction. She has constant pain, with activity and at rest and significant functional deficits with difficulties even with ADLs. She has had extensive non-op management including analgesics, injections of cortisone and viscosupplements, and home exercise program, but remains in significant pain with significant dysfunction. Radiographs show bone on bone arthritis lateral and patellofemoral. She presents now for left Total Knee Arthroplasty.    Procedure in detail---   The patient is brought into the operating room and positioned supine on the operating table. After successful administration of  Adductor canal block and spinal,   a tourniquet is placed high on the  Left thigh(s) and the lower extremity is prepped and draped in the usual sterile fashion. Time out is performed by the operating team and then the  Left lower extremity is wrapped in Esmarch, knee flexed and the tourniquet inflated to 300 mmHg.       A midline incision is made with a ten blade through the subcutaneous tissue to the level of the extensor mechanism. A fresh blade is used to make a medial parapatellar arthrotomy. Soft tissue over the proximal medial tibia is subperiosteally elevated to the joint line with a knife and into the semimembranosus bursa with a Cobb  elevator. Soft tissue over the proximal lateral tibia is elevated with attention being paid to avoiding the patellar tendon on the tibial tubercle. The patella is everted, knee flexed 90 degrees and the ACL and PCL are removed. Findings are bone on bone lateral and patellofemoral with large global osteophytes.        The drill is used to create a starting hole in the distal femur and the canal is thoroughly irrigated with sterile saline to remove the fatty contents. The 5 degree Left  valgus alignment guide is placed into the femoral canal and the distal femoral cutting block is pinned to remove 10 mm off the distal femur. Resection is made with an oscillating saw.      The tibia is subluxed forward and the menisci are removed. The extramedullary alignment guide is placed referencing proximally at the medial aspect of the tibial tubercle and distally along the second metatarsal axis and tibial crest. The block is pinned to remove 51mm off the more deficient lateral  side. Resection is made with an oscillating saw. Size 3is the most appropriate size for the tibia and the proximal tibia is prepared with the modular drill and keel punch for that size.      The femoral sizing guide is placed and size 4 is most appropriate. Rotation is marked off the epicondylar axis and confirmed by creating a rectangular flexion gap at 90 degrees. The size 4 cutting block is pinned in this rotation and the anterior, posterior and chamfer cuts are made with the oscillating saw. The intercondylar block is then placed and that cut is made.  Trial size 3 tibial component, trial size 4 narrow posterior stabilized femur and a 10  mm posterior stabilized rotating platform insert trial is placed. Full extension is achieved with excellent varus/valgus and anterior/posterior balance throughout full range of motion. The patella is everted and thickness measured to be 22  mm. Free hand resection is taken to 12 mm, a 35 template is placed,  lug holes are drilled, trial patella is placed, and it tracks normally. Osteophytes are removed off the posterior femur with the trial in place. All trials are removed and the cut bone surfaces prepared with pulsatile lavage. Cement is mixed and once ready for implantation, the size 3 tibial implant, size  4 narrow posterior stabilized femoral component, and the size 35 patella are cemented in place and the patella is held with the clamp. The trial insert is placed and the knee held in full extension. The Exparel (20 ml mixed with 60 ml saline) is injected into the extensor mechanism, posterior capsule, medial and lateral gutters and subcutaneous tissues.  All extruded cement is removed and once the cement is hard the permanent 10 mm posterior stabilized rotating platform insert is placed into the tibial tray.      The wound is copiously irrigated with saline solution and the extensor mechanism closed  with #1 V-loc suture. The tourniquet is released for a total tourniquet time of 38  minutes. Flexion against gravity is 140 degrees and the patella tracks normally. Subcutaneous tissue is closed with 2.0 vicryl and subcuticular with running 4.0 Monocryl. The incision is cleaned and dried and steri-strips and a bulky sterile dressing are applied. The limb is placed into a knee immobilizer and the patient is awakened and transported to recovery in stable condition.      Please note that a surgical assistant was a medical necessity for this procedure in order to perform it in a safe and expeditious manner. Surgical assistant was necessary to retract the ligaments and vital neurovascular structures to prevent injury to them and also necessary for proper positioning of the limb to allow for anatomic placement of the prosthesis.   Dione Plover Ashley Acri, MD    09/25/2019, 12:31 PM

## 2019-09-25 NOTE — Interval H&P Note (Signed)
History and Physical Interval Note:  09/25/2019 9:15 AM  Ashley Perkins  has presented today for surgery, with the diagnosis of left knee osteoarthritis.  The various methods of treatment have been discussed with the patient and family. After consideration of risks, benefits and other options for treatment, the patient has consented to  Procedure(s): TOTAL KNEE ARTHROPLASTY (Left) as a surgical intervention.  The patient's history has been reviewed, patient examined, no change in status, stable for surgery.  I have reviewed the patient's chart and labs.  Questions were answered to the patient's satisfaction.     Pilar Plate Norell Brisbin

## 2019-09-25 NOTE — Care Plan (Signed)
Ortho Bundle Case Management Note  Patient Details  Name: Ashley Perkins MRN: [id number] Date of Birth: 1940/12/29                  L TKA DCP: Home with husband. 1 story condo with 2-3 ste. DME: RW and 3-in-1 ordered through [facility] PT: EO 09-28-19   DME Arranged:  3-N-1, Walker rolling DME Agency:  [employer]  HH Arranged:    [place] Agency:     Additional Comments: Please contact me with any questions of if this plan should need to change.  Marianne Sofia, RN,CCM [facility]  [phone] 09/25/2019, 11:18 AM

## 2019-09-25 NOTE — Transfer of Care (Signed)
Immediate Anesthesia Transfer of Care Note  Patient: Ashley Perkins  Procedure(s) Performed: TOTAL KNEE ARTHROPLASTY (Left Knee)  Patient Location: PACU  Anesthesia Type:Spinal  Level of Consciousness: awake, alert  and patient cooperative  Airway & Oxygen Therapy: Patient Spontanous Breathing and Patient connected to face mask oxygen  Post-op Assessment: Report given to RN and Post -op Vital signs reviewed and stable  Post vital signs: Reviewed and stable  Last Vitals:  Vitals Value Taken Time  BP 127/68 09/25/19 1301  Temp    Pulse 72 09/25/19 1302  Resp 13 09/25/19 1302  SpO2 100 % 09/25/19 1302  Vitals shown include unvalidated device data.  Last Pain:  Vitals:   09/25/19 0942  TempSrc: Oral      Patients Stated Pain Goal: 4 (07/61/51 8343)  Complications: No complications documented.

## 2019-09-25 NOTE — Discharge Instructions (Addendum)
 Ashley Aluisio, MD Total Joint Specialist [facility] [place] [address] [place], [place] [address] [phone]  TOTAL KNEE REPLACEMENT POSTOPERATIVE DIRECTIONS    Knee Rehabilitation, Guidelines Following Surgery  Results after knee surgery are often greatly improved when you follow the exercise, range of motion and muscle strengthening exercises prescribed by your doctor. Safety measures are also important to protect the knee from further injury. If any of these exercises cause you to have increased pain or swelling in your knee joint, decrease the amount until you are comfortable again and slowly increase them. If you have problems or questions, call your caregiver or physical therapist for advice.   BLOOD CLOT PREVENTION . Take a 325 mg Aspirin two times a day for three weeks following surgery. Then take an 81 mg Aspirin once a day for three weeks. Then discontinue Aspirin. . You may resume your vitamins/supplements upon discharge from the hospital. . Do not take any NSAIDs (Advil, Aleve, Ibuprofen, Meloxicam, etc.) until you have discontinued the 325 mg Aspirin.  HOME CARE INSTRUCTIONS  . Remove items at home which could result in a fall. This includes throw rugs or furniture in walking pathways.  . ICE to the affected knee as much as tolerated. Icing helps control swelling. If the swelling is well controlled you will be more comfortable and rehab easier. Continue to use ice on the knee for pain and swelling from surgery. You may notice swelling that will progress down to the foot and ankle. This is normal after surgery. Elevate the leg when you are not up walking on it.    . Continue to use the breathing machine which will help keep your temperature down. It is common for your temperature to cycle up and down following surgery, especially at night when you are not up moving around and exerting yourself. The breathing machine keeps your lungs expanded and your  temperature down. . Do not place pillow under the operative knee, focus on keeping the knee straight while resting  DIET You may resume your previous home diet once you are discharged from the hospital.  DRESSING / WOUND CARE / SHOWERING . Keep your bulky bandage on for 2 days. On the third post-operative day you may remove the Ace bandage and gauze. There is a waterproof adhesive bandage on your skin which will stay in place until your first follow-up appointment. Once you remove this you will not need to place another bandage . You may begin showering 3 days following surgery, but do not submerge the incision under water.  ACTIVITY For the first 5 days, the key is rest and control of pain and swelling . Do your home exercises twice a day starting on post-operative day 3. On the days you go to physical therapy, just do the home exercises once that day. . You should rest, ice and elevate the leg for 50 minutes out of every hour. Get up and walk/stretch for 10 minutes per hour. After 5 days you can increase your activity slowly as tolerated. . Walk with your walker as instructed. Use the walker until you are comfortable transitioning to a cane. Walk with the cane in the opposite hand of the operative leg. You may discontinue the cane once you are comfortable and walking steadily. . Avoid periods of inactivity such as sitting longer than an hour when not asleep. This helps prevent blood clots.  . You may discontinue the knee immobilizer once you are able to perform a straight   leg raise while lying down. . You may resume a sexual relationship in one month or when given the OK by your doctor.  . You may return to work once you are cleared by your doctor.  . Do not drive a car for 6 weeks or until released by your surgeon.  . Do not drive while taking narcotics.  TED HOSE STOCKINGS Wear the elastic stockings on both legs for three weeks following surgery during the day. You may remove them at night  for sleeping.  WEIGHT BEARING Weight bearing as tolerated with assist device (walker, cane, etc) as directed, use it as long as suggested by your surgeon or therapist, typically at least 4-6 weeks.  POSTOPERATIVE CONSTIPATION PROTOCOL Constipation - defined medically as fewer than three stools per week and severe constipation as less than one stool per week.  One of the most common issues patients have following surgery is constipation.  Even if you have a regular bowel pattern at home, your normal regimen is likely to be disrupted due to multiple reasons following surgery.  Combination of anesthesia, postoperative narcotics, change in appetite and fluid intake all can affect your bowels.  In order to avoid complications following surgery, here are some recommendations in order to help you during your recovery period.  . Colace (docusate) - Pick up an over-the-counter form of Colace or another stool softener and take twice a day as long as you are requiring postoperative pain medications.  Take with a full glass of water daily.  If you experience loose stools or diarrhea, hold the colace until you stool forms back up. If your symptoms do not get better within 1 week or if they get worse, check with your doctor. . Dulcolax (bisacodyl) - Pick up over-the-counter and take as directed by the product packaging as needed to assist with the movement of your bowels.  Take with a full glass of water.  Use this product as needed if not relieved by Colace only.  . MiraLax (polyethylene glycol) - Pick up over-the-counter to have on hand. MiraLax is a solution that will increase the amount of water in your bowels to assist with bowel movements.  Take as directed and can mix with a glass of water, juice, soda, coffee, or tea. Take if you go more than two days without a movement. Do not use MiraLax more than once per day. Call your doctor if you are still constipated or irregular after using this medication for 7 days  in a row.  If you continue to have problems with postoperative constipation, please contact the office for further assistance and recommendations.  If you experience "the worst abdominal pain ever" or develop nausea or vomiting, please contact the office immediatly for further recommendations for treatment.  ITCHING If you experience itching with your medications, try taking only a single pain pill, or even half a pain pill at a time.  You can also use Benadryl over the counter for itching or also to help with sleep.   MEDICATIONS See your medication summary on the "After Visit Summary" that the nursing staff will review with you prior to discharge.  You may have some home medications which will be placed on hold until you complete the course of blood thinner medication.  It is important for you to complete the blood thinner medication as prescribed by your surgeon.  Continue your approved medications as instructed at time of discharge.  PRECAUTIONS . If you experience chest pain or shortness of   breath - call 911 immediately for transfer to the hospital emergency department.  . If you develop a fever greater that 101 F, purulent drainage from wound, increased redness or drainage from wound, foul odor from the wound/dressing, or calf pain - CONTACT YOUR SURGEON.                                                   FOLLOW-UP APPOINTMENTS Make sure you keep all of your appointments after your operation with your surgeon and caregivers. You should call the office at the above phone number and make an appointment for approximately two weeks after the date of your surgery or on the date instructed by your surgeon outlined in the "After Visit Summary".  RANGE OF MOTION AND STRENGTHENING EXERCISES  Rehabilitation of the knee is important following a knee injury or an operation. After just a few days of immobilization, the muscles of the thigh which control the knee become weakened and shrink (atrophy). Knee  exercises are designed to build up the tone and strength of the thigh muscles and to improve knee motion. Often times heat used for twenty to thirty minutes before working out will loosen up your tissues and help with improving the range of motion but do not use heat for the first two weeks following surgery. These exercises can be done on a training (exercise) mat, on the floor, on a table or on a bed. Use what ever works the best and is most comfortable for you Knee exercises include:  . Leg Lifts - While your knee is still immobilized in a splint or cast, you can do straight leg raises. Lift the leg to 60 degrees, hold for 3 sec, and slowly lower the leg. Repeat 10-20 times 2-3 times daily. Perform this exercise against resistance later as your knee gets better.  . Quad and Hamstring Sets - Tighten up the muscle on the front of the thigh (Quad) and hold for 5-10 sec. Repeat this 10-20 times hourly. Hamstring sets are done by pushing the foot backward against an object and holding for 5-10 sec. Repeat as with quad sets.   Leg Slides: Lying on your back, slowly slide your foot toward your buttocks, bending your knee up off the floor (only go as far as is comfortable). Then slowly slide your foot back down until your leg is flat on the floor again.  Angel Wings: Lying on your back spread your legs to the side as far apart as you can without causing discomfort.  A rehabilitation program following serious knee injuries can speed recovery and prevent re-injury in the future due to weakened muscles. Contact your doctor or a physical therapist for more information on knee rehabilitation.   IF YOU ARE TRANSFERRED TO A SKILLED REHAB FACILITY If the patient is transferred to a skilled rehab facility following release from the hospital, a list of the current medications will be sent to the facility for the patient to continue.  When discharged from the skilled rehab facility, please have the facility set up the  patient's Home Health Physical Therapy prior to being released. Also, the skilled facility will be responsible for providing the patient with their medications at time of release from the facility to include their pain medication, the muscle relaxants, and their blood thinner medication. If the patient is still at the   rehab facility at time of the two week follow up appointment, the skilled rehab facility will also need to assist the patient in arranging follow up appointment in our office and any transportation needs.  MAKE SURE YOU:  . Understand these instructions.  . Get help right away if you are not doing well or get worse.   DENTAL ANTIBIOTICS:  In most cases prophylactic antibiotics for Dental procdeures after total joint surgery are not necessary.  Exceptions are as follows:  1. History of prior total joint infection  2. Severely immunocompromised (Organ Transplant, cancer chemotherapy, Rheumatoid biologic meds such as Humera)  3. Poorly controlled diabetes (A1C &gt; 8.0, blood glucose over 200)  If you have one of these conditions, contact your surgeon for an antibiotic prescription, prior to your dental procedure.    Pick up stool softner and laxative for home use following surgery while on pain medications. Do not submerge incision under water. Please use good hand washing techniques while changing dressing each day. May shower starting three days after surgery. Please use a clean towel to pat the incision dry following showers. Continue to use ice for pain and swelling after surgery. Do not use any lotions or creams on the incision until instructed by your surgeon.  

## 2019-09-25 NOTE — Anesthesia Procedure Notes (Signed)
Anesthesia Regional Block: Adductor canal block   Pre-Anesthetic Checklist: ,, timeout performed, Correct Patient, Correct Site, Correct Laterality, Correct Procedure, Correct Position, site marked, Risks and benefits discussed,  Surgical consent,  Pre-op evaluation,  At surgeon's request and post-op pain management  Laterality: Left  Prep: Maximum Sterile Barrier Precautions used, chloraprep       Needles:  Injection technique: Single-shot  Needle Type: Echogenic Stimulator Needle     Needle Length: 9cm  Needle Gauge: 22     Additional Needles:   Procedures:,,,, ultrasound used (permanent image in chart),,,,  Narrative:  Start time: 09/25/2019 10:30 AM End time: 09/25/2019 10:38 AM Injection made incrementally with aspirations every 5 mL.  Performed by: Personally  Anesthesiologist: Pervis Hocking, DO  Additional Notes: Monitors applied. No increased pain on injection. No increased resistance to injection. Injection made in 5cc increments. Good needle visualization. Patient tolerated procedure well.

## 2019-09-26 ENCOUNTER — Encounter (HOSPITAL_COMMUNITY): Payer: Self-pay | Admitting: Orthopedic Surgery

## 2019-09-26 DIAGNOSIS — M1712 Unilateral primary osteoarthritis, left knee: Secondary | ICD-10-CM | POA: Diagnosis not present

## 2019-09-26 LAB — CBC
HCT: 33.1 % — ABNORMAL LOW (ref 36.0–46.0)
Hemoglobin: 11.1 g/dL — ABNORMAL LOW (ref 12.0–15.0)
MCH: 30.5 pg (ref 26.0–34.0)
MCHC: 33.5 g/dL (ref 30.0–36.0)
MCV: 90.9 fL (ref 80.0–100.0)
Platelets: 225 10*3/uL (ref 150–400)
RBC: 3.64 MIL/uL — ABNORMAL LOW (ref 3.87–5.11)
RDW: 13.2 % (ref 11.5–15.5)
WBC: 11.3 10*3/uL — ABNORMAL HIGH (ref 4.0–10.5)
nRBC: 0 % (ref 0.0–0.2)

## 2019-09-26 LAB — BASIC METABOLIC PANEL
Anion gap: 8 (ref 5–15)
BUN: 10 mg/dL (ref 8–23)
CO2: 25 mmol/L (ref 22–32)
Calcium: 8.2 mg/dL — ABNORMAL LOW (ref 8.9–10.3)
Chloride: 100 mmol/L (ref 98–111)
Creatinine, Ser: 0.7 mg/dL (ref 0.44–1.00)
GFR calc Af Amer: >60 mL/min (ref >60)
GFR calc non Af Amer: >60 mL/min (ref >60)
Glucose, Bld: 188 mg/dL — ABNORMAL HIGH (ref 70–99)
Potassium: 3.6 mmol/L (ref 3.5–5.1)
Sodium: 133 mmol/L — ABNORMAL LOW (ref 135–145)

## 2019-09-26 LAB — GLUCOSE, CAPILLARY
Glucose-Capillary: 139 mg/dL — ABNORMAL HIGH (ref 70–99)
Glucose-Capillary: 273 mg/dL — ABNORMAL HIGH (ref 70–99)

## 2019-09-26 MED ORDER — TRAMADOL HCL 50 MG PO TABS: 50.0000 mg | ORAL_TABLET | Freq: Four times a day (QID) | ORAL | 0 refills | Status: DC | PRN

## 2019-09-26 MED ORDER — METHOCARBAMOL 500 MG PO TABS: 500.0000 mg | ORAL_TABLET | Freq: Four times a day (QID) | ORAL | 0 refills | Status: DC | PRN

## 2019-09-26 MED ORDER — ASPIRIN 325 MG PO TBEC: 325.0000 mg | DELAYED_RELEASE_TABLET | Freq: Two times a day (BID) | ORAL | 0 refills | Status: AC

## 2019-09-26 MED ORDER — OXYCODONE HCL 5 MG PO TABS: 5.0000 mg | ORAL_TABLET | Freq: Four times a day (QID) | ORAL | 0 refills | Status: DC | PRN

## 2019-09-26 NOTE — Plan of Care (Signed)
Plan of care reviewed and discussed with the patient. 

## 2019-09-26 NOTE — Progress Notes (Signed)
   Subjective: 1 Day Post-Op Procedure(s) (LRB): TOTAL KNEE ARTHROPLASTY (Left) Patient reports pain as mild.   Patient seen in rounds by Dr. Wynelle Link. Patient is well, and has had no acute complaints or problems. States she is ready to go home. Denies chest pain, SOB, or calf pain. No issues overnight. [place] catheter to be removed this AM. We will continue therapy today, ambulated 34' yesterday.   Objective: Vital signs in last 24 hours: Temp:  [97.5 F (36.4 C)-98.4 F (36.9 C)] 97.5 F (36.4 C) (07/13 0615) Pulse Rate:  [56-107] 61 (07/13 0615) Resp:  [9-18] 14 (07/13 0615) BP: (115-162)/(65-106) 115/66 (07/13 0615) SpO2:  [96 %-100 %] 99 % (07/13 0615) Weight:  [83.5 kg] 83.5 kg (07/12 0948)  Intake/Output from previous day:  Intake/Output Summary (Last 24 hours) at 09/26/2019 0728 Last data filed at 09/26/2019 0625 Gross per 24 hour  Intake 3108.81 ml  Output 4250 ml  Net -1141.19 ml     Intake/Output this shift: No intake/output data recorded.  Labs: Recent Labs    09/26/19 0304  HGB 11.1*   Recent Labs    09/26/19 0304  WBC 11.3*  RBC 3.64*  HCT 33.1*  PLT 225   Recent Labs    09/25/19 1321 09/26/19 0304  NA 137 133*  K 3.9 3.6  CL 101 100  CO2 29 25  BUN 9 10  CREATININE 0.70 0.70  GLUCOSE 160* 188*  CALCIUM 8.5* 8.2*   No results for input(s): LABPT, INR in the last 72 hours.  Exam: General - Patient is Alert and Oriented Extremity - Neurologically intact Neurovascular intact Sensation intact distally Dorsiflexion/Plantar flexion intact Dressing - dressing C/D/I Motor Function - intact, moving foot and toes well on exam.   Past Medical History:  Diagnosis Date  . Arthritis    knees,  . Complication of anesthesia   . Diabetes mellitus without complication ([place])   . Fibromyalgia   . Hypercholesteremia   . Hypothyroidism   . PONV (postoperative nausea and vomiting)     Assessment/Plan: 1 Day Post-Op Procedure(s) (LRB): TOTAL  KNEE ARTHROPLASTY (Left) Principal Problem:   OA (osteoarthritis) of knee Active Problems:   Primary osteoarthritis of left knee  Estimated body mass index is 27.98 kg/m as calculated from the following:   Height as of this encounter: 5\' 8"  (1.727 m).   Weight as of this encounter: 83.5 kg. Advance diet Up with therapy D/C IV fluids   Patient's anticipated LOS is less than 2 midnights, meeting these requirements: - Lives within 1 hour of care - Has a competent adult at home to recover with post-op recover - NO history of  - Coronary Artery Disease  - Heart failure  - Heart attack  - Stroke  - DVT/VTE  - Cardiac arrhythmia  - Respiratory Failure/COPD  - Renal failure  - Anemia  - Advanced Liver disease  DVT Prophylaxis - Aspirin Weight bearing as tolerated. Continue therapy.  Plan is to go Home after hospital stay. Plan for discharge later today if cleared by physical therapy. Scheduled for OPPT at [facility]. Follow-up in the office July 27th.  The PDMP database was reviewed today (09/26/2019) prior to any opioid medications being prescribed to this patient.   Theresa Duty, PA-C Orthopedic Surgery [phone] 09/26/2019, 7:28 AM

## 2019-09-26 NOTE — Progress Notes (Signed)
Physical Therapy Treatment Patient Details Name: Ashley Perkins MRN: [id number] DOB: 11/13/1940 Today's Date: 09/26/2019    History of Present Illness Patient is 79 y.o. female s/p Lt TKA on 09/25/19 with PMH significant for fibromyaliga, OA, DM, hypothyroidism, back surgery.    PT Comments    Pt ambulated in hallway and practiced safe stair technique.  Pt provided with HEP and stair handouts.  Pt reports understanding and feels ready for d/c home today.  Follow Up Recommendations  Follow surgeon's recommendation for DC plan and follow-up therapies;Outpatient PT     Equipment Recommendations  Rolling walker with 5" wheels    Recommendations for Other Services       Precautions / Restrictions Precautions Precautions: Fall;Knee Restrictions Other Position/Activity Restrictions: WBAT    Mobility  Bed Mobility               General bed mobility comments: pt up in recliner  Transfers Overall transfer level: Needs assistance Equipment used: Rolling walker (2 wheeled) Transfers: Sit to/from Stand Sit to Stand: Min guard         General transfer comment: verbal cues for UE and LE positioning  Ambulation/Gait Ambulation/Gait assistance: Min guard   Assistive device: Rolling walker (2 wheeled) Gait Pattern/deviations: Step-to pattern;Decreased stride length;Decreased stance time - left;Decreased weight shift to left;Antalgic     General Gait Details: verbal cues for sequence, RW positioning, posture, step length   Stairs Stairs: Yes Stairs assistance: Min guard Stair Management: Step to pattern;Backwards;With walker Number of Stairs: 2 General stair comments: verbal cues for sequence, RW positioning, safety; pt performed twice and reports understanding   Wheelchair Mobility    Modified Rankin (Stroke Patients Only)       Balance                                            Cognition Arousal/Alertness: Awake/alert Behavior During  Therapy: WFL for tasks assessed/performed Overall Cognitive Status: Within Functional Limits for tasks assessed                                        Exercises      General Comments        Pertinent Vitals/Pain Pain Assessment: No/denies pain Pain Score: 4  Pain Location: Lt knee Pain Descriptors / Indicators: Aching;Discomfort;Sore Pain Intervention(s): Repositioned;Monitored during session    Home Living                      Prior Function            PT Goals (current goals can now be found in the care plan section)      Frequency    7X/week      PT Plan Current plan remains appropriate    Co-evaluation              AM-PAC PT "6 Clicks" Mobility   Outcome Measure  Help needed turning from your back to your side while in a flat bed without using bedrails?: A Little Help needed moving from lying on your back to sitting on the side of a flat bed without using bedrails?: A Little Help needed moving to and from a bed to a chair (including a wheelchair)?: A Little Help needed standing up from  a chair using your arms (e.g., wheelchair or bedside chair)?: A Little Help needed to walk in hospital room?: A Little Help needed climbing 3-5 steps with a railing? : A Little 6 Click Score: 18    End of Session Equipment Utilized During Treatment: Gait belt Activity Tolerance: Patient tolerated treatment well Patient left: in chair;with call bell/phone within reach;with chair alarm set   PT Visit Diagnosis: Muscle weakness (generalized) (M62.81);Difficulty in walking, not elsewhere classified (R26.2)     Time: 9483-4758 PT Time Calculation (min) (ACUTE ONLY): 27 min  Charges:  $Gait Training: 8-22 mins                    Arlyce Dice, DPT Acute Rehabilitation Services Pager: [phone] Office: [phone]   Trena Platt 09/26/2019, 4:19 PM

## 2019-09-26 NOTE — Progress Notes (Signed)
Physical Therapy Treatment Patient Details Name: Ashley Perkins MRN: [id number] DOB: 04/30/1940 Today's Date: 09/26/2019    History of Present Illness Patient is 79 y.o. female s/p Lt TKA on 09/25/19 with PMH significant for fibromyaliga, OA, DM, hypothyroidism, back surgery.    PT Comments    Pt ambulated in hallway and performed LE exercises.  Pt to practice steps this afternoon prior to d/c home.    Follow Up Recommendations  Follow surgeon's recommendation for DC plan and follow-up therapies;Outpatient PT     Equipment Recommendations  Rolling walker with 5" wheels    Recommendations for Other Services       Precautions / Restrictions Precautions Precautions: Fall;Knee Restrictions Other Position/Activity Restrictions: WBAT    Mobility  Bed Mobility Overal bed mobility: Needs Assistance Bed Mobility: Supine to Sit     Supine to sit: Min guard     General bed mobility comments: increased time and effort  Transfers Overall transfer level: Needs assistance Equipment used: Rolling walker (2 wheeled) Transfers: Sit to/from Stand Sit to Stand: Min guard         General transfer comment: verbal cues for UE and LE positioning  Ambulation/Gait Ambulation/Gait assistance: Min guard Gait Distance (Feet): 80 Feet Assistive device: Rolling walker (2 wheeled) Gait Pattern/deviations: Step-to pattern;Decreased stride length;Decreased stance time - left;Decreased weight shift to left Gait velocity: decr   General Gait Details: verbal cues for sequence, RW positioning, posture, step length   Stairs             Wheelchair Mobility    Modified Rankin (Stroke Patients Only)       Balance                                            Cognition Arousal/Alertness: Awake/alert Behavior During Therapy: WFL for tasks assessed/performed Overall Cognitive Status: Within Functional Limits for tasks assessed                                         Exercises Total Joint Exercises Ankle Circles/Pumps: AROM;Both;10 reps Quad Sets: AROM;Both;10 reps Short Arc QuadSinclair Ship;Left;10 reps Heel Slides: AAROM;Left;10 reps Hip ABduction/ADduction: AROM;Left;10 reps Straight Leg Raises: Left;10 reps;AROM Knee Flexion: AAROM;Left;10 reps;Seated    General Comments        Pertinent Vitals/Pain Pain Assessment: 0-10 Pain Score: 4  Pain Location: Lt knee Pain Descriptors / Indicators: Aching;Discomfort;Sore Pain Intervention(s): Repositioned;Monitored during session;Ice applied    Home Living                      Prior Function            PT Goals (current goals can now be found in the care plan section) Progress towards PT goals: Progressing toward goals    Frequency    7X/week      PT Plan Current plan remains appropriate    Co-evaluation              AM-PAC PT "6 Clicks" Mobility   Outcome Measure  Help needed turning from your back to your side while in a flat bed without using bedrails?: A Little Help needed moving from lying on your back to sitting on the side of a flat bed without using bedrails?: A Little Help  needed moving to and from a bed to a chair (including a wheelchair)?: A Little Help needed standing up from a chair using your arms (e.g., wheelchair or bedside chair)?: A Little Help needed to walk in hospital room?: A Little Help needed climbing 3-5 steps with a railing? : A Little 6 Click Score: 18    End of Session Equipment Utilized During Treatment: Gait belt Activity Tolerance: Patient tolerated treatment well Patient left: in chair;with call bell/phone within reach;with chair alarm set   PT Visit Diagnosis: Muscle weakness (generalized) (M62.81);Difficulty in walking, not elsewhere classified (R26.2)     Time: 9628-3662 PT Time Calculation (min) (ACUTE ONLY): 26 min  Charges:  $Gait Training: 8-22 mins $Therapeutic Exercise: 8-22 mins                      Jannette Spanner PT, DPT Acute Rehabilitation Services Pager: [phone] Office: [phone]  Lynkin Saini,KATHrine E 09/26/2019, 11:28 AM

## 2019-09-27 DIAGNOSIS — R110 Nausea: Secondary | ICD-10-CM | POA: Diagnosis present

## 2019-09-27 DIAGNOSIS — Z96652 Presence of left artificial knee joint: Secondary | ICD-10-CM | POA: Diagnosis not present

## 2019-09-27 DIAGNOSIS — K5903 Drug induced constipation: Secondary | ICD-10-CM | POA: Insufficient documentation

## 2019-09-27 DIAGNOSIS — E119 Type 2 diabetes mellitus without complications: Secondary | ICD-10-CM | POA: Diagnosis not present

## 2019-09-27 DIAGNOSIS — E039 Hypothyroidism, unspecified: Secondary | ICD-10-CM | POA: Insufficient documentation

## 2019-09-27 DIAGNOSIS — E871 Hypo-osmolality and hyponatremia: Secondary | ICD-10-CM | POA: Insufficient documentation

## 2019-09-28 ENCOUNTER — Encounter (HOSPITAL_COMMUNITY): Payer: Self-pay | Admitting: Emergency Medicine

## 2019-09-28 ENCOUNTER — Emergency Department (HOSPITAL_COMMUNITY): Payer: Medicare HMO

## 2019-09-28 ENCOUNTER — Emergency Department (HOSPITAL_COMMUNITY)
Admission: EM | Admit: 2019-09-28 | Discharge: 2019-09-28 | Disposition: A | Discharge status: Home / Self Care | Payer: Medicare HMO | Attending: Emergency Medicine | Admitting: Emergency Medicine

## 2019-09-28 ENCOUNTER — Other Ambulatory Visit: Payer: Self-pay

## 2019-09-28 DIAGNOSIS — K5903 Drug induced constipation: Secondary | ICD-10-CM

## 2019-09-28 DIAGNOSIS — E871 Hypo-osmolality and hyponatremia: Secondary | ICD-10-CM

## 2019-09-28 DIAGNOSIS — R110 Nausea: Secondary | ICD-10-CM

## 2019-09-28 LAB — CBC
HCT: 33.9 % — ABNORMAL LOW (ref 36.0–46.0)
Hemoglobin: 11.4 g/dL — ABNORMAL LOW (ref 12.0–15.0)
MCH: 30.8 pg (ref 26.0–34.0)
MCHC: 33.6 g/dL (ref 30.0–36.0)
MCV: 91.6 fL (ref 80.0–100.0)
Platelets: 210 10*3/uL (ref 150–400)
RBC: 3.7 MIL/uL — ABNORMAL LOW (ref 3.87–5.11)
RDW: 13.3 % (ref 11.5–15.5)
WBC: 8.9 10*3/uL (ref 4.0–10.5)
nRBC: 0 % (ref 0.0–0.2)

## 2019-09-28 LAB — COMPREHENSIVE METABOLIC PANEL
ALT: 17 U/L (ref 0–44)
AST: 27 U/L (ref 15–41)
Albumin: 3.7 g/dL (ref 3.5–5.0)
Alkaline Phosphatase: 79 U/L (ref 38–126)
Anion gap: 9 (ref 5–15)
BUN: 10 mg/dL (ref 8–23)
CO2: 29 mmol/L (ref 22–32)
Calcium: 8.8 mg/dL — ABNORMAL LOW (ref 8.9–10.3)
Chloride: 89 mmol/L — ABNORMAL LOW (ref 98–111)
Creatinine, Ser: 0.7 mg/dL (ref 0.44–1.00)
GFR calc Af Amer: >60 mL/min (ref >60)
GFR calc non Af Amer: >60 mL/min (ref >60)
Glucose, Bld: 228 mg/dL — ABNORMAL HIGH (ref 70–99)
Potassium: 4.4 mmol/L (ref 3.5–5.1)
Sodium: 127 mmol/L — ABNORMAL LOW (ref 135–145)
Total Bilirubin: 0.7 mg/dL (ref 0.3–1.2)
Total Protein: 6.8 g/dL (ref 6.5–8.1)

## 2019-09-28 LAB — URINALYSIS, ROUTINE W REFLEX MICROSCOPIC
Bacteria, UA: NONE SEEN
Bilirubin Urine: NEGATIVE
Glucose, UA: >=500 mg/dL — AB
Ketones, ur: NEGATIVE mg/dL
Leukocytes,Ua: NEGATIVE
Nitrite: NEGATIVE
Protein, ur: NEGATIVE mg/dL
Specific Gravity, Urine: 1.002 — ABNORMAL LOW (ref 1.005–1.030)
pH: 6 (ref 5.0–8.0)

## 2019-09-28 LAB — LIPASE, BLOOD: Lipase: 18 U/L (ref 11–51)

## 2019-09-28 MED ORDER — SODIUM CHLORIDE 0.9 % IV SOLN
1000.0000 mL | INTRAVENOUS | Status: DC
  Administered 2019-09-28: 1000 mL via INTRAVENOUS

## 2019-09-28 MED ORDER — SODIUM CHLORIDE 0.9 % IV BOLUS (SEPSIS)
1000.0000 mL | Freq: Once | INTRAVENOUS | Status: AC
  Administered 2019-09-28: 1000 mL via INTRAVENOUS

## 2019-09-28 MED ORDER — ONDANSETRON HCL 4 MG/2ML IJ SOLN
4.0000 mg | Freq: Once | INTRAMUSCULAR | Status: AC | PRN
  Administered 2019-09-28: 4 mg via INTRAVENOUS
  Filled 2019-09-28: qty 2

## 2019-09-28 MED ORDER — SODIUM CHLORIDE 0.9% FLUSH
3.0000 mL | Freq: Once | INTRAVENOUS | Status: AC
  Administered 2019-09-28: 3 mL via INTRAVENOUS

## 2019-09-28 MED ORDER — ONDANSETRON 4 MG PO TBDP
4.0000 mg | ORAL_TABLET | Freq: Three times a day (TID) | ORAL | 0 refills | Status: AC | PRN
Start: 2019-09-28 — End: 2019-10-01

## 2019-09-28 NOTE — ED Notes (Signed)
Patient transported to X-ray 

## 2019-09-28 NOTE — ED Provider Notes (Signed)
[facility] DEPT Provider Note  CSN: [id number] Arrival date & time: 09/27/19 2348  Chief Complaint(s) Emesis and Knee Pain  HPI Ashley Perkins is a 79 y.o. female with a past medical history listed below who presents to the emergency department for nausea.  Patient had total left knee replacement performed 3 days ago.  She was discharged the following day.  Yesterday patient awoke feeling fatigued and with severe nausea.  She has also had decreased appetite due to her nausea.  Her nausea is alleviated slightly by ODT Zofran.  She denies any emesis.  No abdominal pain.  She did report no bowel movements since the day prior to surgery but is still passing gas.   Patient reports history of nausea following anesthesia but never this severe.  With regards to the patient surgery, she is endorsing left knee pain only with range of motion.  Currently she denies any knee pain.  HPI  Past Medical History Past Medical History:  Diagnosis Date  . Arthritis    knees,  . Complication of anesthesia   . Diabetes mellitus without complication ([place])   . Fibromyalgia   . Hypercholesteremia   . Hypothyroidism   . PONV (postoperative nausea and vomiting)    Patient Active Problem List   Diagnosis Date Noted  . OA (osteoarthritis) of knee 09/25/2019  . Primary osteoarthritis of left knee 09/25/2019  . Elevated liver enzymes   . Recurrent acute pancreatitis 05/03/2018  . PMR (polymyalgia rheumatica) ([facility]) 05/03/2018  . Pancreatitis 05/03/2018  . Fibromyalgia 07/15/2017  . Primary osteoarthritis of both hands 07/15/2017  . Primary osteoarthritis of both knees 07/15/2017  . DDD (degenerative disc disease), cervical 07/15/2017  . DDD (degenerative disc disease), lumbar 07/15/2017  . History of knee surgery 07/15/2017  . Elevated liver function tests   . Generalized abdominal pain   . Depression   . Acute pancreatitis 05/07/2014  . Transaminitis 05/07/2014  . DM2  (diabetes mellitus, type 2) ([place]) 05/07/2014   Home Medication(s) Prior to Admission medications   Medication Sig Start Date End Date Taking? Authorizing Provider  amitriptyline (ELAVIL) 75 MG tablet Take 75 mg by mouth at bedtime.   Yes [provider]  aspirin EC 325 MG EC tablet Take 1 tablet (325 mg total) by mouth 2 (two) times daily for 20 days. Then take one 81 mg aspirin once a day for three weeks. Then discontinue aspirin. 09/26/19 10/16/19 Yes Edmisten, Kristie L, PA  B Complex Vitamins (VITAMIN B COMPLEX PO) Take 1 tablet by mouth daily.   Yes [provider]  cholecalciferol (VITAMIN D3) 25 MCG (1000 UNIT) tablet Take 1,000 Units by mouth daily.   Yes [provider]  gabapentin (NEURONTIN) 300 MG capsule Take 300 mg by mouth 3 (three) times daily. 04/06/18  Yes [provider]  glimepiride (AMARYL) 2 MG tablet Take 2 mg by mouth daily with breakfast.  11/05/16  Yes [provider]  levothyroxine (SYNTHROID, LEVOTHROID) 88 MCG tablet Take 88 mcg by mouth daily before breakfast. 02/13/14  Yes [provider]  metFORMIN (GLUCOPHAGE) 500 MG tablet Take 1,000 mg by mouth 2 (two) times daily with a meal.    Yes [provider]  methocarbamol (ROBAXIN) 500 MG tablet Take 1 tablet (500 mg total) by mouth every 6 (six) hours as needed for muscle spasms. 09/26/19  Yes Edmisten, Kristie L, PA  Multiple Vitamin (MULTIVITAMIN WITH MINERALS) TABS Take 1 tablet by mouth daily.   Yes [provider]  oxyCODONE (OXY IR/ROXICODONE) 5 MG immediate release tablet Take 1-2 tablets (5-10 mg total) by mouth every 6 (six) hours as needed for severe pain. 09/26/19  Yes Edmisten, Kristie L, PA  pioglitazone (ACTOS) 15 MG tablet Take 15 mg by mouth daily.  11/05/16  Yes [provider]  traMADol (ULTRAM) 50 MG tablet Take 1-2 tablets (50-100 mg total) by mouth every 6 (six) hours as needed for moderate pain. 09/26/19  Yes Edmisten, Kristie L,  PA  ondansetron (ZOFRAN ODT) 4 MG disintegrating tablet Take 1 tablet (4 mg total) by mouth every 8 (eight) hours as needed for up to 3 days for nausea or vomiting. 09/28/19 10/01/19  Mansur Patti, Grayce Sessions, MD                                                                                                                                    Past Surgical History Past Surgical History:  Procedure Laterality Date  . BACK SURGERY    . CHOLECYSTECTOMY    . KNEE ARTHROSCOPY  06/07/2017  . LAPAROSCOPIC UNILATERAL SALPINGO OOPHERECTOMY    . TOTAL KNEE ARTHROPLASTY Left 09/25/2019   Procedure: TOTAL KNEE ARTHROPLASTY;  Surgeon: Gaynelle Arabian, MD;  Location: [facility];  Service: Orthopedics;  Laterality: Left;  . TUBAL LIGATION     Family History Family History  Problem Relation Age of Onset  . Dementia Mother   . Diabetes Father   . Cancer Father   . Heart Problems Father   . Cancer Brother        prostate  . Heart Problems Brother     Social History Social History   Tobacco Use  . Smoking status: Never Smoker  . Smokeless tobacco: Never Used  Vaping Use  . Vaping Use: Never used  Substance Use Topics  . Alcohol use: No  . Drug use: No   Allergies Hydroxychloroquine, Hydroxychloroquine sulfate, and Amoxicillin-pot clavulanate  Review of Systems Review of Systems All other systems are reviewed and are negative for acute change except as noted in the HPI  Physical Exam Vital Signs  I have reviewed the triage vital signs BP (!) 154/76   Pulse 93   Temp 98.4 F (36.9 C) (Oral)   Resp 18   Ht 5\' 8"  (1.727 m)   Wt 83 kg   SpO2 97%   BMI 27.83 kg/m   Physical Exam Vitals reviewed.  Constitutional:      General: She is not in acute distress.    Appearance: She is well-developed. She is not diaphoretic.  HENT:     Head: Normocephalic and atraumatic.     Nose: Nose normal.  Eyes:     General: No scleral icterus.       Right eye: No discharge.        Left eye: No  discharge.     Conjunctiva/sclera: Conjunctivae normal.     Pupils: Pupils are  equal, round, and reactive to light.  Cardiovascular:     Rate and Rhythm: Normal rate and regular rhythm.     Heart sounds: No murmur heard.  No friction rub. No gallop.   Pulmonary:     Effort: Pulmonary effort is normal. No respiratory distress.     Breath sounds: Normal breath sounds. No stridor. No rales.  Abdominal:     General: There is no distension.     Palpations: Abdomen is soft.     Tenderness: There is no abdominal tenderness.  Musculoskeletal:        General: No tenderness.     Cervical back: Normal range of motion and neck supple.       Legs:  Skin:    General: Skin is warm and dry.     Findings: No erythema or rash.  Neurological:     Mental Status: She is alert and oriented to person, place, and time.     ED Results and Treatments Labs (all labs ordered are listed, but only abnormal results are displayed) Labs Reviewed  COMPREHENSIVE METABOLIC PANEL - Abnormal; Notable for the following components:      Result Value   Sodium 127 (*)    Chloride 89 (*)    Glucose, Bld 228 (*)    Calcium 8.8 (*)    All other components within normal limits  CBC - Abnormal; Notable for the following components:   RBC 3.70 (*)    Hemoglobin 11.4 (*)    HCT 33.9 (*)    All other components within normal limits  URINALYSIS, ROUTINE W REFLEX MICROSCOPIC - Abnormal; Notable for the following components:   Color, Urine STRAW (*)    Specific Gravity, Urine 1.002 (*)    Glucose, UA >=500 (*)    Hgb urine dipstick SMALL (*)    All other components within normal limits  LIPASE, BLOOD                                                                                                                         EKG  EKG Interpretation  Date/Time:    Ventricular Rate:    PR Interval:    QRS Duration:   QT Interval:    QTC Calculation:   R Axis:     Text Interpretation:        Radiology DG ABD  ACUTE 2+V W 1V CHEST  Result Date: 09/28/2019 CLINICAL DATA:  79 year old female status post knee surgery three days ago, with no bowel movement. EXAM: DG ABDOMEN ACUTE W/ 1V CHEST COMPARISON:  Chest radiographs 05/07/2014. CT Abdomen and Pelvis 11/17/2017. FINDINGS: AP upright view of the chest. Lung volumes and mediastinal contours are stable. Small chronic hiatal hernia. Visualized tracheal air column is within normal limits. Both lungs appear clear. No pneumothorax or pneumoperitoneum. Upright and supine views of the abdomen and pelvis. Paucity of bowel gas, although gas is present in the rectum. Moderate to large volume of retained stool in the  large bowel. No dilated small bowel. Stable cholecystectomy clips. No acute osseous abnormality identified. IMPRESSION: 1. Nonobstructed bowel-gas pattern with a moderate to large volume of retained stool in the large bowel. No free air. 2. No acute cardiopulmonary abnormality.  Small hiatal hernia. Electronically Signed   By: Genevie Ann M.D.   On: 09/28/2019 03:54    Pertinent labs & imaging results that were available during my care of the patient were reviewed by me and considered in my medical decision making (see chart for details).  Medications Ordered in ED Medications  sodium chloride 0.9 % bolus 1,000 mL (1,000 mLs Intravenous New Bag/Given 09/28/19 0351)    Followed by  0.9 %  sodium chloride infusion (1,000 mLs Intravenous New Bag/Given 09/28/19 0351)  sodium chloride flush (NS) 0.9 % injection 3 mL (3 mLs Intravenous Given 09/28/19 0350)  ondansetron (ZOFRAN) injection 4 mg (4 mg Intravenous Given 09/28/19 0350)                                                                                                                                    Procedures Procedures  (including critical care time)  Medical Decision Making / ED Course I have reviewed the nursing notes for this encounter and the patient's prior records (if available in EHR or on  provided paperwork).   TAKEYAH WIEMAN was evaluated in Emergency Department on 09/28/2019 for the symptoms described in the history of present illness. She was evaluated in the context of the global COVID-19 pandemic, which necessitated consideration that the patient might be at risk for infection with the SARS-CoV-2 virus that causes COVID-19. Institutional protocols and algorithms that pertain to the evaluation of patients at risk for COVID-19 are in a state of rapid change based on information released by regulatory bodies including the CDC and federal and state organizations. These policies and algorithms were followed during the patient's care in the ED.  Patient presents with nausea following total left knee replacement.  No emesis.  Patient still tolerating fluids. Abdomen benign.  Surgical site is well-appearing.  Nontender to palpation.  Does not appear to be infected.  Screening labs obtained and notable for mild hyponatremia.  Acute abdominal series obtain notable for moderate to severe stool burden consistent with constipation.  No evidence concerning for small bowel obstruction.  Patient treated symptomatically with IV Zofran, IV fluids.  Patient has significant improvement in her symptoms and was able to tolerate oral intake.  She and husband felt well enough to be discharged.      Final Clinical Impression(s) / ED Diagnoses Final diagnoses:  Nausea  Nausea after anesthesia, initial encounter  Drug-induced constipation  Hyponatremia    The patient appears reasonably screened and/or stabilized for discharge and I doubt any other medical condition or other [facility] requiring further screening, evaluation, or treatment in the ED at this time prior to discharge. Safe for discharge with strict return precautions.  Disposition: Discharge  Condition: Good  I have discussed the results, Dx and Tx plan with the patient/family who expressed understanding and agree(s) with the plan.  Discharge instructions discussed at length. The patient/family was given strict return precautions who verbalized understanding of the instructions. No further questions at time of discharge.    ED Discharge Orders         Ordered    ondansetron (ZOFRAN ODT) 4 MG disintegrating tablet  Every 8 hours PRN     Discontinue  Reprint     09/28/19 0375            Follow Up: Gregor Hams, [place] [address] [phone]  Schedule an appointment as soon as possible for a visit  As needed     This chart was dictated using voice recognition software.  Despite best efforts to proofread,  errors can occur which can change the documentation meaning.   Fatima Blank, MD 09/28/19 [phone]

## 2019-09-28 NOTE — ED Notes (Signed)
Pt provided Diet Ginger Ale for PO challenge.  Also, both liters of NS running wide open.

## 2019-09-28 NOTE — ED Notes (Signed)
Verbalized understanding discharge instructions, prescriptions, and follow-up. In no acute distress.   

## 2019-09-28 NOTE — Discharge Instructions (Signed)
Please increase your intake of sodium for the next 4 to 5 days.  Follow-up with your primary care provider to recheck your sodium level.  For pain control, take at 1000 mg of Tylenol every 8 hours scheduled.  On top of that you can take your hydrocodone/acetaminophen (half to 1 tablet) every 8 hours if pain not controlled with the scheduled Tylenol.  If this does not control your pain, you can increase your hydrocodone frequency but decrease your scheduled Tylenol dosing.  Do not take more than 4000 mg of Tylenol in a 24-hour.  For the constipation take 3 capfuls of MiraLAX daily until you have a good bowel movement.  After that you may start titrating down.

## 2019-09-28 NOTE — ED Notes (Signed)
Unable to collect labs in triage. Pt husband state he is taking wife home due to wait time and no provider in triage. Info visitor of wait time and reason wife stay in a triage room. Once room became available PT transfer to room to be seen.

## 2019-09-28 NOTE — ED Notes (Signed)
Per EDP, Pt will be going home, but needs to get fluids first.  Also, PO challenge.

## 2019-09-28 NOTE — ED Triage Notes (Signed)
Patient is complaining of left knee pain. Patient had total knee replacement on Monday. Patient does not know the last time she had a bowel movement. Patient has been throwing up and the pain medication she has is not working for the pain.

## 2019-10-06 DIAGNOSIS — Z96652 Presence of left artificial knee joint: Secondary | ICD-10-CM | POA: Insufficient documentation

## 2020-03-27 DIAGNOSIS — E7800 Pure hypercholesterolemia, unspecified: Secondary | ICD-10-CM | POA: Insufficient documentation

## 2020-04-16 IMAGING — MR MR ABDOMEN WO/W CM MRCP
11 of 18 series · 27 of 48 positions shown · IV contrast (multihance)
Comparison: CT the abdomen and pelvis 11/17/2017.

CLINICAL DATA: 77-year-old female with history of pancreatitis.

EXAM:
MRI ABDOMEN WITHOUT AND WITH CONTRAST (INCLUDING MRCP)
TECHNIQUE: Multiplanar multisequence MR imaging of the abdomen was performed
both before and after the administration of intravenous contrast.
Heavily T2-weighted images of the biliary and pancreatic ducts were
obtained, and three-dimensional MRCP images were rendered by post
processing.
CONTRAST:  15mL MULTIHANCE GADOBENATE DIMEGLUMINE 529 MG/ML IV SOLN

[Series 2: T2 · coronal · 5.0mm · 1.37mm/px · 2 of 32 slices shown (1 of 3)]
[im 1/32]
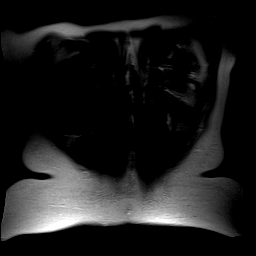
[im 32/32]
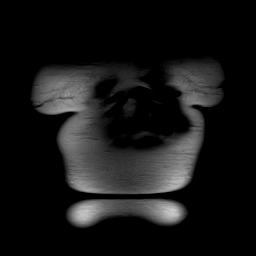

[Series 3: T2 · axial · 5.0mm · 1.37mm/px · z∈[-137,+69]mm · 2 of 34 slices shown (2 of 3)]
[im 1/34]
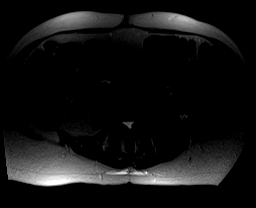
[im 34/34]
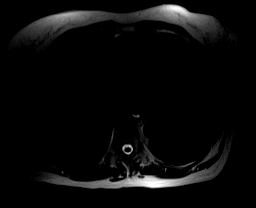

[Series 4: MRCP fat-sat · coronal · 3.0mm · 0.70mm/px · 2 of 22 slices shown]
[im 1/22]
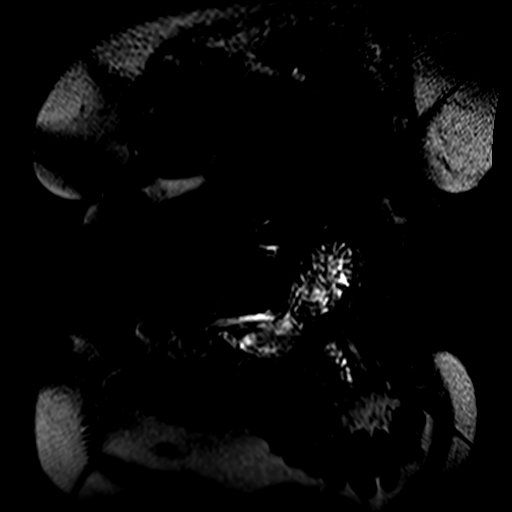
[im 22/22]
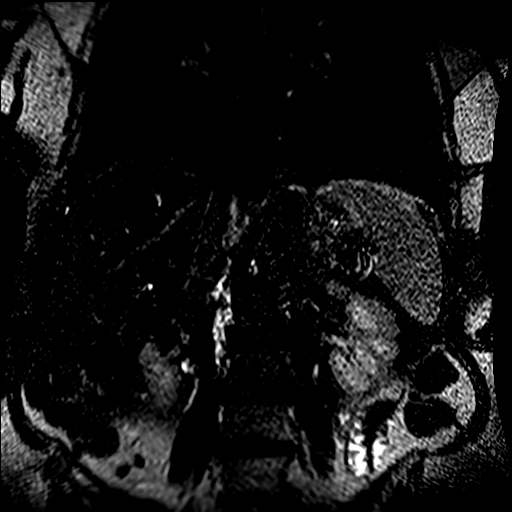

[Series 5: axial in out · axial · 5.5mm · 0.68mm/px · z∈[-139,+70]mm · 3 of 68 slices shown]
[im 1/68]
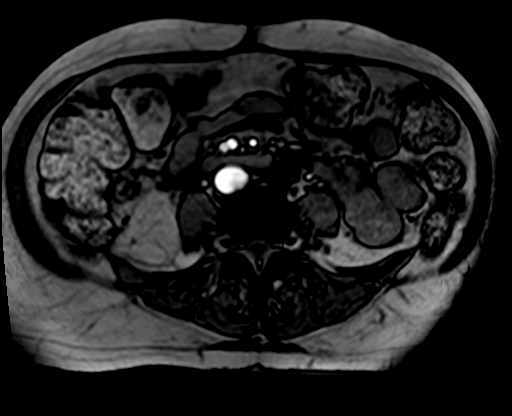
[im 34/68]
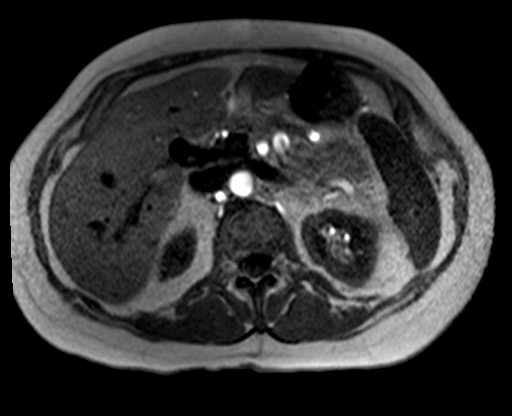
[im 68/68]
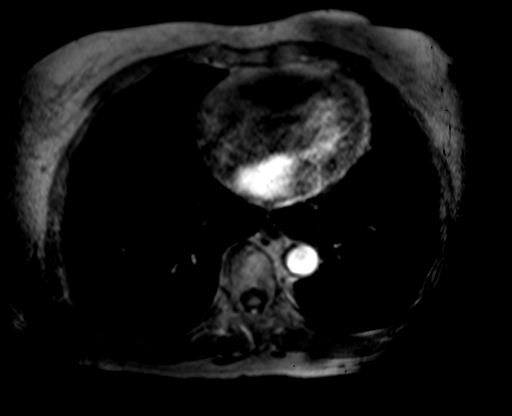

[Series 6: T2 · axial · 5.0mm · 0.72mm/px · z∈[-112,+119]mm · 2 of 38 slices shown (3 of 3)]
[im 1/38]
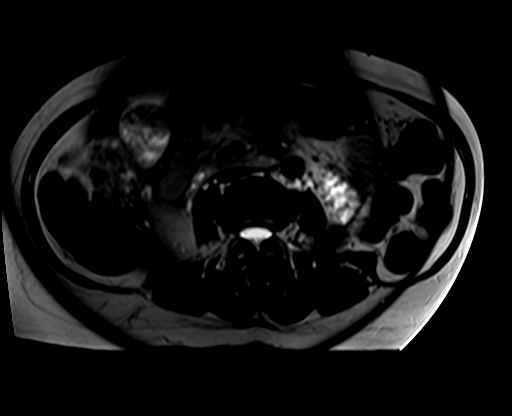
[im 38/38]
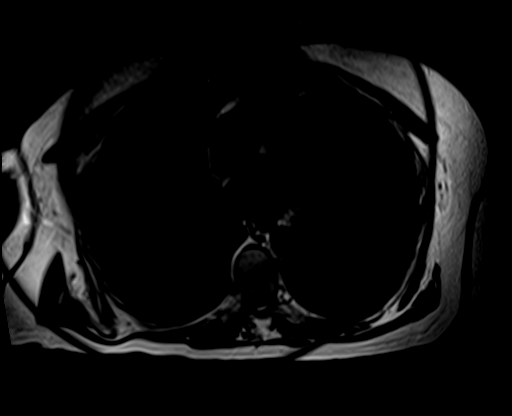

[Series 11: ep2d_diff_b50_500_800_p2_trig · axial · 5.0mm · 1.82mm/px · z∈[-113,+118]mm · 4 of 110 slices shown]
[im 1/110]
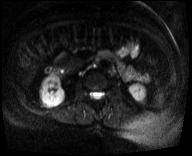
[im 37/110]
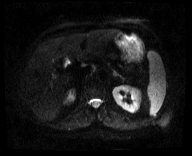
[im 73/110]
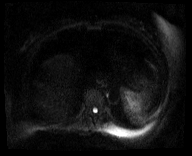
[im 110/110]
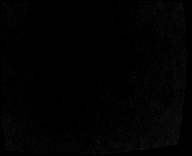

[Series 12: ep2d_[id number]_500_800_p2_trig_adc · axial · 5.0mm · 1.82mm/px · 1 of 38 slices shown]
[im 1/38]
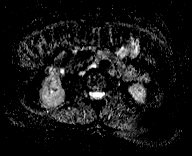

[Series 13: T1 dynamic · axial · non-contrast · 2.0mm · 0.78mm/px · z∈[-134,+71]mm · 3 of 104 slices shown]
[im 1/104]
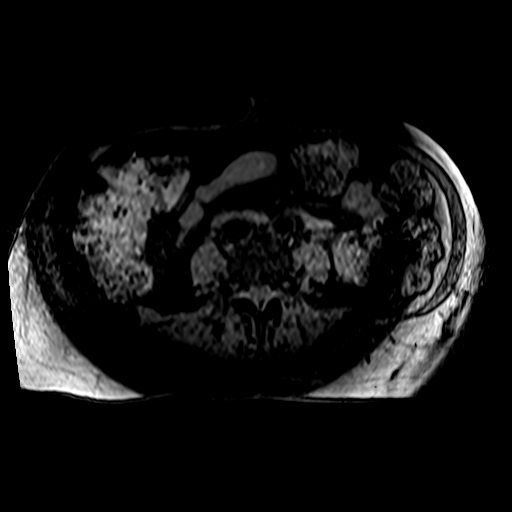
[im 52/104]
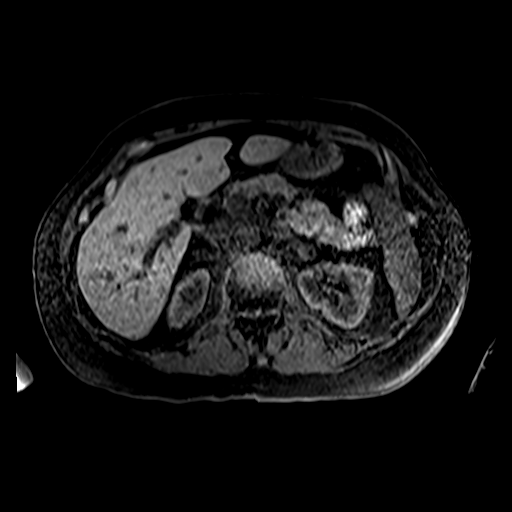
[im 104/104]
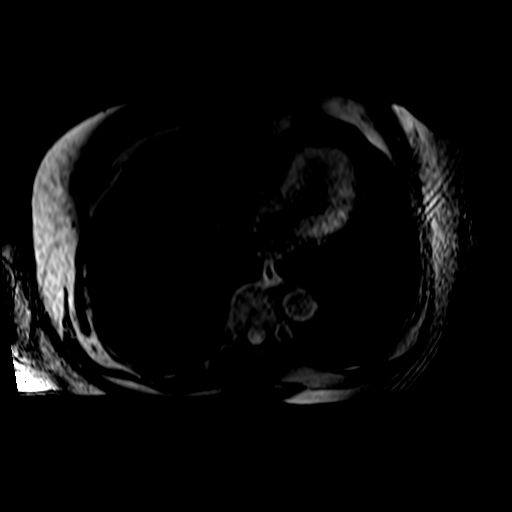

[Series 14: post 25 sec · axial · 2.0mm · 0.78mm/px · z∈[-134,+71]mm · 3 of 104 slices shown]
[im 1/104]
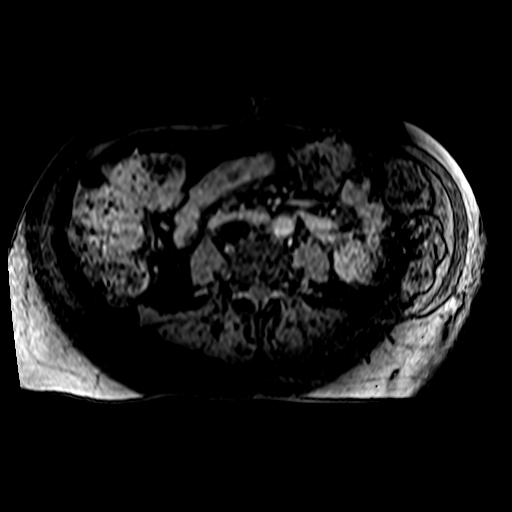
[im 52/104]
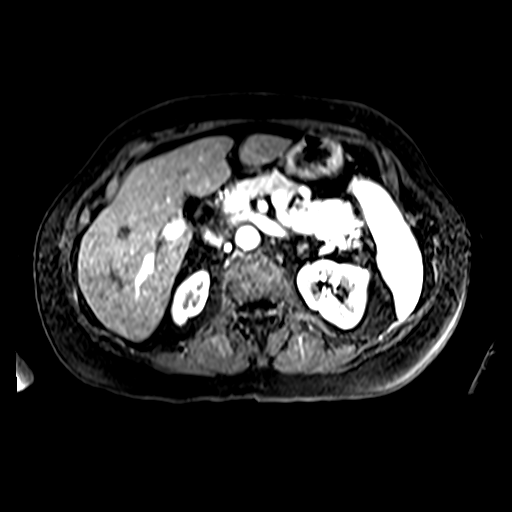
[im 104/104]
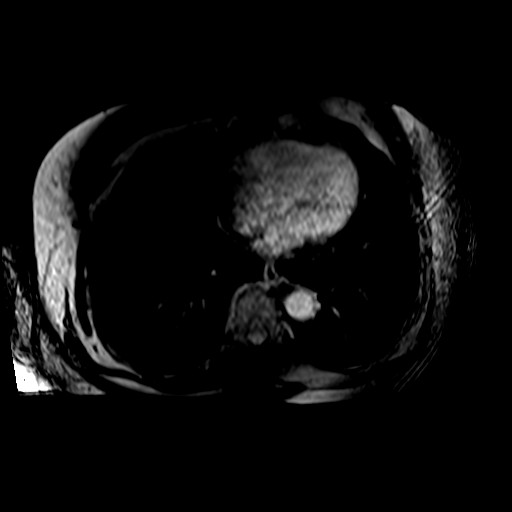

[Series 15: post 25 sec_sub · axial · 2.0mm · 0.78mm/px · z∈[-134,+71]mm · 3 of 104 slices shown]
[im 1/104]
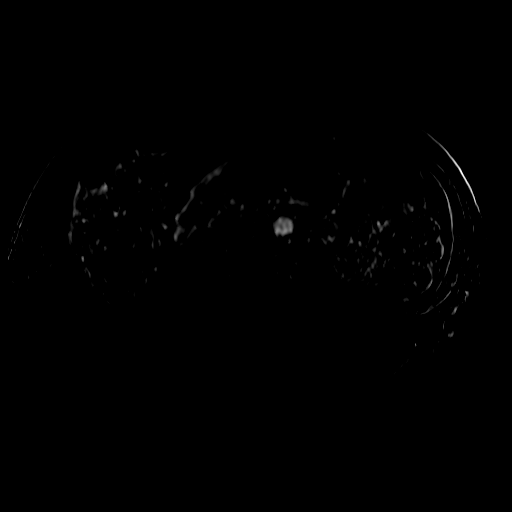
[im 52/104]
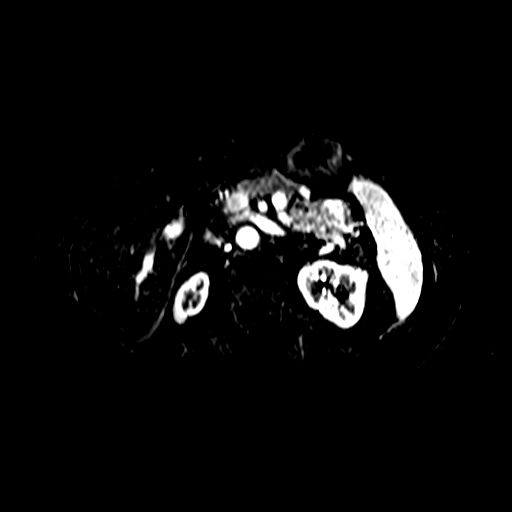
[im 104/104]
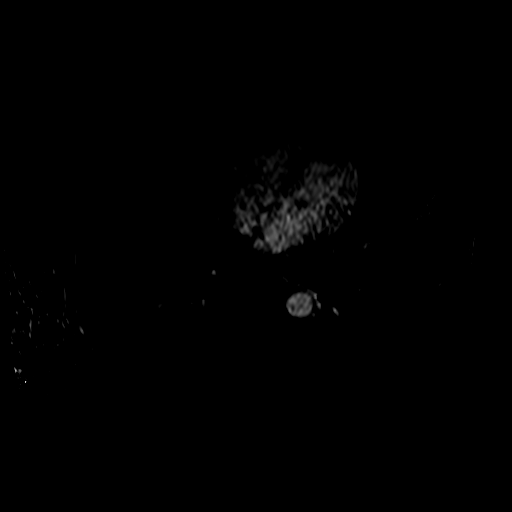

[Series 16: post 45 sec · axial · 2.0mm · 0.78mm/px · z∈[-134,-32]mm · 2 of 104 slices shown]
[im 1/104]
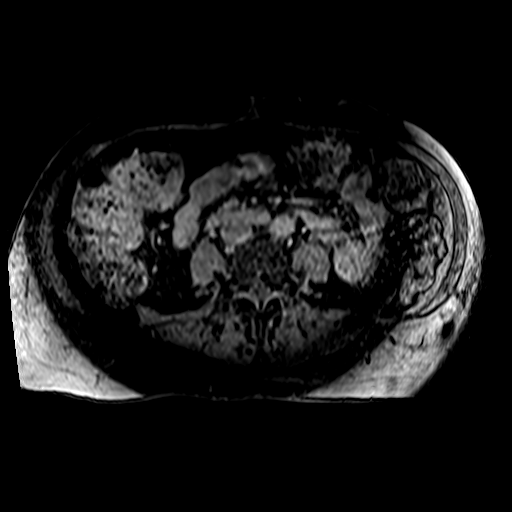
[im 52/104]
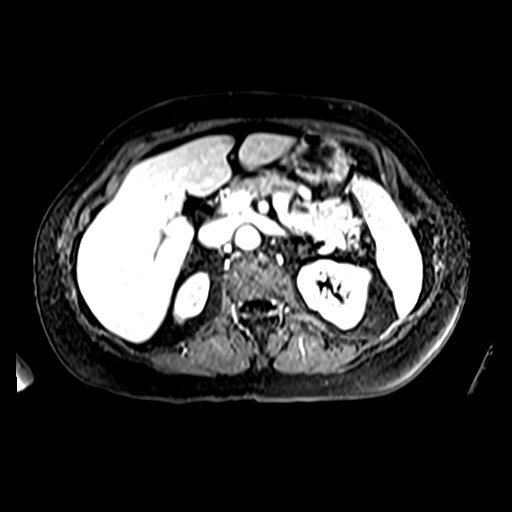

[27 of 48 positions shown; findings below may reference images not displayed]

FINDINGS: Lower chest: Unremarkable.

Hepatobiliary: No suspicious cystic or solid hepatic lesions. No
intrahepatic biliary ductal dilatation. Common bile duct measures 9
mm in the porta hepatis. No filling defects in the common bile duct
to suggest choledocholithiasis. Status post cholecystectomy.

Pancreas: No pancreatic mass. No pancreatic ductal dilatation noted
on MRCP images. No pancreatic or peripancreatic fluid or
inflammatory changes.

Spleen:  Unremarkable.

Adrenals/Urinary Tract: Bilateral kidneys and bilateral adrenal
glands are normal in appearance. No hydroureteronephrosis in the
visualized portions of the abdomen.

Stomach/Bowel: Visualized portions are unremarkable.

Vascular/Lymphatic: No aneurysm identified in the visualized
abdominal vasculature. No lymphadenopathy noted in the abdomen.

Other: No significant volume of ascites noted in the visualized
portions of the peritoneal cavity.

Musculoskeletal: No aggressive appearing osseous lesions are noted
in the visualized portions of the skeleton.
IMPRESSION: 1. No findings to suggest acute pancreatitis at this time.
2. Very mild extrahepatic biliary ductal dilatation. No intrahepatic
biliary ductal dilatation. No choledocholithiasis or obstructing
pancreatic head lesion. These findings are compatible with benign
post cholecystectomy physiology.

## 2020-05-15 NOTE — H&P (Signed)
TOTAL KNEE ADMISSION H&P  Patient is being admitted for right total knee arthroplasty.  Subjective:  Chief Complaint: Right knee pain.  HPI: Ashley Perkins, 80 y.o. female has a history of pain and functional disability in the right knee due to arthritis and has failed non-surgical conservative treatments for greater than 12 weeks to include corticosteriod injections, viscosupplementation injections and activity modification. Onset of symptoms was gradual, starting several years ago with gradually worsening course since that time. The patient noted no past surgery on the right knee.  Patient currently rates pain in the right knee at 8 out of 10 with activity. Patient has worsening of pain with activity and weight bearing, pain that interferes with activities of daily living, crepitus and instability. Patient has evidence of near bone-on-bone arthritis in the lateral and patellofemoral compartments with marginal osteophyte formation and slight valgus deformity by imaging studies. There is no active infection.  Patient Active Problem List   Diagnosis Date Noted  . OA (osteoarthritis) of knee 09/25/2019  . Primary osteoarthritis of left knee 09/25/2019  . Elevated liver enzymes   . Recurrent acute pancreatitis 05/03/2018  . PMR (polymyalgia rheumatica) ([facility]) 05/03/2018  . Pancreatitis 05/03/2018  . Fibromyalgia 07/15/2017  . Primary osteoarthritis of both hands 07/15/2017  . Primary osteoarthritis of both knees 07/15/2017  . DDD (degenerative disc disease), cervical 07/15/2017  . DDD (degenerative disc disease), lumbar 07/15/2017  . History of knee surgery 07/15/2017  . Elevated liver function tests   . Generalized abdominal pain   . Depression   . Acute pancreatitis 05/07/2014  . Transaminitis 05/07/2014  . DM2 (diabetes mellitus, type 2) ([place]) 05/07/2014    Past Medical History:  Diagnosis Date  . Arthritis    knees,  . Complication of anesthesia   . Diabetes mellitus without  complication ([place])   . Fibromyalgia   . Hypercholesteremia   . Hypothyroidism   . PONV (postoperative nausea and vomiting)     Past Surgical History:  Procedure Laterality Date  . BACK SURGERY    . CHOLECYSTECTOMY    . KNEE ARTHROSCOPY  06/07/2017  . LAPAROSCOPIC UNILATERAL SALPINGO OOPHERECTOMY    . TOTAL KNEE ARTHROPLASTY Left 09/25/2019   Procedure: TOTAL KNEE ARTHROPLASTY;  Surgeon: Gaynelle Arabian, MD;  Location: [facility];  Service: Orthopedics;  Laterality: Left;  . TUBAL LIGATION      Prior to Admission medications   Medication Sig Start Date End Date Taking? Authorizing Provider  amitriptyline (ELAVIL) 75 MG tablet Take 75 mg by mouth at bedtime.   Yes [provider]  atorvastatin (LIPITOR) 10 MG tablet Take 10 mg by mouth daily.   Yes [provider]  B Complex Vitamins (VITAMIN B COMPLEX PO) Take 1 tablet by mouth every evening.   Yes [provider]  cholecalciferol (VITAMIN D3) 25 MCG (1000 UNIT) tablet Take 1,000 Units by mouth daily.   Yes [provider]  gabapentin (NEURONTIN) 300 MG capsule Take 300 mg by mouth 4 (four) times daily. 04/06/18  Yes [provider]  glimepiride (AMARYL) 2 MG tablet Take 2 mg by mouth in the morning and at bedtime. 11/05/16  Yes [provider]  HYDROcodone-acetaminophen (NORCO/VICODIN) 5-325 MG tablet Take 0.5-1 tablets by mouth daily as needed for moderate pain.   Yes [provider]  levothyroxine (SYNTHROID, LEVOTHROID) 88 MCG tablet Take 88 mcg by mouth daily before breakfast. 02/13/14  Yes [provider]  metFORMIN (GLUCOPHAGE) 500 MG tablet Take 1,000 mg by mouth 2 (  two) times daily with a meal.    Yes [provider]  Multiple Vitamin (MULTIVITAMIN WITH MINERALS) TABS Take 1 tablet by mouth daily.   Yes [provider]  pioglitazone (ACTOS) 15 MG tablet Take 15 mg by mouth daily.  11/05/16  Yes [provider]  vitamin B-12 (CYANOCOBALAMIN)  1000 MCG tablet Take 1,000 mcg by mouth daily.   Yes [provider]    Allergies  Allergen Reactions  . Hydroxychloroquine Hives and Rash    All over    . Hydroxychloroquine Sulfate Hives and Rash  . Amoxicillin-Pot Clavulanate Nausea And Vomiting    Social History   Socioeconomic History  . Marital status: Married    Spouse name: Not on file  . Number of children: Not on file  . Years of education: Not on file  . Highest education level: Not on file  Occupational History  . Not on file  Tobacco Use  . Smoking status: Never Smoker  . Smokeless tobacco: Never Used  Vaping Use  . Vaping Use: Never used  Substance and Sexual Activity  . Alcohol use: No  . Drug use: No  . Sexual activity: Yes  Other Topics Concern  . Not on file  Social History Narrative  . Not on file   Social Determinants of Health   Financial Resource Strain: Not on file  Food Insecurity: Not on file  Transportation Needs: Not on file  Physical Activity: Not on file  Stress: Not on file  Social Connections: Not on file  Intimate Partner Violence: Not on file    Tobacco Use: Low Risk   . Smoking Tobacco Use: Never Smoker  . Smokeless Tobacco Use: Never Used   Social History   Substance and Sexual Activity  Alcohol Use No    Family History  Problem Relation Age of Onset  . Dementia Mother   . Diabetes Father   . Cancer Father   . Heart Problems Father   . Cancer Brother        prostate  . Heart Problems Brother     Review of Systems  Constitutional: Negative for chills and fever.  HENT: Negative for congestion, sore throat and tinnitus.   Eyes: Negative for double vision, photophobia and pain.  Respiratory: Negative for cough, shortness of breath and wheezing.   Cardiovascular: Negative for chest pain, palpitations and orthopnea.  Gastrointestinal: Negative for heartburn, nausea and vomiting.  Genitourinary: Negative for dysuria, frequency and urgency.   Musculoskeletal: Positive for joint pain.  Neurological: Negative for dizziness, weakness and headaches.    Objective:  Physical Exam: Well nourished and well developed.  General: Alert and oriented x3, cooperative and pleasant, no acute distress.  Head: normocephalic, atraumatic, neck supple.  Eyes: EOMI.  Respiratory: breath sounds clear in all fields, no wheezing, rales, or rhonchi. Cardiovascular: Regular rate and rhythm, no murmurs, gallops or rubs.  Abdomen: non-tender to palpation and soft, normoactive bowel sounds. Musculoskeletal:  Right Knee Exam:  Mild tenderness to palpation of the lateral joint line.  AROM 0-130 degrees  No effusion or swelling  Mild instability with varus stressing  Moderate crepitus  Significant valgus deformity.  Calves soft and nontender. Motor function intact in LE. Strength 5/5 LE bilaterally. Neuro: Distal pulses 2+. Sensation to light touch intact in LE.  Imaging Review Plain radiographs demonstrate severe degenerative joint disease of the right knee. The overall alignment is mild valgus. The bone quality appears to be adequate for age and reported activity  level.  Assessment/Plan:  End stage arthritis, right knee   The patient history, physical examination, clinical judgment of the provider and imaging studies are consistent with end stage degenerative joint disease of the right knee and total knee arthroplasty is deemed medically necessary. The treatment options including medical management, injection therapy arthroscopy and arthroplasty were discussed at length. The risks and benefits of total knee arthroplasty were presented and reviewed. The risks due to aseptic loosening, infection, stiffness, patella tracking problems, thromboembolic complications and other imponderables were discussed. The patient acknowledged the explanation, agreed to proceed with the plan and consent was signed. Patient is being admitted for inpatient treatment for  surgery, pain control, PT, OT, prophylactic antibiotics, VTE prophylaxis, progressive ambulation and ADLs and discharge planning. The patient is planning to be discharged home.   Patient's anticipated LOS is less than 2 midnights, meeting these requirements: - Younger than 68 - Lives within 1 hour of care - Has a competent adult at home to recover with post-op recover - NO history of  - Chronic pain requiring opiods  - Diabetes  - Coronary Artery Disease  - Heart failure  - Heart attack  - Stroke  - DVT/VTE  - Cardiac arrhythmia  - Respiratory Failure/COPD  - Renal failure  - Anemia  - Advanced Liver disease  Therapy Plans: Outpatient therapy at [facility] Disposition: Home with husband Planned DVT Prophylaxis: Aspirin 325 mg BID DME Needed: None PCP: Melissa Montane, MD (clearance received) TXA: IV Allergies: Plaquenil (hives) Anesthesia Concerns: None BMI: 27.8 Last HgbA1c: 6.9% Pharmacy: [facility] [address])  Other:  - Takes Norco 5-325 mg TID as needed for knee pain - Oxycodone, tramadol with left TKA  - Patient was instructed on what medications to stop prior to surgery. - Follow-up visit in 2 weeks with Dr. Wynelle Link - Begin physical therapy following surgery - Pre-operative lab work as pre-surgical testing - Prescriptions will be provided in hospital at time of discharge  Theresa Duty, PA-C Orthopedic Surgery [facility] [place]

## 2020-05-16 NOTE — Patient Instructions (Addendum)
DUE TO COVID-19 ONLY ONE VISITOR IS ALLOWED TO COME WITH YOU AND STAY IN THE WAITING ROOM ONLY DURING PRE OP AND PROCEDURE DAY OF SURGERY. THE 1 VISITOR  MAY VISIT WITH YOU AFTER SURGERY IN YOUR PRIVATE ROOM DURING VISITING HOURS ONLY!  YOU NEED TO HAVE A COVID 19 TEST ON: 05/23/20 @ 1:00 PM , THIS TEST MUST BE DONE BEFORE SURGERY,  COVID TESTING SITE [place] [id number], IT IS ON THE RIGHT GOING OUT [address] APPROXIMATELY  2 MINUTES PAST [employer] ON THE RIGHT. ONCE YOUR COVID TEST IS COMPLETED,  PLEASE BEGIN THE QUARANTINE INSTRUCTIONS AS OUTLINED IN YOUR HANDOUT.                Ashley Perkins    Your procedure is scheduled on: 05/27/20   Report to [facility] Main  Entrance   Report to admitting at: 8:00 AM     Call this number if you have problems the morning of surgery [phone]    Remember:  NO SOLID FOOD AFTER MIDNIGHT THE NIGHT PRIOR TO SURGERY. NOTHING BY MOUTH EXCEPT CLEAR LIQUIDS UNTIL: 7:30 AM   BRUSH YOUR TEETH MORNING OF SURGERY AND RINSE YOUR MOUTH OUT, NO CHEWING GUM CANDY OR MINTS.    Take these medicines the morning of surgery with A SIP OF WATER: gabapentin,synthroid.  How to Manage Your Diabetes Before and After Surgery  Why is it important to control my blood sugar before and after surgery? . Improving blood sugar levels before and after surgery helps healing and can limit problems. . A way of improving blood sugar control is eating a healthy diet by: o  Eating less sugar and carbohydrates o  Increasing activity/exercise o  Talking with your doctor about reaching your blood sugar goals . High blood sugars (greater than 180 mg/dL) can raise your risk of infections and slow your recovery, so you will need to focus on controlling your diabetes during the weeks before surgery. . Make sure that the doctor who takes care of your diabetes knows about your planned surgery including the date and location.  How do I  manage my blood sugar before surgery? . Check your blood sugar at least 4 times a day, starting 2 days before surgery, to make sure that the level is not too high or low. o Check your blood sugar the morning of your surgery when you wake up and every 2 hours until you get to the Short Stay unit. . If your blood sugar is less than 70 mg/dL, you will need to treat for low blood sugar: o Do not take insulin. o Treat a low blood sugar (less than 70 mg/dL) with  cup of clear juice (cranberry or apple), 4 glucose tablets, OR glucose gel. o Recheck blood sugar in 15 minutes after treatment (to make sure it is greater than 70 mg/dL). If your blood sugar is not greater than 70 mg/dL on recheck, call [phone] for further instructions. . Report your blood sugar to the short stay nurse when you get to Short Stay.  . If you are admitted to the hospital after surgery: o Your blood sugar will be checked by the staff and you will probably be given insulin after surgery (instead of oral diabetes medicines) to make sure you have good blood sugar levels. o The goal for blood sugar control after surgery is 80-180 mg/dL.   WHAT DO I DO ABOUT MY DIABETES MEDICATION?  Marland Kitchen Do  not take oral diabetes medicines (pills) the morning of surgery.  . THE DAY BEFORE SURGERY, take Metformin and pioglitazone as usual.Take ONLY morning dose of Amaryl.       . THE MORNING OF SURGERY, DO NOT TAKE ANY DIABETIC MEDICATIONS DAY OF YOUR SURGERY                               You may not have any metal on your body including hair pins and              piercings  Do not wear jewelry, make-up, lotions, powders or perfumes, deodorant             Do not wear nail polish on your fingernails.  Do not shave  48 hours prior to surgery.    Do not bring valuables to the hospital. [place].  Contacts, dentures or bridgework may not be worn into surgery.  Leave suitcase in the car. After  surgery it may be brought to your room.     Patients discharged the day of surgery will not be allowed to drive home. IF YOU ARE HAVING SURGERY AND GOING HOME THE SAME DAY, YOU MUST HAVE AN ADULT TO DRIVE YOU HOME AND BE WITH YOU FOR 24 HOURS. YOU MAY GO HOME BY TAXI OR UBER OR ORTHERWISE, BUT AN ADULT MUST ACCOMPANY YOU HOME AND STAY WITH YOU FOR 24 HOURS.  Name and phone number of your driver:  Special Instructions: N/A              Please read over the following fact sheets you were given: _____________________________________________________________________         [facility] - Preparing for Surgery Before surgery, you can play an important role.  Because skin is not sterile, your skin needs to be as free of germs as possible.  You can reduce the number of germs on your skin by washing with CHG (chlorahexidine gluconate) soap before surgery.  CHG is an antiseptic cleaner which kills germs and bonds with the skin to continue killing germs even after washing. Please DO NOT use if you have an allergy to CHG or antibacterial soaps.  If your skin becomes reddened/irritated stop using the CHG and inform your nurse when you arrive at Short Stay. Do not shave (including legs and underarms) for at least 48 hours prior to the first CHG shower.  You may shave your face/neck. Please follow these instructions carefully:  1.  Shower with CHG Soap the night before surgery and the  morning of Surgery.  2.  If you choose to wash your hair, wash your hair first as usual with your  normal  shampoo.  3.  After you shampoo, rinse your hair and body thoroughly to remove the  shampoo.                           4.  Use CHG as you would any other liquid soap.  You can apply chg directly  to the skin and wash                       Gently with a scrungie or clean washcloth.  5.  Apply the CHG Soap to your body ONLY FROM THE NECK DOWN.  Do not use on face/ open                           Wound or open sores. Avoid  contact with eyes, ears mouth and genitals (private parts).                       Wash face,  Genitals (private parts) with your normal soap.             6.  Wash thoroughly, paying special attention to the area where your surgery  will be performed.  7.  Thoroughly rinse your body with warm water from the neck down.  8.  DO NOT shower/wash with your normal soap after using and rinsing off  the CHG Soap.                9.  Pat yourself dry with a clean towel.            10.  Wear clean pajamas.            11.  Place clean sheets on your bed the night of your first shower and do not  sleep with pets. Day of Surgery : Do not apply any lotions/deodorants the morning of surgery.  Please wear clean clothes to the hospital/surgery center.  FAILURE TO FOLLOW THESE INSTRUCTIONS MAY RESULT IN THE CANCELLATION OF YOUR SURGERY PATIENT SIGNATURE_________________________________  NURSE SIGNATURE__________________________________  ________________________________________________________________________   Ashley Perkins  An incentive spirometer is a tool that can help keep your lungs clear and active. This tool measures how well you are filling your lungs with each breath. Taking long deep breaths may help reverse or decrease the chance of developing breathing (pulmonary) problems (especially infection) following:  A long period of time when you are unable to move or be active. BEFORE THE PROCEDURE   If the spirometer includes an indicator to show your best effort, your nurse or respiratory therapist will set it to a desired goal.  If possible, sit up straight or lean slightly forward. Try not to slouch.  Hold the incentive spirometer in an upright position. INSTRUCTIONS FOR USE  1. Sit on the edge of your bed if possible, or sit up as far as you can in bed or on a chair. 2. Hold the incentive spirometer in an upright position. 3. Breathe out normally. 4. Place the mouthpiece in your mouth  and seal your lips tightly around it. 5. Breathe in slowly and as deeply as possible, raising the piston or the ball toward the top of the column. 6. Hold your breath for 3-5 seconds or for as long as possible. Allow the piston or ball to fall to the bottom of the column. 7. Remove the mouthpiece from your mouth and breathe out normally. 8. Rest for a few seconds and repeat Steps 1 through 7 at least 10 times every 1-2 hours when you are awake. Take your time and take a few normal breaths between deep breaths. 9. The spirometer may include an indicator to show your best effort. Use the indicator as a goal to work toward during each repetition. 10. After each set of 10 deep breaths, practice coughing to be sure your lungs are clear. If you have an incision (the cut made at the time of surgery), support your incision when coughing by placing a pillow or rolled up towels firmly against it. Once you are able to get out  of bed, walk around indoors and cough well. You may stop using the incentive spirometer when instructed by your caregiver.  RISKS AND COMPLICATIONS  Take your time so you do not get dizzy or light-headed.  If you are in pain, you may need to take or ask for pain medication before doing incentive spirometry. It is harder to take a deep breath if you are having pain. AFTER USE  Rest and breathe slowly and easily.  It can be helpful to keep track of a log of your progress. Your caregiver can provide you with a simple table to help with this. If you are using the spirometer at home, follow these instructions: Perkins IF:   You are having difficultly using the spirometer.  You have trouble using the spirometer as often as instructed.  Your pain medication is not giving enough relief while using the spirometer.  You develop fever of 100.5 F (38.1 C) or higher. SEEK IMMEDIATE MEDICAL CARE IF:   You cough up bloody sputum that had not been present before.  You develop  fever of 102 F (38.9 C) or greater.  You develop worsening pain at or near the incision site. MAKE SURE YOU:   Understand these instructions.  Will watch your condition.  Will get help right away if you are not doing well or get worse. Document Released: 07/13/2006 Document Revised: 05/25/2011 Document Reviewed: 09/13/2006 [facility] Patient Information 2014 [place], [place].   ________________________________________________________________________

## 2020-05-17 ENCOUNTER — Encounter (HOSPITAL_COMMUNITY): Payer: Self-pay

## 2020-05-17 ENCOUNTER — Other Ambulatory Visit: Payer: Self-pay

## 2020-05-17 ENCOUNTER — Encounter (HOSPITAL_COMMUNITY)
Admission: RE | Admit: 2020-05-17 | Discharge: 2020-05-17 | Disposition: A | Discharge status: Home / Self Care | Payer: Medicare Other | Source: Ambulatory Visit | Attending: Orthopedic Surgery | Admitting: Orthopedic Surgery

## 2020-05-17 DIAGNOSIS — Z01812 Encounter for preprocedural laboratory examination: Secondary | ICD-10-CM | POA: Diagnosis present

## 2020-05-17 HISTORY — DX: Malignant (primary) neoplasm, unspecified: C80.1

## 2020-05-17 LAB — COMPREHENSIVE METABOLIC PANEL
ALT: 16 U/L (ref 0–44)
AST: 23 U/L (ref 15–41)
Albumin: 3.7 g/dL (ref 3.5–5.0)
Alkaline Phosphatase: 70 U/L (ref 38–126)
Anion gap: 9 (ref 5–15)
BUN: 14 mg/dL (ref 8–23)
CO2: 27 mmol/L (ref 22–32)
Calcium: 8.9 mg/dL (ref 8.9–10.3)
Chloride: 99 mmol/L (ref 98–111)
Creatinine, Ser: 0.81 mg/dL (ref 0.44–1.00)
GFR, Estimated: >60 mL/min (ref >60)
Glucose, Bld: 146 mg/dL — ABNORMAL HIGH (ref 70–99)
Potassium: 4.3 mmol/L (ref 3.5–5.1)
Sodium: 135 mmol/L (ref 135–145)
Total Bilirubin: 0.9 mg/dL (ref 0.3–1.2)
Total Protein: 6.4 g/dL — ABNORMAL LOW (ref 6.5–8.1)

## 2020-05-17 LAB — CBC
HCT: 39.6 % (ref 36.0–46.0)
Hemoglobin: 12.9 g/dL (ref 12.0–15.0)
MCH: 30.3 pg (ref 26.0–34.0)
MCHC: 32.6 g/dL (ref 30.0–36.0)
MCV: 93 fL (ref 80.0–100.0)
Platelets: 213 10*3/uL (ref 150–400)
RBC: 4.26 MIL/uL (ref 3.87–5.11)
RDW: 14 % (ref 11.5–15.5)
WBC: 5.5 10*3/uL (ref 4.0–10.5)
nRBC: 0 % (ref 0.0–0.2)

## 2020-05-17 LAB — PROTIME-INR
INR: 1 (ref 0.8–1.2)
Prothrombin Time: 13.2 seconds (ref 11.4–15.2)

## 2020-05-17 LAB — APTT: aPTT: 33 seconds (ref 24–36)

## 2020-05-17 LAB — HEMOGLOBIN A1C
Hgb A1c MFr Bld: 7.1 % — ABNORMAL HIGH (ref 4.8–5.6)
Mean Plasma Glucose: 157.07 mg/dL

## 2020-05-17 LAB — SURGICAL PCR SCREEN
MRSA, PCR: NEGATIVE
Staphylococcus aureus: NEGATIVE

## 2020-05-17 LAB — GLUCOSE, CAPILLARY: Glucose-Capillary: 138 mg/dL — ABNORMAL HIGH (ref 70–99)

## 2020-05-17 NOTE — Progress Notes (Addendum)
COVID Vaccine Completed: Yes Date COVID Vaccine completed: 04/2019 COVID vaccine manufacturer:  Moderna     PCP - Tobie Lords: FNP. Clearance: Dr. Melissa Montane: 05/14/20: Chart Cardiologist -   Chest x-ray - 09/28/19 EKG - 09/25/19 Stress Test -  ECHO -  Cardiac Cath -  Pacemaker/ICD device last checked:  Sleep Study -  CPAP -   Fasting Blood Sugar - 160's Checks Blood Sugar __3___ times a day  Blood Thinner Instructions: Aspirin Instructions: Last Dose:  Anesthesia review:   Patient denies shortness of breath, fever, cough and chest pain at PAT appointment   Patient verbalized understanding of instructions that were given to them at the PAT appointment. Patient was also instructed that they will need to review over the PAT instructions again at home before surgery.

## 2020-05-23 ENCOUNTER — Other Ambulatory Visit (HOSPITAL_COMMUNITY)
Admission: RE | Admit: 2020-05-23 | Discharge: 2020-05-23 | Disposition: A | Discharge status: Home / Self Care | Payer: Medicare Other | Source: Ambulatory Visit | Attending: Orthopedic Surgery | Admitting: Orthopedic Surgery

## 2020-05-23 DIAGNOSIS — Z01812 Encounter for preprocedural laboratory examination: Secondary | ICD-10-CM | POA: Diagnosis present

## 2020-05-23 DIAGNOSIS — Z20822 Contact with and (suspected) exposure to covid-19: Secondary | ICD-10-CM | POA: Diagnosis not present

## 2020-05-23 LAB — SARS CORONAVIRUS 2 (TAT 6-24 HRS): SARS Coronavirus 2: NEGATIVE

## 2020-05-26 MED ORDER — BUPIVACAINE LIPOSOME 1.3 % IJ SUSP
20.0000 mL | Freq: Once | INTRAMUSCULAR | Status: AC
  Filled 2020-05-26: qty 20

## 2020-05-27 ENCOUNTER — Encounter (HOSPITAL_COMMUNITY)
Admission: RE | Disposition: A | Discharge status: Home / Self Care | Payer: Self-pay | Source: Home / Self Care | Attending: Orthopedic Surgery

## 2020-05-27 ENCOUNTER — Observation Stay (HOSPITAL_COMMUNITY)
Admission: RE | Admit: 2020-05-27 | Discharge: 2020-05-28 | Disposition: A | Discharge status: Home / Self Care | Payer: Medicare Other | Attending: Orthopedic Surgery | Admitting: Orthopedic Surgery

## 2020-05-27 ENCOUNTER — Ambulatory Visit (HOSPITAL_COMMUNITY): Payer: Medicare Other | Admitting: Anesthesiology

## 2020-05-27 ENCOUNTER — Other Ambulatory Visit: Payer: Self-pay

## 2020-05-27 ENCOUNTER — Encounter (HOSPITAL_COMMUNITY): Payer: Self-pay | Admitting: Orthopedic Surgery

## 2020-05-27 DIAGNOSIS — E119 Type 2 diabetes mellitus without complications: Secondary | ICD-10-CM | POA: Diagnosis not present

## 2020-05-27 DIAGNOSIS — Z85828 Personal history of other malignant neoplasm of skin: Secondary | ICD-10-CM | POA: Insufficient documentation

## 2020-05-27 DIAGNOSIS — M1711 Unilateral primary osteoarthritis, right knee: Principal | ICD-10-CM | POA: Diagnosis present

## 2020-05-27 DIAGNOSIS — Z79899 Other long term (current) drug therapy: Secondary | ICD-10-CM | POA: Insufficient documentation

## 2020-05-27 DIAGNOSIS — E039 Hypothyroidism, unspecified: Secondary | ICD-10-CM | POA: Diagnosis not present

## 2020-05-27 DIAGNOSIS — Z7984 Long term (current) use of oral hypoglycemic drugs: Secondary | ICD-10-CM | POA: Diagnosis not present

## 2020-05-27 DIAGNOSIS — M25561 Pain in right knee: Secondary | ICD-10-CM | POA: Diagnosis present

## 2020-05-27 HISTORY — PX: TOTAL KNEE ARTHROPLASTY: SHX125

## 2020-05-27 LAB — TYPE AND SCREEN
ABO/RH(D): O POS
Antibody Screen: NEGATIVE

## 2020-05-27 LAB — GLUCOSE, CAPILLARY
Glucose-Capillary: 138 mg/dL — ABNORMAL HIGH (ref 70–99)
Glucose-Capillary: 161 mg/dL — ABNORMAL HIGH (ref 70–99)
Glucose-Capillary: 220 mg/dL — ABNORMAL HIGH (ref 70–99)
Glucose-Capillary: 262 mg/dL — ABNORMAL HIGH (ref 70–99)

## 2020-05-27 SURGERY — ARTHROPLASTY, KNEE, TOTAL
Anesthesia: Monitor Anesthesia Care | Site: Knee | Laterality: Right

## 2020-05-27 MED ORDER — ONDANSETRON HCL 4 MG PO TABS: 4.0000 mg | ORAL_TABLET | Freq: Four times a day (QID) | ORAL | Status: DC | PRN

## 2020-05-27 MED ORDER — DIPHENHYDRAMINE HCL 12.5 MG/5ML PO ELIX
12.5000 mg | ORAL_SOLUTION | ORAL | Status: DC | PRN
Start: 2020-05-27 — End: 2020-05-28

## 2020-05-27 MED ORDER — MIDAZOLAM HCL 2 MG/2ML IJ SOLN
1.0000 mg | INTRAMUSCULAR | Status: DC
  Administered 2020-05-27: 1 mg via INTRAVENOUS
  Filled 2020-05-27: qty 2

## 2020-05-27 MED ORDER — CHLORHEXIDINE GLUCONATE 0.12 % MT SOLN
15.0000 mL | Freq: Once | OROMUCOSAL | Status: AC
  Administered 2020-05-27: 15 mL via OROMUCOSAL

## 2020-05-27 MED ORDER — DOCUSATE SODIUM 100 MG PO CAPS
100.0000 mg | ORAL_CAPSULE | Freq: Two times a day (BID) | ORAL | Status: DC
  Administered 2020-05-27 – 2020-05-28 (×2): 100 mg via ORAL
  Filled 2020-05-27 (×2): qty 1

## 2020-05-27 MED ORDER — ACETAMINOPHEN 10 MG/ML IV SOLN
1000.0000 mg | Freq: Four times a day (QID) | INTRAVENOUS | Status: DC
  Administered 2020-05-27: 1000 mg via INTRAVENOUS
  Filled 2020-05-27: qty 100

## 2020-05-27 MED ORDER — ASPIRIN EC 325 MG PO TBEC
325.0000 mg | DELAYED_RELEASE_TABLET | Freq: Two times a day (BID) | ORAL | Status: DC
  Administered 2020-05-28: 325 mg via ORAL
  Filled 2020-05-27: qty 1

## 2020-05-27 MED ORDER — OXYCODONE HCL 5 MG PO TABS
5.0000 mg | ORAL_TABLET | Freq: Once | ORAL | Status: DC | PRN
Start: 2020-05-27 — End: 2020-05-27

## 2020-05-27 MED ORDER — DEXAMETHASONE SODIUM PHOSPHATE 10 MG/ML IJ SOLN
8.0000 mg | Freq: Once | INTRAMUSCULAR | Status: AC
  Administered 2020-05-27: 10 mg via INTRAVENOUS

## 2020-05-27 MED ORDER — SODIUM CHLORIDE 0.9 % IV SOLN: INTRAVENOUS | Status: DC

## 2020-05-27 MED ORDER — PROPOFOL 500 MG/50ML IV EMUL
INTRAVENOUS | Status: AC
  Filled 2020-05-27: qty 100

## 2020-05-27 MED ORDER — SODIUM CHLORIDE (PF) 0.9 % IJ SOLN
INTRAMUSCULAR | Status: DC | PRN
  Administered 2020-05-27: 60 mL

## 2020-05-27 MED ORDER — ROPIVACAINE HCL 5 MG/ML IJ SOLN
INTRAMUSCULAR | Status: DC | PRN
  Administered 2020-05-27: 20 mL via PERINEURAL

## 2020-05-27 MED ORDER — PROPOFOL 10 MG/ML IV BOLUS
INTRAVENOUS | Status: AC
  Filled 2020-05-27: qty 20

## 2020-05-27 MED ORDER — FLEET ENEMA 7-19 GM/118ML RE ENEM: 1.0000 | ENEMA | Freq: Once | RECTAL | Status: DC | PRN

## 2020-05-27 MED ORDER — PROMETHAZINE HCL 25 MG/ML IJ SOLN: 6.2500 mg | INTRAMUSCULAR | Status: DC | PRN

## 2020-05-27 MED ORDER — ONDANSETRON HCL 4 MG/2ML IJ SOLN: 4.0000 mg | Freq: Four times a day (QID) | INTRAMUSCULAR | Status: DC | PRN

## 2020-05-27 MED ORDER — METOCLOPRAMIDE HCL 5 MG PO TABS
5.0000 mg | ORAL_TABLET | Freq: Three times a day (TID) | ORAL | Status: DC | PRN
Start: 2020-05-27 — End: 2020-05-28

## 2020-05-27 MED ORDER — DEXAMETHASONE SODIUM PHOSPHATE 10 MG/ML IJ SOLN
10.0000 mg | Freq: Once | INTRAMUSCULAR | Status: AC
  Administered 2020-05-28: 10 mg via INTRAVENOUS
  Filled 2020-05-27: qty 1

## 2020-05-27 MED ORDER — GABAPENTIN 300 MG PO CAPS
300.0000 mg | ORAL_CAPSULE | Freq: Four times a day (QID) | ORAL | Status: DC
  Administered 2020-05-27 – 2020-05-28 (×4): 300 mg via ORAL
  Filled 2020-05-27 (×4): qty 1

## 2020-05-27 MED ORDER — OXYCODONE HCL 5 MG/5ML PO SOLN
5.0000 mg | Freq: Once | ORAL | Status: DC | PRN
Start: 2020-05-27 — End: 2020-05-27

## 2020-05-27 MED ORDER — METHOCARBAMOL 500 MG PO TABS
500.0000 mg | ORAL_TABLET | Freq: Four times a day (QID) | ORAL | Status: DC | PRN
  Administered 2020-05-27 – 2020-05-28 (×3): 500 mg via ORAL
  Filled 2020-05-27 (×3): qty 1

## 2020-05-27 MED ORDER — ATORVASTATIN CALCIUM 10 MG PO TABS
10.0000 mg | ORAL_TABLET | Freq: Every day | ORAL | Status: DC
  Administered 2020-05-27: 10 mg via ORAL
  Filled 2020-05-27: qty 1

## 2020-05-27 MED ORDER — PIOGLITAZONE HCL 15 MG PO TABS
15.0000 mg | ORAL_TABLET | Freq: Every day | ORAL | Status: DC
  Administered 2020-05-28: 15 mg via ORAL
  Filled 2020-05-27: qty 1

## 2020-05-27 MED ORDER — BUPIVACAINE LIPOSOME 1.3 % IJ SUSP
INTRAMUSCULAR | Status: DC | PRN
  Administered 2020-05-27: 20 mL

## 2020-05-27 MED ORDER — INSULIN ASPART 100 UNIT/ML ~~LOC~~ SOLN
0.0000 [IU] | Freq: Every day | SUBCUTANEOUS | Status: DC
  Administered 2020-05-27: 3 [IU] via SUBCUTANEOUS

## 2020-05-27 MED ORDER — LEVOTHYROXINE SODIUM 88 MCG PO TABS
88.0000 ug | ORAL_TABLET | Freq: Every day | ORAL | Status: DC
  Administered 2020-05-28: 88 ug via ORAL
  Filled 2020-05-27: qty 1

## 2020-05-27 MED ORDER — ORAL CARE MOUTH RINSE: 15.0000 mL | Freq: Once | OROMUCOSAL | Status: AC

## 2020-05-27 MED ORDER — STERILE WATER FOR IRRIGATION IR SOLN
Status: DC | PRN
  Administered 2020-05-27: 2000 mL

## 2020-05-27 MED ORDER — GLIMEPIRIDE 2 MG PO TABS
2.0000 mg | ORAL_TABLET | Freq: Every day | ORAL | Status: DC
  Administered 2020-05-28: 2 mg via ORAL
  Filled 2020-05-27: qty 1

## 2020-05-27 MED ORDER — CEFAZOLIN SODIUM-DEXTROSE 2-4 GM/100ML-% IV SOLN
2.0000 g | Freq: Four times a day (QID) | INTRAVENOUS | Status: AC
  Administered 2020-05-27 (×2): 2 g via INTRAVENOUS
  Filled 2020-05-27 (×2): qty 100

## 2020-05-27 MED ORDER — ACETAMINOPHEN 325 MG PO TABS
325.0000 mg | ORAL_TABLET | Freq: Four times a day (QID) | ORAL | Status: DC | PRN
Start: 2020-05-27 — End: 2020-05-28

## 2020-05-27 MED ORDER — FENTANYL CITRATE (PF) 100 MCG/2ML IJ SOLN
50.0000 ug | Freq: Once | INTRAMUSCULAR | Status: AC
  Administered 2020-05-27: 50 ug via INTRAVENOUS
  Filled 2020-05-27: qty 2

## 2020-05-27 MED ORDER — TRAMADOL HCL 50 MG PO TABS
50.0000 mg | ORAL_TABLET | Freq: Four times a day (QID) | ORAL | Status: DC | PRN
Start: 2020-05-27 — End: 2020-05-28
  Administered 2020-05-28: 100 mg via ORAL
  Filled 2020-05-27: qty 2

## 2020-05-27 MED ORDER — AMITRIPTYLINE HCL 50 MG PO TABS
75.0000 mg | ORAL_TABLET | Freq: Every day | ORAL | Status: DC
  Administered 2020-05-27: 75 mg via ORAL
  Filled 2020-05-27: qty 1

## 2020-05-27 MED ORDER — BISACODYL 10 MG RE SUPP: 10.0000 mg | Freq: Every day | RECTAL | Status: DC | PRN

## 2020-05-27 MED ORDER — POLYETHYLENE GLYCOL 3350 17 G PO PACK: 17.0000 g | PACK | Freq: Every day | ORAL | Status: DC | PRN

## 2020-05-27 MED ORDER — BUPIVACAINE HCL (PF) 0.75 % IJ SOLN
INTRAMUSCULAR | Status: DC | PRN
  Administered 2020-05-27: 1.8 mL via INTRATHECAL

## 2020-05-27 MED ORDER — PROPOFOL 500 MG/50ML IV EMUL
INTRAVENOUS | Status: DC | PRN
  Administered 2020-05-27: 75 ug/kg/min via INTRAVENOUS

## 2020-05-27 MED ORDER — PHENOL 1.4 % MT LIQD: 1.0000 | OROMUCOSAL | Status: DC | PRN

## 2020-05-27 MED ORDER — CEFAZOLIN SODIUM-DEXTROSE 2-4 GM/100ML-% IV SOLN
2.0000 g | INTRAVENOUS | Status: AC
  Administered 2020-05-27: 2 g via INTRAVENOUS
  Filled 2020-05-27: qty 100

## 2020-05-27 MED ORDER — POVIDONE-IODINE 10 % EX SWAB
2.0000 "application " | Freq: Once | CUTANEOUS | Status: AC
  Administered 2020-05-27: 2 via TOPICAL

## 2020-05-27 MED ORDER — LACTATED RINGERS IV SOLN: INTRAVENOUS | Status: DC

## 2020-05-27 MED ORDER — MORPHINE SULFATE (PF) 2 MG/ML IV SOLN
0.5000 mg | INTRAVENOUS | Status: DC | PRN
  Administered 2020-05-27: 0.5 mg via INTRAVENOUS
  Filled 2020-05-27: qty 1

## 2020-05-27 MED ORDER — HYDROMORPHONE HCL 1 MG/ML IJ SOLN: 0.2500 mg | INTRAMUSCULAR | Status: DC | PRN

## 2020-05-27 MED ORDER — METHOCARBAMOL 1000 MG/10ML IJ SOLN
500.0000 mg | Freq: Four times a day (QID) | INTRAVENOUS | Status: DC | PRN
  Filled 2020-05-27: qty 5

## 2020-05-27 MED ORDER — SODIUM CHLORIDE 0.9 % IR SOLN
Status: DC | PRN
  Administered 2020-05-27: 1000 mL

## 2020-05-27 MED ORDER — OXYCODONE HCL 5 MG PO TABS
5.0000 mg | ORAL_TABLET | ORAL | Status: DC | PRN
  Administered 2020-05-27: 5 mg via ORAL
  Administered 2020-05-27: 10 mg via ORAL
  Administered 2020-05-27 – 2020-05-28 (×2): 5 mg via ORAL
  Administered 2020-05-28 (×2): 10 mg via ORAL
  Filled 2020-05-27 (×3): qty 2
  Filled 2020-05-27: qty 1
  Filled 2020-05-27: qty 2
  Filled 2020-05-27: qty 1

## 2020-05-27 MED ORDER — ONDANSETRON HCL 4 MG/2ML IJ SOLN
INTRAMUSCULAR | Status: DC | PRN
  Administered 2020-05-27: 4 mg via INTRAVENOUS

## 2020-05-27 MED ORDER — METOCLOPRAMIDE HCL 5 MG/ML IJ SOLN: 5.0000 mg | Freq: Three times a day (TID) | INTRAMUSCULAR | Status: DC | PRN

## 2020-05-27 MED ORDER — MENTHOL 3 MG MT LOZG: 1.0000 | LOZENGE | OROMUCOSAL | Status: DC | PRN

## 2020-05-27 MED ORDER — INSULIN ASPART 100 UNIT/ML ~~LOC~~ SOLN
0.0000 [IU] | Freq: Three times a day (TID) | SUBCUTANEOUS | Status: DC
  Administered 2020-05-27 – 2020-05-28 (×2): 5 [IU] via SUBCUTANEOUS
  Administered 2020-05-28: 2 [IU] via SUBCUTANEOUS

## 2020-05-27 MED ORDER — TRANEXAMIC ACID-NACL 1000-0.7 MG/100ML-% IV SOLN
1000.0000 mg | INTRAVENOUS | Status: AC
  Administered 2020-05-27: 1000 mg via INTRAVENOUS
  Filled 2020-05-27: qty 100

## 2020-05-27 SURGICAL SUPPLY — 56 items
ATTUNE PSFEM RTSZ6 NARCEM KNEE (Femur) ×2 IMPLANT
ATTUNE PSRP INSR SZ6 7 KNEE (Insert) ×2 IMPLANT
BAG ZIPLOCK 12X15 (MISCELLANEOUS) ×2 IMPLANT
BASE TIBIAL ROT PLAT SZ 5 KNEE (Knees) ×1 IMPLANT
BLADE SAG 18X100X1.27 (BLADE) ×2 IMPLANT
BLADE SAW SGTL 11.0X1.19X90.0M (BLADE) ×2 IMPLANT
BNDG ELASTIC 6X10 VLCR STRL LF (GAUZE/BANDAGES/DRESSINGS) ×2 IMPLANT
BNDG ELASTIC 6X5.8 VLCR STR LF (GAUZE/BANDAGES/DRESSINGS) ×2 IMPLANT
BOWL SMART MIX CTS (DISPOSABLE) ×2 IMPLANT
CEMENT HV SMART SET (Cement) ×4 IMPLANT
CLSR STERI-STRIP ANTIMIC 1/2X4 (GAUZE/BANDAGES/DRESSINGS) ×2 IMPLANT
COVER SURGICAL LIGHT HANDLE (MISCELLANEOUS) ×2 IMPLANT
COVER WAND RF STERILE (DRAPES) ×2 IMPLANT
CUFF TOURN SGL QUICK 34 (TOURNIQUET CUFF) ×2
CUFF TRNQT CYL 34X4.125X (TOURNIQUET CUFF) ×1 IMPLANT
DECANTER SPIKE VIAL GLASS SM (MISCELLANEOUS) ×2 IMPLANT
DRAPE U-SHAPE 47X51 STRL (DRAPES) ×2 IMPLANT
DRSG AQUACEL AG ADV 3.5X10 (GAUZE/BANDAGES/DRESSINGS) ×2 IMPLANT
DURAPREP 26ML APPLICATOR (WOUND CARE) ×2 IMPLANT
ELECT REM PT RETURN 15FT ADLT (MISCELLANEOUS) ×2 IMPLANT
GLOVE SRG 8 PF TXTR STRL LF DI (GLOVE) ×1 IMPLANT
GLOVE SURG ENC MOIS LTX SZ6 (GLOVE) IMPLANT
GLOVE SURG ENC MOIS LTX SZ7 (GLOVE) ×2 IMPLANT
GLOVE SURG ENC MOIS LTX SZ8 (GLOVE) ×2 IMPLANT
GLOVE SURG UNDER POLY LF SZ6.5 (GLOVE) ×2 IMPLANT
GLOVE SURG UNDER POLY LF SZ8 (GLOVE) ×2
GLOVE SURG UNDER POLY LF SZ8.5 (GLOVE) ×2 IMPLANT
GOWN STRL REUS W/TWL LRG LVL3 (GOWN DISPOSABLE) ×4 IMPLANT
GOWN STRL REUS W/TWL XL LVL3 (GOWN DISPOSABLE) ×2 IMPLANT
HANDPIECE INTERPULSE COAX TIP (DISPOSABLE) ×2
HOLDER FOLEY CATH W/STRAP (MISCELLANEOUS) ×2 IMPLANT
IMMOBILIZER KNEE 20 (SOFTGOODS) ×2
IMMOBILIZER KNEE 20 THIGH 36 (SOFTGOODS) ×1 IMPLANT
KIT TURNOVER KIT A (KITS) ×2 IMPLANT
MANIFOLD NEPTUNE II (INSTRUMENTS) ×2 IMPLANT
NS IRRIG 1000ML POUR BTL (IV SOLUTION) ×2 IMPLANT
PACK TOTAL KNEE CUSTOM (KITS) ×2 IMPLANT
PADDING CAST COTTON 6X4 STRL (CAST SUPPLIES) ×2 IMPLANT
PATELLA MEDIAL ATTUN 35MM KNEE (Knees) ×2 IMPLANT
PENCIL SMOKE EVACUATOR (MISCELLANEOUS) ×2 IMPLANT
PIN DRILL FIX HALF THREAD (BIT) ×2 IMPLANT
PIN STEINMAN FIXATION KNEE (PIN) ×2 IMPLANT
PROTECTOR NERVE ULNAR (MISCELLANEOUS) ×2 IMPLANT
SET HNDPC FAN SPRY TIP SCT (DISPOSABLE) ×1 IMPLANT
STRIP CLOSURE SKIN 1/2X4 (GAUZE/BANDAGES/DRESSINGS) ×4 IMPLANT
SUT MNCRL AB 4-0 PS2 18 (SUTURE) ×2 IMPLANT
SUT STRATAFIX 0 PDS 27 VIOLET (SUTURE) ×2
SUT VIC AB 2-0 CT1 27 (SUTURE) ×6
SUT VIC AB 2-0 CT1 TAPERPNT 27 (SUTURE) ×3 IMPLANT
SUTURE STRATFX 0 PDS 27 VIOLET (SUTURE) ×1 IMPLANT
TIBIAL BASE ROT PLAT SZ 5 KNEE (Knees) ×2 IMPLANT
TRAY FOLEY MTR SLVR 14FR STAT (SET/KITS/TRAYS/PACK) ×2 IMPLANT
TRAY FOLEY MTR SLVR 16FR STAT (SET/KITS/TRAYS/PACK) IMPLANT
WATER STERILE IRR 1000ML POUR (IV SOLUTION) ×4 IMPLANT
WRAP KNEE MAXI GEL POST OP (GAUZE/BANDAGES/DRESSINGS) ×2 IMPLANT
YANKAUER SUCT BULB TIP 10FT TU (MISCELLANEOUS) ×2 IMPLANT

## 2020-05-27 NOTE — Anesthesia Preprocedure Evaluation (Signed)
Anesthesia Evaluation  Patient identified by MRN, date of birth, ID band Patient awake    Reviewed: Allergy & Precautions, NPO status , Patient's Chart, lab work & pertinent test results  History of Anesthesia Complications (+) PONV and history of anesthetic complications (mild)  Airway Mallampati: II  TM Distance: >3 FB Neck ROM: Full    Dental no notable dental hx. (+) Dental Advisory Given,    Pulmonary neg pulmonary ROS,    Pulmonary exam normal breath sounds clear to auscultation       Cardiovascular negative cardio ROS Normal cardiovascular exam Rhythm:Regular Rate:Normal     Neuro/Psych PSYCHIATRIC DISORDERS Depression negative neurological ROS     GI/Hepatic negative GI ROS, Neg liver ROS,   Endo/Other  diabetes, Well Controlled, Type 2, Oral Hypoglycemic AgentsHypothyroidism   Renal/GU negative Renal ROS  negative genitourinary   Musculoskeletal  (+) Arthritis , Osteoarthritis,  Fibromyalgia -Hydrocodone for chronic LBP- rarely uses 1 back surgery in past   Abdominal   Peds  Hematology negative hematology ROS (+) hct 41, plt 241   Anesthesia Other Findings   Reproductive/Obstetrics negative OB ROS                             Anesthesia Physical  Anesthesia Plan  ASA: III  Anesthesia Plan: Spinal, Regional and MAC   Post-op Pain Management:  Regional for Post-op pain   Induction:   PONV Risk Score and Plan: 3 and Propofol infusion, TIVA, Ondansetron and Treatment may vary due to age or medical condition  Airway Management Planned: Natural Airway and Simple Face Mask  Additional Equipment: None  Intra-op Plan:   Post-operative Plan:   Informed Consent: I have reviewed the patients History and Physical, chart, labs and discussed the procedure including the risks, benefits and alternatives for the proposed anesthesia with the patient or authorized representative who  has indicated his/her understanding and acceptance.       Plan Discussed with: CRNA  Anesthesia Plan Comments:         Anesthesia Quick Evaluation

## 2020-05-27 NOTE — Transfer of Care (Signed)
Immediate Anesthesia Transfer of Care Note  Patient: Ashley Perkins  Procedure(s) Performed: TOTAL KNEE ARTHROPLASTY (Right Knee)  Patient Location: PACU  Anesthesia Type:Spinal  Level of Consciousness: sedated and patient cooperative  Airway & Oxygen Therapy: Patient Spontanous Breathing and Patient connected to face mask oxygen  Post-op Assessment: Report given to RN and Post -op Vital signs reviewed and stable  Post vital signs: Reviewed and stable  Last Vitals:  Vitals Value Taken Time  BP    Temp    Pulse    Resp    SpO2      Last Pain:  Vitals:   05/27/20 0841  TempSrc:   PainSc: 0-No pain      Patients Stated Pain Goal: 5 (72/76/18 4859)  Complications: No complications documented.

## 2020-05-27 NOTE — Anesthesia Procedure Notes (Signed)
Anesthesia Regional Block: Adductor canal block   Pre-Anesthetic Checklist: ,, timeout performed, Correct Patient, Correct Site, Correct Laterality, Correct Procedure, Correct Position, site marked, Risks and benefits discussed,  Surgical consent,  Pre-op evaluation,  At surgeon's request and post-op pain management  Laterality: Right  Prep: chloraprep       Needles:  Injection technique: Single-shot  Needle Type: Stimiplex     Needle Length: 9cm  Needle Gauge: 21     Additional Needles:   Procedures:,,,, ultrasound used (permanent image in chart),,,,  Narrative:  Start time: 05/27/2020 10:00 AM End time: 05/27/2020 10:05 AM Injection made incrementally with aspirations every 5 mL.  Performed by: Personally  Anesthesiologist: Lynda Rainwater, MD

## 2020-05-27 NOTE — Progress Notes (Signed)
Orthopedic Tech Progress Note Patient Details:  Ashley Perkins [birth year] [id number]  CPM Right Knee CPM Right Knee: Off Right Knee Flexion (Degrees): 40 Right Knee Extension (Degrees): 10  Post Interventions Patient Tolerated: Well Instructions Provided: Care of device  [place] Pink 05/27/2020, 4:01 PM

## 2020-05-27 NOTE — Anesthesia Procedure Notes (Signed)
Spinal  Start time: 05/27/2020 10:44 AM End time: 05/27/2020 10:47 AM Staffing Performed: resident/CRNA  Resident/CRNA: Gean Maidens, CRNA Preanesthetic Checklist Completed: patient identified, IV checked, site marked, risks and benefits discussed, surgical consent, monitors and equipment checked, pre-op evaluation and timeout performed Spinal Block Patient position: sitting Prep: DuraPrep Patient monitoring: heart rate, continuous pulse ox and blood pressure Approach: midline Location: L3-4 Injection technique: single-shot Needle Needle type: Pencan  Needle gauge: 24 G Needle length: 9 cm Needle insertion depth: 6 cm Additional Notes Pt sitting position, sterile prep and drape, negative paresthesia/heme

## 2020-05-27 NOTE — Op Note (Signed)
OPERATIVE REPORT-TOTAL KNEE ARTHROPLASTY   Pre-operative diagnosis- Osteoarthritis  Right knee(s)  Post-operative diagnosis- Osteoarthritis Right knee(s)  Procedure-  Right  Total Knee Arthroplasty  Surgeon- Dione Plover. Liboria Putnam, MD  Assistant- Theresa Duty, PA-C   Anesthesia-  Adductor canal block and spinal  EBL-25 mL   Drains None  Tourniquet time-  Total Tourniquet Time Documented: Thigh (Right) - 31 minutes Total: Thigh (Right) - 31 minutes     Complications- None  Condition-PACU - hemodynamically stable.   Brief Clinical Note  Ashley Perkins is a 80 y.o. year old female with end stage OA of her right knee with progressively worsening pain and dysfunction. She has constant pain, with activity and at rest and significant functional deficits with difficulties even with ADLs. She has had extensive non-op management including analgesics, injections of cortisone and viscosupplements, and home exercise program, but remains in significant pain with significant dysfunction.Radiographs show bone on bone arthritis lateral and patellofemoral. She presents now for right Total Knee Arthroplasty.    Procedure in detail---   The patient is brought into the operating room and positioned supine on the operating table. After successful administration of  Adductor canal block and spinal,   a tourniquet is placed high on the  Right thigh(s) and the lower extremity is prepped and draped in the usual sterile fashion. Time out is performed by the operating team and then the  Right lower extremity is wrapped in Esmarch, knee flexed and the tourniquet inflated to 300 mmHg.       A midline incision is made with a ten blade through the subcutaneous tissue to the level of the extensor mechanism. A fresh blade is used to make a medial parapatellar arthrotomy. Soft tissue over the proximal medial tibia is subperiosteally elevated to the joint line with a knife and into the semimembranosus bursa with a  Cobb elevator. Soft tissue over the proximal lateral tibia is elevated with attention being paid to avoiding the patellar tendon on the tibial tubercle. The patella is everted, knee flexed 90 degrees and the ACL and PCL are removed. Findings are bone on bone lateral and patellofemoral with large global osteophytes        The drill is used to create a starting hole in the distal femur and the canal is thoroughly irrigated with sterile saline to remove the fatty contents. The 5 degree Right  valgus alignment guide is placed into the femoral canal and the distal femoral cutting block is pinned to remove 9 mm off the distal femur. Resection is made with an oscillating saw.      The tibia is subluxed forward and the menisci are removed. The extramedullary alignment guide is placed referencing proximally at the medial aspect of the tibial tubercle and distally along the second metatarsal axis and tibial crest. The block is pinned to remove 1mm off the more deficient lateral  side. Resection is made with an oscillating saw. Size 5is the most appropriate size for the tibia and the proximal tibia is prepared with the modular drill and keel punch for that size.      The femoral sizing guide is placed and size 5 is most appropriate. Rotation is marked off the epicondylar axis and confirmed by creating a rectangular flexion gap at 90 degrees. The size 5 cutting block is pinned in this rotation and the anterior, posterior and chamfer cuts are made with the oscillating saw. The intercondylar block is then placed and that cut is made.  Trial size 5 tibial component, trial size 5 narrow posterior stabilized femur and a 7  mm posterior stabilized rotating platform insert trial is placed. Full extension is achieved with excellent varus/valgus and anterior/posterior balance throughout full range of motion. The patella is everted and thickness measured to be 22  mm. Free hand resection is taken to 12 mm, a 35 template is  placed, lug holes are drilled, trial patella is placed, and it tracks normally. Osteophytes are removed off the posterior femur with the trial in place. All trials are removed and the cut bone surfaces prepared with pulsatile lavage. Cement is mixed and once ready for implantation, the size 5 tibial implant, size  5 narrow posterior stabilized femoral component, and the size 35 patella are cemented in place and the patella is held with the clamp. The trial insert is placed and the knee held in full extension. The Exparel (20 ml mixed with 60 ml saline) is injected into the extensor mechanism, posterior capsule, medial and lateral gutters and subcutaneous tissues.  All extruded cement is removed and once the cement is hard the permanent 7 mm posterior stabilized rotating platform insert is placed into the tibial tray.      The wound is copiously irrigated with saline solution and the extensor mechanism closed with # 0 Stratofix suture. The tourniquet is released for a total tourniquet time of 31  minutes. Flexion against gravity is 140 degrees and the patella tracks normally. Subcutaneous tissue is closed with 2.0 vicryl and subcuticular with running 4.0 Monocryl. The incision is cleaned and dried and steri-strips and a bulky sterile dressing are applied. The limb is placed into a knee immobilizer and the patient is awakened and transported to recovery in stable condition.      Please note that a surgical assistant was a medical necessity for this procedure in order to perform it in a safe and expeditious manner. Surgical assistant was necessary to retract the ligaments and vital neurovascular structures to prevent injury to them and also necessary for proper positioning of the limb to allow for anatomic placement of the prosthesis.   Dione Plover Ashley Liberatore, MD    05/27/2020, 11:46 AM

## 2020-05-27 NOTE — Progress Notes (Signed)
AssistedDr. Miller with right, ultrasound guided, adductor canal block. Side rails up, monitors on throughout procedure. See vital signs in flow sheet. Tolerated Procedure well.  

## 2020-05-27 NOTE — Evaluation (Signed)
Physical Therapy Evaluation Patient Details Name: Ashley Perkins MRN: [id number] DOB: 08/25/1940 Today's Date: 05/27/2020   History of Present Illness  Pt is 80 yo female s/p R TKA on 05/27/20.  Pt with PMH of fibromyalgia, DM, arthritis, CA, Pt is 80 yo female s/p R TKA on 05/27/20.  Pt with PMH of fibromyalgia, DM, arthritis, CA, and L TKA.  Clinical Impression  Pt is s/p R TKA resulting in the deficits listed below (see PT Problem List). Pt making excellent progress for DOS with good pain control and quad activation.  Pt has necessary DME and support at home.  Pt very motivated and expected to progress very well with therapy. Pt will benefit from skilled PT to increase their independence and safety with mobility to allow discharge to the venue listed below.      Follow Up Recommendations Follow surgeon's recommendation for DC plan and follow-up therapies;Supervision for mobility/OOB    Equipment Recommendations  None recommended by PT    Recommendations for Other Services       Precautions / Restrictions Precautions Precautions: Fall Restrictions Weight Bearing Restrictions: Yes RLE Weight Bearing: Weight bearing as tolerated      Mobility  Bed Mobility Overal bed mobility: Needs Assistance Bed Mobility: Supine to Sit     Supine to sit: Supervision          Transfers Overall transfer level: Needs assistance Equipment used: Rolling walker (2 wheeled) Transfers: Sit to/from Stand Sit to Stand: Min guard         General transfer comment: min guard for safety with cues for hand placement  Ambulation/Gait Ambulation/Gait assistance: Min guard Gait Distance (Feet): 125 Feet Assistive device: Rolling walker (2 wheeled) Gait Pattern/deviations: Step-through pattern;Decreased stride length;Trunk flexed Gait velocity: decreased   General Gait Details: Cues for RW proximity, posture, and to look up.  Stairs            Wheelchair Mobility    Modified Rankin  (Stroke Patients Only)       Balance Overall balance assessment: Needs assistance Sitting-balance support: No upper extremity supported;Feet supported Sitting balance-Leahy Scale: Normal     Standing balance support: Bilateral upper extremity supported;No upper extremity supported Standing balance-Leahy Scale: Fair Standing balance comment: Static - no support; RW to ambulate                             Pertinent Vitals/Pain Pain Assessment: 0-10 Pain Score: 5  Pain Location: R knee Pain Descriptors / Indicators: Sore Pain Intervention(s): Limited activity within patient's tolerance;Monitored during session;Premedicated before session;Ice applied;Repositioned    Home Living Family/patient expects to be discharged to:: Private residence Living Arrangements: Spouse/significant other Available Help at Discharge: Family;Available 24 hours/day Type of Home: Other(Comment) (condo) Home Access: Stairs to enter Entrance Stairs-Rails: None Entrance Stairs-Number of Steps: 2 and landing then 1 step Home Layout: One level Home Equipment: Cane - single point;Tub bench;Toilet riser;Walker - 2 wheels;Bedside commode;Transport chair      Prior Function Level of Independence: Needs assistance   Gait / Transfers Assistance Needed: Able to ambulate in community without AD  ADL's / Homemaking Assistance Needed: Pt back to ADLs and IADLs, driving        Hand Dominance        Extremity/Trunk Assessment   Upper Extremity Assessment Upper Extremity Assessment: Overall WFL for tasks assessed    Lower Extremity Assessment Lower Extremity Assessment: LLE deficits/detail;RLE deficits/detail RLE Deficits / Details:  Presents with changes consistent with post op.  ROM: hip and ankle WFL, knee 0 to 80 degrees; MMT: anke 5/5, hip and knee 3/5 not further tested LLE Deficits / Details: WNL    Cervical / Trunk Assessment Cervical / Trunk Assessment: Normal  Communication       Cognition Arousal/Alertness: Awake/alert Behavior During Therapy: WFL for tasks assessed/performed Overall Cognitive Status: Within Functional Limits for tasks assessed                                        General Comments General comments (skin integrity, edema, etc.): VSS - left on RA with continuous pulse ox connected    Exercises Total Joint Exercises Ankle Circles/Pumps: AROM;10 reps;Both Quad Sets: AROM;Both;10 reps   Assessment/Plan    PT Assessment Patient needs continued PT services  PT Problem List Decreased strength;Decreased mobility;Decreased range of motion;Decreased activity tolerance;Decreased balance;Decreased knowledge of use of DME;Pain       PT Treatment Interventions DME instruction;Therapeutic activities;Gait training;Therapeutic exercise;Patient/family education;Modalities;Stair training;Balance training;Functional mobility training    PT Goals (Current goals can be found in the Care Plan section)  Acute Rehab PT Goals Patient Stated Goal: return home and to water aerobics PT Goal Formulation: With patient Time For Goal Achievement: 06/10/20 Potential to Achieve Goals: Good    Frequency 7X/week   Barriers to discharge        Co-evaluation               AM-PAC PT "6 Clicks" Mobility  Outcome Measure Help needed turning from your back to your side while in a flat bed without using bedrails?: A Little Help needed moving from lying on your back to sitting on the side of a flat bed without using bedrails?: A Little Help needed moving to and from a bed to a chair (including a wheelchair)?: A Little Help needed standing up from a chair using your arms (e.g., wheelchair or bedside chair)?: A Little Help needed to walk in hospital room?: A Little Help needed climbing 3-5 steps with a railing? : A Lot 6 Click Score: 17    End of Session Equipment Utilized During Treatment: Gait belt Activity Tolerance: Patient tolerated  treatment well Patient left: with chair alarm set;in chair;with call bell/phone within reach Nurse Communication: Mobility status PT Visit Diagnosis: Other abnormalities of gait and mobility (R26.89);Muscle weakness (generalized) (M62.81)    Time: 8546-2703 PT Time Calculation (min) (ACUTE ONLY): 30 min   Charges:   PT Evaluation $PT Eval Moderate Complexity: 1 Mod PT Treatments $Gait Training: 8-22 mins        Abran Richard, PT Acute Rehab Services Pager [phone] [facility] 05/27/2020, 5:44 PM

## 2020-05-27 NOTE — Anesthesia Postprocedure Evaluation (Signed)
Anesthesia Post Note  Patient: Ashley Perkins  Procedure(s) Performed: TOTAL KNEE ARTHROPLASTY (Right Knee)     Patient location during evaluation: PACU Anesthesia Type: Regional Level of consciousness: awake and alert Pain management: pain level controlled Vital Signs Assessment: post-procedure vital signs reviewed and stable Respiratory status: spontaneous breathing, nonlabored ventilation and respiratory function stable Cardiovascular status: blood pressure returned to baseline and stable Postop Assessment: no apparent nausea or vomiting Anesthetic complications: no   No complications documented.  Last Vitals:  Vitals:   05/27/20 1215 05/27/20 1245  BP: 118/66 (!) 162/77  Pulse: 65 61  Resp: 14 13  Temp:    SpO2: 100% 100%    Last Pain:  Vitals:   05/27/20 1245  TempSrc:   PainSc: 0-No pain                 Lynda Rainwater

## 2020-05-27 NOTE — Discharge Instructions (Signed)
 Frank Aluisio, MD Total Joint Specialist [facility] [address] [place], [place] [address] [phone]  TOTAL KNEE REPLACEMENT POSTOPERATIVE DIRECTIONS    Knee Rehabilitation, Guidelines Following Surgery  Results after knee surgery are often greatly improved when you follow the exercise, range of motion and muscle strengthening exercises prescribed by your doctor. Safety measures are also important to protect the knee from further injury. If any of these exercises cause you to have increased pain or swelling in your knee joint, decrease the amount until you are comfortable again and slowly increase them. If you have problems or questions, call your caregiver or physical therapist for advice.   BLOOD CLOT PREVENTION . Take a 325 mg Aspirin two times a day for three weeks following surgery. Then take an 81 mg Aspirin once a day for three weeks. Then discontinue Aspirin. . You may resume your vitamins/supplements upon discharge from the hospital. . Do not take any NSAIDs (Advil, Aleve, Ibuprofen, Meloxicam, etc.) until you have discontinued the 325 mg Aspirin.  HOME CARE INSTRUCTIONS  . Remove items at home which could result in a fall. This includes throw rugs or furniture in walking pathways.  . ICE to the affected knee as much as tolerated. Icing helps control swelling. If the swelling is well controlled you will be more comfortable and rehab easier. Continue to use ice on the knee for pain and swelling from surgery. You may notice swelling that will progress down to the foot and ankle. This is normal after surgery. Elevate the leg when you are not up walking on it.    . Continue to use the breathing machine which will help keep your temperature down. It is common for your temperature to cycle up and down following surgery, especially at night when you are not up moving around and exerting yourself. The breathing machine keeps your lungs expanded and your  temperature down. . Do not place pillow under the operative knee, focus on keeping the knee straight while resting  DIET You may resume your previous home diet once you are discharged from the hospital.  DRESSING / WOUND CARE / SHOWERING . Keep your bulky bandage on for 2 days. On the third post-operative day you may remove the Ace bandage and gauze. There is a waterproof adhesive bandage on your skin which will stay in place until your first follow-up appointment. Once you remove this you will not need to place another bandage . You may begin showering 3 days following surgery, but do not submerge the incision under water.  ACTIVITY For the first 5 days, the key is rest and control of pain and swelling . Do your home exercises twice a day starting on post-operative day 3. On the days you go to physical therapy, just do the home exercises once that day. . You should rest, ice and elevate the leg for 50 minutes out of every hour. Get up and walk/stretch for 10 minutes per hour. After 5 days you can increase your activity slowly as tolerated. . Walk with your walker as instructed. Use the walker until you are comfortable transitioning to a cane. Walk with the cane in the opposite hand of the operative leg. You may discontinue the cane once you are comfortable and walking steadily. . Avoid periods of inactivity such as sitting longer than an hour when not asleep. This helps prevent blood clots.  . You may discontinue the knee immobilizer once you are able to perform a straight   leg raise while lying down. . You may resume a sexual relationship in one month or when given the OK by your doctor.  . You may return to work once you are cleared by your doctor.  . Do not drive a car for 6 weeks or until released by your surgeon.  . Do not drive while taking narcotics.  TED HOSE STOCKINGS Wear the elastic stockings on both legs for three weeks following surgery during the day. You may remove them at night  for sleeping.  WEIGHT BEARING Weight bearing as tolerated with assist device (walker, cane, etc) as directed, use it as long as suggested by your surgeon or therapist, typically at least 4-6 weeks.  POSTOPERATIVE CONSTIPATION PROTOCOL Constipation - defined medically as fewer than three stools per week and severe constipation as less than one stool per week.  One of the most common issues patients have following surgery is constipation.  Even if you have a regular bowel pattern at home, your normal regimen is likely to be disrupted due to multiple reasons following surgery.  Combination of anesthesia, postoperative narcotics, change in appetite and fluid intake all can affect your bowels.  In order to avoid complications following surgery, here are some recommendations in order to help you during your recovery period.  . Colace (docusate) - Pick up an over-the-counter form of Colace or another stool softener and take twice a day as long as you are requiring postoperative pain medications.  Take with a full glass of water daily.  If you experience loose stools or diarrhea, hold the colace until you stool forms back up. If your symptoms do not get better within 1 week or if they get worse, check with your doctor. . Dulcolax (bisacodyl) - Pick up over-the-counter and take as directed by the product packaging as needed to assist with the movement of your bowels.  Take with a full glass of water.  Use this product as needed if not relieved by Colace only.  . MiraLax (polyethylene glycol) - Pick up over-the-counter to have on hand. MiraLax is a solution that will increase the amount of water in your bowels to assist with bowel movements.  Take as directed and can mix with a glass of water, juice, soda, coffee, or tea. Take if you go more than two days without a movement. Do not use MiraLax more than once per day. Call your doctor if you are still constipated or irregular after using this medication for 7 days  in a row.  If you continue to have problems with postoperative constipation, please contact the office for further assistance and recommendations.  If you experience "the worst abdominal pain ever" or develop nausea or vomiting, please contact the office immediatly for further recommendations for treatment.  ITCHING If you experience itching with your medications, try taking only a single pain pill, or even half a pain pill at a time.  You can also use Benadryl over the counter for itching or also to help with sleep.   MEDICATIONS See your medication summary on the "After Visit Summary" that the nursing staff will review with you prior to discharge.  You may have some home medications which will be placed on hold until you complete the course of blood thinner medication.  It is important for you to complete the blood thinner medication as prescribed by your surgeon.  Continue your approved medications as instructed at time of discharge.  PRECAUTIONS . If you experience chest pain or shortness of   breath - call 911 immediately for transfer to the hospital emergency department.  . If you develop a fever greater that 101 F, purulent drainage from wound, increased redness or drainage from wound, foul odor from the wound/dressing, or calf pain - CONTACT YOUR SURGEON.                                                   FOLLOW-UP APPOINTMENTS Make sure you keep all of your appointments after your operation with your surgeon and caregivers. You should call the office at the above phone number and make an appointment for approximately two weeks after the date of your surgery or on the date instructed by your surgeon outlined in the "After Visit Summary".  RANGE OF MOTION AND STRENGTHENING EXERCISES  Rehabilitation of the knee is important following a knee injury or an operation. After just a few days of immobilization, the muscles of the thigh which control the knee become weakened and shrink (atrophy). Knee  exercises are designed to build up the tone and strength of the thigh muscles and to improve knee motion. Often times heat used for twenty to thirty minutes before working out will loosen up your tissues and help with improving the range of motion but do not use heat for the first two weeks following surgery. These exercises can be done on a training (exercise) mat, on the floor, on a table or on a bed. Use what ever works the best and is most comfortable for you Knee exercises include:  . Leg Lifts - While your knee is still immobilized in a splint or cast, you can do straight leg raises. Lift the leg to 60 degrees, hold for 3 sec, and slowly lower the leg. Repeat 10-20 times 2-3 times daily. Perform this exercise against resistance later as your knee gets better.  . Quad and Hamstring Sets - Tighten up the muscle on the front of the thigh (Quad) and hold for 5-10 sec. Repeat this 10-20 times hourly. Hamstring sets are done by pushing the foot backward against an object and holding for 5-10 sec. Repeat as with quad sets.   Leg Slides: Lying on your back, slowly slide your foot toward your buttocks, bending your knee up off the floor (only go as far as is comfortable). Then slowly slide your foot back down until your leg is flat on the floor again.  Angel Wings: Lying on your back spread your legs to the side as far apart as you can without causing discomfort.  A rehabilitation program following serious knee injuries can speed recovery and prevent re-injury in the future due to weakened muscles. Contact your doctor or a physical therapist for more information on knee rehabilitation.   IF YOU ARE TRANSFERRED TO A SKILLED REHAB FACILITY If the patient is transferred to a skilled rehab facility following release from the hospital, a list of the current medications will be sent to the facility for the patient to continue.  When discharged from the skilled rehab facility, please have the facility set up the  patient's Home Health Physical Therapy prior to being released. Also, the skilled facility will be responsible for providing the patient with their medications at time of release from the facility to include their pain medication, the muscle relaxants, and their blood thinner medication. If the patient is still at the   rehab facility at time of the two week follow up appointment, the skilled rehab facility will also need to assist the patient in arranging follow up appointment in our office and any transportation needs.  MAKE SURE YOU:  . Understand these instructions.  . Get help right away if you are not doing well or get worse.   DENTAL ANTIBIOTICS:  In most cases prophylactic antibiotics for Dental procdeures after total joint surgery are not necessary.  Exceptions are as follows:  1. History of prior total joint infection  2. Severely immunocompromised (Organ Transplant, cancer chemotherapy, Rheumatoid biologic meds such as Humera)  3. Poorly controlled diabetes (A1C &gt; 8.0, blood glucose over 200)  If you have one of these conditions, contact your surgeon for an antibiotic prescription, prior to your dental procedure.    Pick up stool softner and laxative for home use following surgery while on pain medications. Do not submerge incision under water. Please use good hand washing techniques while changing dressing each day. May shower starting three days after surgery. Please use a clean towel to pat the incision dry following showers. Continue to use ice for pain and swelling after surgery. Do not use any lotions or creams on the incision until instructed by your surgeon.  

## 2020-05-27 NOTE — Progress Notes (Signed)
Orthopedic Tech Progress Note Patient Details:  Ashley Perkins 1940/09/15 [id number]  CPM Right Knee CPM Right Knee: On Right Knee Flexion (Degrees): 40 Right Knee Extension (Degrees): 10  Post Interventions Patient Tolerated: Well Instructions Provided: Care of device  [place] Pink 05/27/2020, 12:46 PM

## 2020-05-27 NOTE — Interval H&P Note (Signed)
History and Physical Interval Note:  05/27/2020 8:31 AM  Ashley Perkins  has presented today for surgery, with the diagnosis of right knee osteoarthritis.  The various methods of treatment have been discussed with the patient and family. After consideration of risks, benefits and other options for treatment, the patient has consented to  Procedure(s) with comments: TOTAL KNEE ARTHROPLASTY (Right) - 18min as a surgical intervention.  The patient's history has been reviewed, patient examined, no change in status, stable for surgery.  I have reviewed the patient's chart and labs.  Questions were answered to the patient's satisfaction.     Pilar Plate Audia Amick

## 2020-05-28 ENCOUNTER — Encounter (HOSPITAL_COMMUNITY): Payer: Self-pay | Admitting: Orthopedic Surgery

## 2020-05-28 DIAGNOSIS — M1711 Unilateral primary osteoarthritis, right knee: Secondary | ICD-10-CM | POA: Diagnosis not present

## 2020-05-28 LAB — CBC
HCT: 34.8 % — ABNORMAL LOW (ref 36.0–46.0)
Hemoglobin: 11.4 g/dL — ABNORMAL LOW (ref 12.0–15.0)
MCH: 30.2 pg (ref 26.0–34.0)
MCHC: 32.8 g/dL (ref 30.0–36.0)
MCV: 92.3 fL (ref 80.0–100.0)
Platelets: 226 10*3/uL (ref 150–400)
RBC: 3.77 MIL/uL — ABNORMAL LOW (ref 3.87–5.11)
RDW: 13.8 % (ref 11.5–15.5)
WBC: 10.4 10*3/uL (ref 4.0–10.5)
nRBC: 0 % (ref 0.0–0.2)

## 2020-05-28 LAB — BASIC METABOLIC PANEL
Anion gap: 9 (ref 5–15)
BUN: 12 mg/dL (ref 8–23)
CO2: 25 mmol/L (ref 22–32)
Calcium: 8.4 mg/dL — ABNORMAL LOW (ref 8.9–10.3)
Chloride: 99 mmol/L (ref 98–111)
Creatinine, Ser: 0.75 mg/dL (ref 0.44–1.00)
GFR, Estimated: >60 mL/min (ref >60)
Glucose, Bld: 183 mg/dL — ABNORMAL HIGH (ref 70–99)
Potassium: 3.5 mmol/L (ref 3.5–5.1)
Sodium: 133 mmol/L — ABNORMAL LOW (ref 135–145)

## 2020-05-28 LAB — GLUCOSE, CAPILLARY
Glucose-Capillary: 144 mg/dL — ABNORMAL HIGH (ref 70–99)
Glucose-Capillary: 203 mg/dL — ABNORMAL HIGH (ref 70–99)

## 2020-05-28 MED ORDER — METHOCARBAMOL 500 MG PO TABS
500.0000 mg | ORAL_TABLET | Freq: Four times a day (QID) | ORAL | 0 refills | Status: AC | PRN
Start: 2020-05-28 — End: ?

## 2020-05-28 MED ORDER — TRAMADOL HCL 50 MG PO TABS: 50.0000 mg | ORAL_TABLET | Freq: Four times a day (QID) | ORAL | 0 refills | Status: AC | PRN

## 2020-05-28 MED ORDER — ASPIRIN 325 MG PO TBEC: 325.0000 mg | DELAYED_RELEASE_TABLET | Freq: Two times a day (BID) | ORAL | 0 refills | Status: AC

## 2020-05-28 MED ORDER — OXYCODONE HCL 5 MG PO TABS: 5.0000 mg | ORAL_TABLET | Freq: Four times a day (QID) | ORAL | 0 refills | Status: AC | PRN

## 2020-05-28 NOTE — Progress Notes (Signed)
   Subjective: 1 Day Post-Op Procedure(s) (LRB): TOTAL KNEE ARTHROPLASTY (Right) Patient reports pain as mild.   Patient seen in rounds by Dr. Wynelle Link. Patient is well, and has had no acute complaints or problems other than pain in the right knee. Denies chest pain, SOB, or calf pain. No issues overnight. Foley catheter to be removed this AM. We will continue therapy today, ambulated 125' yesterday.  Objective: Vital signs in last 24 hours: Temp:  [97 F (36.1 C)-100 F (37.8 C)] 98.2 F (36.8 C) (03/15 0548) Pulse Rate:  [57-87] 75 (03/15 0548) Resp:  [6-24] 16 (03/15 0548) BP: (118-162)/(62-83) 120/62 (03/15 0548) SpO2:  [93 %-100 %] 100 % (03/15 0548) Weight:  [81.2 kg] 81.2 kg (03/14 0841)  Intake/Output from previous day:  Intake/Output Summary (Last 24 hours) at 05/28/2020 0714 Last data filed at 05/28/2020 0600 Gross per 24 hour  Intake 2874.13 ml  Output 3550 ml  Net -675.87 ml     Intake/Output this shift: No intake/output data recorded.  Labs: Recent Labs    05/28/20 0248  HGB 11.4*   Recent Labs    05/28/20 0248  WBC 10.4  RBC 3.77*  HCT 34.8*  PLT 226   Recent Labs    05/28/20 0248  NA 133*  K 3.5  CL 99  CO2 25  BUN 12  CREATININE 0.75  GLUCOSE 183*  CALCIUM 8.4*   No results for input(s): LABPT, INR in the last 72 hours.  Exam: General - Patient is Alert and Oriented Extremity - Neurologically intact Neurovascular intact Sensation intact distally Dorsiflexion/Plantar flexion intact Dressing - dressing C/D/I Motor Function - intact, moving foot and toes well on exam.   Past Medical History:  Diagnosis Date  . Arthritis    knees,  . Cancer ([place])    skin head  . Complication of anesthesia   . Diabetes mellitus without complication ([place])   . Fibromyalgia   . Hypercholesteremia   . Hypothyroidism   . PONV (postoperative nausea and vomiting)     Assessment/Plan: 1 Day Post-Op Procedure(s) (LRB): TOTAL KNEE ARTHROPLASTY  (Right) Active Problems:   Osteoarthritis of right knee  Estimated body mass index is 27.22 kg/m as calculated from the following:   Height as of this encounter: 5\' 8"  (1.727 m).   Weight as of this encounter: 81.2 kg. Advance diet Up with therapy D/C IV fluids   Patient's anticipated LOS is less than 2 midnights, meeting these requirements: - Younger than 47 - Lives within 1 hour of care - Has a competent adult at home to recover with post-op recover - NO history of  - Chronic pain requiring opiods  - Diabetes  - Coronary Artery Disease  - Heart failure  - Heart attack  - Stroke  - DVT/VTE  - Cardiac arrhythmia  - Respiratory Failure/COPD  - Renal failure  - Anemia  - Advanced Liver disease  DVT Prophylaxis - Aspirin Weight bearing as tolerated. Continue therapy.  Plan is to go Home after hospital stay. Plan for discharge later today if progresses with therapy and is meeting her goals. Scheduled for OPPT at [facility] Follow-up in the office in 2 weeks  The PDMP database was reviewed today prior to any opioid medications being prescribed to this patient.  Theresa Duty, PA-C Orthopedic Surgery [phone] 05/28/2020, 7:14 AM

## 2020-05-28 NOTE — Plan of Care (Signed)
Plan of care reviewed and discussed with the patient. 

## 2020-05-28 NOTE — TOC Transition Note (Signed)
Transition of Care [facility]) - CM/SW Discharge Note   Patient Details  Name: Ashley Perkins MRN: [id number] Date of Birth: 1940-07-18  [facility] CM/SW Contact:  Lennart Pall, LCSW Phone Number: 05/28/2020, 10:55 AM   Clinical Narrative:    Met briefly with pt and confirmed she has all needed DME needed at home. Plan for OPPT at [facility].  No further TOC needs.   Final next level of care: OP Rehab Barriers to Discharge: No Barriers Identified   Patient Goals and CMS Choice Patient states their goals for this hospitalization and ongoing recovery are:: return home      Discharge Placement                       Discharge Plan and Services                DME Arranged: N/A DME Agency: NA                  Social Determinants of Health (SDOH) Interventions     Readmission Risk Interventions No flowsheet data found.

## 2020-05-28 NOTE — Progress Notes (Signed)
Physical Therapy Treatment Patient Details Name: Ashley Perkins MRN: [id number] DOB: 03-07-1941 Today's Date: 05/28/2020    History of Present Illness Pt is 80 yo female s/p R TKA on 05/27/20.  Pt with PMH of fibromyalgia, DM, arthritis, CA, Pt is 80 yo female s/p R TKA on 05/27/20.  Pt with PMH of fibromyalgia, DM, arthritis, CA, and L TKA.    PT Comments    Pt reports stiff knee so sat EOB and performed knee flexion prior to ambulating.  Pt with limited ambulation distance this morning due to dizziness.   Follow Up Recommendations  Follow surgeon's recommendation for DC plan and follow-up therapies;Supervision for mobility/OOB     Equipment Recommendations  None recommended by PT    Recommendations for Other Services       Precautions / Restrictions Precautions Precautions: Fall;Knee Restrictions RLE Weight Bearing: Weight bearing as tolerated    Mobility  Bed Mobility Overal bed mobility: Needs Assistance Bed Mobility: Supine to Sit;Sit to Supine     Supine to sit: Min guard Sit to supine: Min assist   General bed mobility comments: cues for self assist; assist for R LE onto bed for pain control    Transfers Overall transfer level: Needs assistance Equipment used: Rolling walker (2 wheeled) Transfers: Sit to/from Stand Sit to Stand: Min guard         General transfer comment: verbal cues for UE and LE positioning  Ambulation/Gait Ambulation/Gait assistance: Min guard Gait Distance (Feet): 30 Feet Assistive device: Rolling walker (2 wheeled) Gait Pattern/deviations: Step-through pattern;Decreased stride length;Trunk flexed Gait velocity: decreased   General Gait Details: verbal cues for sequence, RW positioning, posture, step length; pt reports dizziness so limited distance (believes from pain meds upon returning to room)   Stairs             Wheelchair Mobility    Modified Rankin (Stroke Patients Only)       Balance                                             Cognition Arousal/Alertness: Awake/alert Behavior During Therapy: WFL for tasks assessed/performed Overall Cognitive Status: Within Functional Limits for tasks assessed                                        Exercises Total Joint Exercises Knee Flexion: AAROM;Right;10 reps;Seated    General Comments        Pertinent Vitals/Pain Pain Assessment: 0-10 Pain Score: 6  Pain Location: R knee Pain Descriptors / Indicators: Sore;Aching Pain Intervention(s): Repositioned;Monitored during session;Limited activity within patient's tolerance    Home Living                      Prior Function            PT Goals (current goals can now be found in the care plan section) Progress towards PT goals: Progressing toward goals    Frequency    7X/week      PT Plan Current plan remains appropriate    Co-evaluation              AM-PAC PT "6 Clicks" Mobility   Outcome Measure  Help needed turning from your back to your side while in a flat  bed without using bedrails?: A Little Help needed moving from lying on your back to sitting on the side of a flat bed without using bedrails?: A Little Help needed moving to and from a bed to a chair (including a wheelchair)?: A Little Help needed standing up from a chair using your arms (e.g., wheelchair or bedside chair)?: A Little Help needed to walk in hospital room?: A Little Help needed climbing 3-5 steps with a railing? : A Lot 6 Click Score: 17    End of Session Equipment Utilized During Treatment: Gait belt Activity Tolerance: Patient tolerated treatment well Patient left: in bed;with call bell/phone within reach;with bed alarm set   PT Visit Diagnosis: Other abnormalities of gait and mobility (R26.89);Muscle weakness (generalized) (M62.81)     Time: 6468-0321 PT Time Calculation (min) (ACUTE ONLY): 18 min  Charges:  $Gait Training: 8-22 mins                     Jannette Spanner PT, DPT Acute Rehabilitation Services Pager: [phone] Office: [phone]  Trena Platt 05/28/2020, 12:35 PM

## 2020-05-28 NOTE — Progress Notes (Signed)
Physical Therapy Treatment Patient Details Name: Ashley Perkins MRN: [id number] DOB: 02/23/1941 Today's Date: 05/28/2020    History of Present Illness Pt is 80 yo female s/p R TKA on 05/27/20.  Pt with PMH of fibromyalgia, DM, arthritis, CA, Pt is 80 yo female s/p R TKA on 05/27/20.  Pt with PMH of fibromyalgia, DM, arthritis, CA, and L TKA.    PT Comments    Pt ambulated in hallway and practiced safe stair technique.  Pt feels ready for d/c home. Pt declined performing exercises to save energy for d/c home today however has had previous left TKA and able to recall, so provided HEP handout.  Pt had no further questions and feels ready for d/c home today.   Follow Up Recommendations  Follow surgeon's recommendation for DC plan and follow-up therapies;Supervision for mobility/OOB     Equipment Recommendations  None recommended by PT    Recommendations for Other Services       Precautions / Restrictions Precautions Precautions: Fall;Knee Restrictions RLE Weight Bearing: Weight bearing as tolerated    Mobility  Bed Mobility Overal bed mobility: Needs Assistance Bed Mobility: Supine to Sit;Sit to Supine     Supine to sit: Supervision Sit to supine: Supervision   General bed mobility comments: cues for self assist with gait belt    Transfers Overall transfer level: Needs assistance Equipment used: Rolling walker (2 wheeled) Transfers: Sit to/from Stand Sit to Stand: Min guard         General transfer comment: verbal cues for UE and LE positioning  Ambulation/Gait Ambulation/Gait assistance: Min guard Gait Distance (Feet): 60 Feet Assistive device: Rolling walker (2 wheeled) Gait Pattern/deviations: Step-through pattern;Decreased stride length;Trunk flexed Gait velocity: decreased   General Gait Details: verbal cues for sequence, RW positioning, posture, step length; no dizziness this afternoon   Stairs Stairs: Yes Stairs assistance: Min guard Stair  Management: Backwards;Step to pattern;With walker Number of Stairs: 2 General stair comments: pt able to verbalize correct sequence (prev L TKA last year); performed safely, aware to have person assist with holding RW   Wheelchair Mobility    Modified Rankin (Stroke Patients Only)       Balance                                            Cognition Arousal/Alertness: Awake/alert Behavior During Therapy: WFL for tasks assessed/performed Overall Cognitive Status: Within Functional Limits for tasks assessed                                        Exercises Total Joint Exercises Knee Flexion: AAROM;Right;10 reps;Seated    General Comments        Pertinent Vitals/Pain Pain Assessment: 0-10 Pain Score: 5  Pain Location: R knee Pain Descriptors / Indicators: Sore;Aching Pain Intervention(s): Monitored during session;Repositioned;Premedicated before session    Home Living                      Prior Function            PT Goals (current goals can now be found in the care plan section) Progress towards PT goals: Progressing toward goals    Frequency    7X/week      PT Plan Current plan remains appropriate  Co-evaluation              AM-PAC PT "6 Clicks" Mobility   Outcome Measure  Help needed turning from your back to your side while in a flat bed without using bedrails?: A Little Help needed moving from lying on your back to sitting on the side of a flat bed without using bedrails?: A Little Help needed moving to and from a bed to a chair (including a wheelchair)?: A Little Help needed standing up from a chair using your arms (e.g., wheelchair or bedside chair)?: A Little Help needed to walk in hospital room?: A Little Help needed climbing 3-5 steps with a railing? : A Little 6 Click Score: 18    End of Session Equipment Utilized During Treatment: Gait belt Activity Tolerance: Patient tolerated treatment  well Patient left: in bed;with call bell/phone within reach Nurse Communication: Mobility status PT Visit Diagnosis: Other abnormalities of gait and mobility (R26.89);Muscle weakness (generalized) (M62.81)     Time: 4360-1658 PT Time Calculation (min) (ACUTE ONLY): 20 min  Charges:  $Gait Training: 8-22 mins                    Jannette Spanner PT, DPT Acute Rehabilitation Services Pager: [phone] Office: [phone]  York Ram E 05/28/2020, 3:10 PM

## 2020-05-29 NOTE — Discharge Summary (Signed)
Physician Discharge Summary   Patient ID: Ashley Perkins MRN: [id number] DOB/AGE: 1940/07/03 80 y.o.  Admit date: 05/27/2020 Discharge date: 05/28/2020  Primary Diagnosis: Osteoarthritis, right knee   Admission Diagnoses:  Past Medical History:  Diagnosis Date  . Arthritis    knees,  . Cancer ([place])    skin head  . Complication of anesthesia   . Diabetes mellitus without complication ([place])   . Fibromyalgia   . Hypercholesteremia   . Hypothyroidism   . PONV (postoperative nausea and vomiting)    Discharge Diagnoses:   Active Problems:   Osteoarthritis of right knee  Estimated body mass index is 27.22 kg/m as calculated from the following:   Height as of this encounter: 5\' 8"  (1.727 m).   Weight as of this encounter: 81.2 kg.  Procedure:  Procedure(s) (LRB): TOTAL KNEE ARTHROPLASTY (Right)   Consults: None  HPI: Ashley Perkins is a 80 y.o. year old female with end stage OA of her right knee with progressively worsening pain and dysfunction. She has constant pain, with activity and at rest and significant functional deficits with difficulties even with ADLs. She has had extensive non-op management including analgesics, injections of cortisone and viscosupplements, and home exercise program, but remains in significant pain with significant dysfunction.Radiographs show bone on bone arthritis lateral and patellofemoral. She presents now for right Total Knee Arthroplasty.   Laboratory Data: Admission on 05/27/2020, Discharged on 05/28/2020  Component Date Value Ref Range Status  . Glucose-Capillary 05/27/2020 138* 70 - 99 mg/dL Final   Glucose reference range applies only to samples taken after fasting for at least 8 hours.  . Glucose-Capillary 05/27/2020 161* 70 - 99 mg/dL Final   Glucose reference range applies only to samples taken after fasting for at least 8 hours.  . WBC 05/28/2020 10.4  4.0 - 10.5 K/uL Final  . RBC 05/28/2020 3.77* 3.87 - 5.11 MIL/uL Final  .  Hemoglobin 05/28/2020 11.4* 12.0 - 15.0 g/dL Final  . HCT 05/28/2020 34.8* 36.0 - 46.0 % Final  . MCV 05/28/2020 92.3  80.0 - 100.0 fL Final  . MCH 05/28/2020 30.2  26.0 - 34.0 pg Final  . MCHC 05/28/2020 32.8  30.0 - 36.0 g/dL Final  . RDW 05/28/2020 13.8  11.5 - 15.5 % Final  . Platelets 05/28/2020 226  150 - 400 K/uL Final  . nRBC 05/28/2020 0.0  0.0 - 0.2 % Final   Performed at [facility], [place] [address], [place], [place] [address]  . Sodium 05/28/2020 133* 135 - 145 mmol/L Final  . Potassium 05/28/2020 3.5  3.5 - 5.1 mmol/L Final  . Chloride 05/28/2020 99  98 - 111 mmol/L Final  . CO2 05/28/2020 25  22 - 32 mmol/L Final  . Glucose, Bld 05/28/2020 183* 70 - 99 mg/dL Final   Glucose reference range applies only to samples taken after fasting for at least 8 hours.  . BUN 05/28/2020 12  8 - 23 mg/dL Final  . Creatinine, Ser 05/28/2020 0.75  0.44 - 1.00 mg/dL Final  . Calcium 05/28/2020 8.4* 8.9 - 10.3 mg/dL Final  . GFR, Estimated 05/28/2020 >60  >60 mL/min Final   Comment: (NOTE) Calculated using the CKD-EPI Creatinine Equation (2021)   . Anion gap 05/28/2020 9  5 - 15 Final   Performed at [facility], [place] [address] [place], [place] [address]  . Glucose-Capillary 05/27/2020 220* 70 - 99 mg/dL Final   Glucose reference range applies only to samples  taken after fasting for at least 8 hours.  . Glucose-Capillary 05/27/2020 262* 70 - 99 mg/dL Final   Glucose reference range applies only to samples taken after fasting for at least 8 hours.  . Glucose-Capillary 05/28/2020 144* 70 - 99 mg/dL Final   Glucose reference range applies only to samples taken after fasting for at least 8 hours.  . Glucose-Capillary 05/28/2020 203* 70 - 99 mg/dL Final   Glucose reference range applies only to samples taken after fasting for at least 8 hours.  Hospital Outpatient Visit on 05/23/2020  Component Date Value Ref Range Status  . SARS Coronavirus 2 05/23/2020  NEGATIVE  NEGATIVE Final   Comment: (NOTE) SARS-CoV-2 target nucleic acids are NOT DETECTED.  The SARS-CoV-2 RNA is generally detectable in upper and lower respiratory specimens during the acute phase of infection. Negative results do not preclude SARS-CoV-2 infection, do not rule out co-infections with other pathogens, and should not be used as the sole basis for treatment or other patient management decisions. Negative results must be combined with clinical observations, patient history, and epidemiological information. The expected result is Negative.  Fact Sheet for Patients: SugarRoll.be  Fact Sheet for Healthcare Providers: https://www.woods-mathews.com/  This test is not yet approved or cleared by the [place] FDA and  has been authorized for detection and/or diagnosis of SARS-CoV-2 by FDA under an Emergency Use Authorization (EUA). This EUA will remain  in effect (meaning this test can be used) for the duration of the COVID-19 declaration under Se                          ction 564(b)(1) of the Act, 21 U.S.C. section 360bbb-3(b)(1), unless the authorization is terminated or revoked sooner.  Performed at [facility], [place] [address], [place], [place] [address]   Hospital Outpatient Visit on 05/17/2020  Component Date Value Ref Range Status  . MRSA, PCR 05/17/2020 NEGATIVE  NEGATIVE Final  . Staphylococcus aureus 05/17/2020 NEGATIVE  NEGATIVE Final   Comment: (NOTE) The Xpert SA Assay (FDA approved for NASAL specimens in patients 7 years of age and older), is one component of a comprehensive surveillance program. It is not intended to diagnose infection nor to guide or monitor treatment. Performed at [facility], [place] [address] [place], [place] [address]   . WBC 05/17/2020 5.5  4.0 - 10.5 K/uL Final  . RBC 05/17/2020 4.26  3.87 - 5.11 MIL/uL Final  . Hemoglobin 05/17/2020 12.9  12.0 - 15.0 g/dL  Final  . HCT 05/17/2020 39.6  36.0 - 46.0 % Final  . MCV 05/17/2020 93.0  80.0 - 100.0 fL Final  . MCH 05/17/2020 30.3  26.0 - 34.0 pg Final  . MCHC 05/17/2020 32.6  30.0 - 36.0 g/dL Final  . RDW 05/17/2020 14.0  11.5 - 15.5 % Final  . Platelets 05/17/2020 213  150 - 400 K/uL Final  . nRBC 05/17/2020 0.0  0.0 - 0.2 % Final   Performed at [facility], [place] [address] [place], [place] [address]  . Sodium 05/17/2020 135  135 - 145 mmol/L Final  . Potassium 05/17/2020 4.3  3.5 - 5.1 mmol/L Final  . Chloride 05/17/2020 99  98 - 111 mmol/L Final  . CO2 05/17/2020 27  22 - 32 mmol/L Final  . Glucose, Bld 05/17/2020 146* 70 - 99 mg/dL Final   Glucose reference range applies only to samples taken after fasting for at least  8 hours.  . BUN 05/17/2020 14  8 - 23 mg/dL Final  . Creatinine, Ser 05/17/2020 0.81  0.44 - 1.00 mg/dL Final  . Calcium 05/17/2020 8.9  8.9 - 10.3 mg/dL Final  . Total Protein 05/17/2020 6.4* 6.5 - 8.1 g/dL Final  . Albumin 05/17/2020 3.7  3.5 - 5.0 g/dL Final  . AST 05/17/2020 23  15 - 41 U/L Final  . ALT 05/17/2020 16  0 - 44 U/L Final  . Alkaline Phosphatase 05/17/2020 70  38 - 126 U/L Final  . Total Bilirubin 05/17/2020 0.9  0.3 - 1.2 mg/dL Final  . GFR, Estimated 05/17/2020 >60  >60 mL/min Final   Comment: (NOTE) Calculated using the CKD-EPI Creatinine Equation (2021)   . Anion gap 05/17/2020 9  5 - 15 Final   Performed at [facility], [place] [address], [place], [place] [address]  . Prothrombin Time 05/17/2020 13.2  11.4 - 15.2 seconds Final  . INR 05/17/2020 1.0  0.8 - 1.2 Final   Comment: (NOTE) INR goal varies based on device and disease states. Performed at [facility], [place] [address], [place], [place] [address]   . aPTT 05/17/2020 33  24 - 36 seconds Final   Performed at [facility], [place] [address] [place], [place] [address]  . ABO/RH(D) 05/17/2020 O POS   Final  . Antibody  Screen 05/17/2020 NEG   Final  . Sample Expiration 05/17/2020    Final                   Value:05/30/2020,2359 Performed at [facility], [place] [address], [place], [place] [address]   . Glucose-Capillary 05/17/2020 138* 70 - 99 mg/dL Final   Glucose reference range applies only to samples taken after fasting for at least 8 hours.  . Hgb A1c MFr Bld 05/17/2020 7.1* 4.8 - 5.6 % Final   Comment: (NOTE) Pre diabetes:          5.7%-6.4%  Diabetes:              >6.4%  Glycemic control for   <7.0% adults with diabetes   . Mean Plasma Glucose 05/17/2020 157.07  mg/dL Final   Performed at [place] [address] [place], [place] [address]     X-Rays:No results found.  EKG: Orders placed or performed during the hospital encounter of 09/25/19  . EKG 12-Lead  . EKG 12-Lead     Hospital Course: Ashley Perkins is a 80 y.o. who was admitted to [facility]. They were brought to the operating room on 05/27/2020 and underwent Procedure(s): TOTAL KNEE ARTHROPLASTY.  Patient tolerated the procedure well and was later transferred to the recovery room and then to the orthopaedic floor for postoperative care. They were given PO and IV analgesics for pain control following their surgery. They were given 24 hours of postoperative antibiotics of  Anti-infectives (From admission, onward)   Start     Dose/Rate Route Frequency Ordered Stop   05/27/20 1700  ceFAZolin (ANCEF) IVPB 2g/100 mL premix        2 g 200 mL/hr over 30 Minutes Intravenous Every 6 hours 05/27/20 1530 05/27/20 2353   05/27/20 0815  ceFAZolin (ANCEF) IVPB 2g/100 mL premix        2 g 200 mL/hr over 30 Minutes Intravenous On call to O.R. 05/27/20 4034 05/27/20 1135     and started on DVT prophylaxis in the form of  Aspirin.   PT and OT were ordered for total joint protocol. Discharge planning consulted to help with postop disposition and equipment needs.  Patient had a good night on the evening of  surgery. They started to get up OOB with therapy on POD #0. Pt was seen during rounds and was ready to go home pending progress with therapy. She worked with therapy on POD #1 and was meeting her goals. Pt was discharged to home later that day in stable condition.  Diet: Diabetic diet Activity: WBAT Follow-up: in 2 weeks Disposition: Home with OPPT Discharged Condition: stable   Discharge Instructions    Call MD / Call 911   Complete by: As directed    If you experience chest pain or shortness of breath, CALL 911 and be transported to the hospital emergency room.  If you develope a fever above 101 F, pus (white drainage) or increased drainage or redness at the wound, or calf pain, call your surgeon's office.   Change dressing   Complete by: As directed    You may remove the bulky bandage (ACE wrap and gauze) two days after surgery. You will have an adhesive waterproof bandage underneath. Leave this in place until your first follow-up appointment.   Constipation Prevention   Complete by: As directed    Drink plenty of fluids.  Prune juice may be helpful.  You may use a stool softener, such as Colace (over the counter) 100 mg twice a day.  Use MiraLax (over the counter) for constipation as needed.   Diet - low sodium heart healthy   Complete by: As directed    Do not put a pillow under the knee. Place it under the heel.   Complete by: As directed    Driving restrictions   Complete by: As directed    No driving for two weeks   TED hose   Complete by: As directed    Use stockings (TED hose) for three weeks on both leg(s).  You may remove them at night for sleeping.   Weight bearing as tolerated   Complete by: As directed      Allergies as of 05/28/2020      Reactions   Hydroxychloroquine Hives, Rash   All over   Hydroxychloroquine Sulfate Hives, Rash   Amoxicillin-pot Clavulanate Nausea And Vomiting      Medication List    STOP taking these medications    HYDROcodone-acetaminophen 5-325 MG tablet Commonly known as: NORCO/VICODIN     TAKE these medications   amitriptyline 75 MG tablet Commonly known as: ELAVIL Take 75 mg by mouth at bedtime.   aspirin 325 MG EC tablet Take 1 tablet (325 mg total) by mouth 2 (two) times daily for 20 days. Then take one 81 mg aspirin once a day for three weeks. Then discontinue aspirin.   atorvastatin 10 MG tablet Commonly known as: LIPITOR Take 10 mg by mouth daily.   cholecalciferol 25 MCG (1000 UNIT) tablet Commonly known as: VITAMIN D3 Take 1,000 Units by mouth daily.   gabapentin 300 MG capsule Commonly known as: NEURONTIN Take 300 mg by mouth 4 (four) times daily.   glimepiride 2 MG tablet Commonly known as: AMARYL Take 2 mg by mouth in the morning and at bedtime.   levothyroxine 88 MCG tablet Commonly known as: SYNTHROID Take 88 mcg by mouth daily before breakfast.   metFORMIN 500 MG tablet Commonly known as: GLUCOPHAGE Take 1,000 mg by mouth 2 (two) times daily with a meal.  methocarbamol 500 MG tablet Commonly known as: ROBAXIN Take 1 tablet (500 mg total) by mouth every 6 (six) hours as needed for muscle spasms.   multivitamin with minerals Tabs tablet Take 1 tablet by mouth daily.   oxyCODONE 5 MG immediate release tablet Commonly known as: Oxy IR/ROXICODONE Take 1-2 tablets (5-10 mg total) by mouth every 6 (six) hours as needed for severe pain. Not to exceed 6 tablets a day.   pioglitazone 15 MG tablet Commonly known as: ACTOS Take 15 mg by mouth daily.   traMADol 50 MG tablet Commonly known as: ULTRAM Take 1-2 tablets (50-100 mg total) by mouth every 6 (six) hours as needed for moderate pain.   VITAMIN B COMPLEX PO Take 1 tablet by mouth every evening.   vitamin B-12 1000 MCG tablet Commonly known as: CYANOCOBALAMIN Take 1,000 mcg by mouth daily.            Discharge Care Instructions  (From admission, onward)         Start     Ordered   05/28/20  0000  Weight bearing as tolerated        05/28/20 0720   05/28/20 0000  Change dressing       Comments: You may remove the bulky bandage (ACE wrap and gauze) two days after surgery. You will have an adhesive waterproof bandage underneath. Leave this in place until your first follow-up appointment.   05/28/20 0720          Follow-up Information    Gaynelle Arabian, MD. Schedule an appointment as soon as possible for a visit on 06/11/2020.   Specialty: Orthopedic Surgery Contact information: [address] [place] [address] [phone]               Signed: Theresa Duty, PA-C Orthopedic Surgery 05/29/2020, 9:07 AM

## 2020-07-24 DIAGNOSIS — R202 Paresthesia of skin: Secondary | ICD-10-CM | POA: Insufficient documentation

## 2020-08-27 ENCOUNTER — Encounter: Payer: Self-pay | Admitting: Podiatry

## 2020-08-27 ENCOUNTER — Other Ambulatory Visit: Payer: Self-pay

## 2020-08-27 ENCOUNTER — Ambulatory Visit: Payer: Medicare Other | Admitting: Podiatry

## 2020-08-27 DIAGNOSIS — Z8719 Personal history of other diseases of the digestive system: Secondary | ICD-10-CM | POA: Insufficient documentation

## 2020-08-27 DIAGNOSIS — K589 Irritable bowel syndrome without diarrhea: Secondary | ICD-10-CM | POA: Insufficient documentation

## 2020-08-27 DIAGNOSIS — L84 Corns and callosities: Secondary | ICD-10-CM | POA: Diagnosis not present

## 2020-08-27 DIAGNOSIS — M779 Enthesopathy, unspecified: Secondary | ICD-10-CM | POA: Diagnosis not present

## 2020-08-27 DIAGNOSIS — M79675 Pain in left toe(s): Secondary | ICD-10-CM

## 2020-08-27 DIAGNOSIS — M79674 Pain in right toe(s): Secondary | ICD-10-CM

## 2020-08-27 DIAGNOSIS — Z85828 Personal history of other malignant neoplasm of skin: Secondary | ICD-10-CM | POA: Insufficient documentation

## 2020-08-27 DIAGNOSIS — I868 Varicose veins of other specified sites: Secondary | ICD-10-CM | POA: Insufficient documentation

## 2020-08-27 DIAGNOSIS — G479 Sleep disorder, unspecified: Secondary | ICD-10-CM | POA: Insufficient documentation

## 2020-08-27 DIAGNOSIS — F419 Anxiety disorder, unspecified: Secondary | ICD-10-CM | POA: Insufficient documentation

## 2020-08-27 DIAGNOSIS — Z800 Family history of malignant neoplasm of digestive organs: Secondary | ICD-10-CM | POA: Insufficient documentation

## 2020-08-27 DIAGNOSIS — F339 Major depressive disorder, recurrent, unspecified: Secondary | ICD-10-CM | POA: Insufficient documentation

## 2020-08-27 DIAGNOSIS — E210 Primary hyperparathyroidism: Secondary | ICD-10-CM | POA: Insufficient documentation

## 2020-08-27 DIAGNOSIS — G4700 Insomnia, unspecified: Secondary | ICD-10-CM | POA: Insufficient documentation

## 2020-08-27 DIAGNOSIS — B351 Tinea unguium: Secondary | ICD-10-CM

## 2020-08-27 DIAGNOSIS — H269 Unspecified cataract: Secondary | ICD-10-CM | POA: Insufficient documentation

## 2020-08-27 DIAGNOSIS — K219 Gastro-esophageal reflux disease without esophagitis: Secondary | ICD-10-CM | POA: Insufficient documentation

## 2020-08-27 NOTE — Progress Notes (Signed)
This patient returns to my office for at risk foot care.  This patient requires this care by a professional since this patient will be at risk due to having diabetes.    This patient is unable to cut nails herself since the patient cannot reach her nails.These nails are painful walking and wearing shoes. Patient has painful callus on right forefoot.  Patient has history of foot surgery including EPF.  This patient presents for at risk foot care today.  General Appearance  Alert, conversant and in no acute stress.  Vascular  Dorsalis pedis and posterior tibial  pulses are weakly palpable  bilaterally.  Capillary return is within normal limits  bilaterally. Temperature is within normal limits  bilaterally.  Neurologic  Senn-Weinstein monofilament wire test within normal limits  bilaterally. Muscle power within normal limits bilaterally.  Nails Thick disfigured discolored nails with subungual debris  from hallux to fifth toes bilaterally. No evidence of bacterial infection or drainage bilaterally.  Orthopedic  No limitations of motion  feet .  No crepitus or effusions noted.  HAV  B/L.  Hammer toes 2-5  B/l.  Capsulitis sub 3 right.  Midfoot  DJD B/L.  Skin  normotropic skin noted bilaterally.  No signs of infections or ulcers noted.   Callus right forefoot.    Onychomycosis  Pain in right toes  Pain in left toes  Callus right forefoot.  Consent was obtained for treatment procedures.   Mechanical debridement of nails 1-5  bilaterally performed with a nail nipper.  Filed with dremel without incident.  Debrided callus right forefoot with # 15 blade.  Gave gel dispersion sheet for this patient.   Return office visit   3 months                  Told patient to return for periodic foot care and evaluation due to potential at risk complications.   Gardiner Barefoot DPM

## 2020-11-27 ENCOUNTER — Ambulatory Visit: Payer: Medicare Other | Admitting: Podiatry

## 2021-07-04 ENCOUNTER — Telehealth: Payer: Self-pay

## 2021-07-04 NOTE — Telephone Encounter (Signed)
Spoke with referring office regarding patient and advised Dr Loanne Drilling was leaving the practice.  Patient is schedueld with Dr Hartford Poli. ?
